# Patient Record
Sex: Female | Born: 1935
Health system: Southern US, Community
[De-identification: ages and names within clinical notes are randomized; demographics above are authoritative.]

## PROBLEM LIST (undated history)

## (undated) DIAGNOSIS — M810 Age-related osteoporosis without current pathological fracture: Secondary | ICD-10-CM

## (undated) DIAGNOSIS — J45909 Unspecified asthma, uncomplicated: Secondary | ICD-10-CM

## (undated) DIAGNOSIS — M25511 Pain in right shoulder: Secondary | ICD-10-CM

## (undated) DIAGNOSIS — T7840XA Allergy, unspecified, initial encounter: Secondary | ICD-10-CM

## (undated) DIAGNOSIS — C801 Malignant (primary) neoplasm, unspecified: Secondary | ICD-10-CM

## (undated) DIAGNOSIS — S43439A Superior glenoid labrum lesion of unspecified shoulder, initial encounter: Secondary | ICD-10-CM

## (undated) DIAGNOSIS — H9190 Unspecified hearing loss, unspecified ear: Secondary | ICD-10-CM

## (undated) DIAGNOSIS — E039 Hypothyroidism, unspecified: Secondary | ICD-10-CM

## (undated) DIAGNOSIS — Z853 Personal history of malignant neoplasm of breast: Secondary | ICD-10-CM

## (undated) HISTORY — PX: MASTECTOMY: SHX3

## (undated) HISTORY — DX: Allergy, unspecified, initial encounter: T78.40XA

## (undated) HISTORY — PX: CATARACT EXTRACTION, BILATERAL: SHX1313

## (undated) HISTORY — DX: Age-related osteoporosis without current pathological fracture: M81.0

---

## 1998-12-02 ENCOUNTER — Other Ambulatory Visit: Admission: RE | Admit: 1998-12-02 | Discharge: 1998-12-02 | Payer: Self-pay | Admitting: *Deleted

## 1999-11-17 ENCOUNTER — Ambulatory Visit (HOSPITAL_COMMUNITY): Admission: RE | Admit: 1999-11-17 | Discharge: 1999-11-17 | Payer: Self-pay | Admitting: Family Medicine

## 1999-11-17 ENCOUNTER — Encounter: Payer: Self-pay | Admitting: Family Medicine

## 1999-12-06 ENCOUNTER — Other Ambulatory Visit: Admission: RE | Admit: 1999-12-06 | Discharge: 1999-12-06 | Payer: Self-pay | Admitting: Family Medicine

## 1999-12-16 ENCOUNTER — Encounter: Payer: Self-pay | Admitting: Family Medicine

## 1999-12-16 ENCOUNTER — Ambulatory Visit (HOSPITAL_COMMUNITY): Admission: RE | Admit: 1999-12-16 | Discharge: 1999-12-16 | Payer: Self-pay | Admitting: Family Medicine

## 1999-12-19 ENCOUNTER — Encounter (INDEPENDENT_AMBULATORY_CARE_PROVIDER_SITE_OTHER): Payer: Self-pay

## 1999-12-19 ENCOUNTER — Ambulatory Visit (HOSPITAL_COMMUNITY): Admission: RE | Admit: 1999-12-19 | Discharge: 1999-12-19 | Payer: Self-pay | Admitting: Surgery

## 1999-12-19 HISTORY — PX: OTHER SURGICAL HISTORY: SHX169

## 2000-03-20 ENCOUNTER — Encounter: Payer: Self-pay | Admitting: Family Medicine

## 2000-03-20 ENCOUNTER — Ambulatory Visit (HOSPITAL_COMMUNITY): Admission: RE | Admit: 2000-03-20 | Discharge: 2000-03-20 | Payer: Self-pay | Admitting: Family Medicine

## 2002-01-28 ENCOUNTER — Encounter (HOSPITAL_COMMUNITY): Payer: Self-pay | Admitting: Oncology

## 2002-01-28 ENCOUNTER — Ambulatory Visit (HOSPITAL_COMMUNITY): Admission: RE | Admit: 2002-01-28 | Discharge: 2002-01-28 | Payer: Self-pay | Admitting: Oncology

## 2003-05-21 ENCOUNTER — Ambulatory Visit (HOSPITAL_COMMUNITY): Admission: RE | Admit: 2003-05-21 | Discharge: 2003-05-21 | Payer: Self-pay | Admitting: Gastroenterology

## 2005-03-08 ENCOUNTER — Ambulatory Visit: Payer: Self-pay | Admitting: Oncology

## 2006-03-06 ENCOUNTER — Ambulatory Visit: Payer: Self-pay | Admitting: Oncology

## 2007-03-11 ENCOUNTER — Ambulatory Visit: Payer: Self-pay | Admitting: Oncology

## 2007-03-14 LAB — CBC WITH DIFFERENTIAL/PLATELET
Basophils Absolute: 0 10*3/uL (ref 0.0–0.1)
Eosinophils Absolute: 0.1 10*3/uL (ref 0.0–0.5)
HGB: 12.9 g/dL (ref 11.6–15.9)
LYMPH%: 40.4 % (ref 14.0–48.0)
MCV: 85.5 fL (ref 81.0–101.0)
MONO#: 0.5 10*3/uL (ref 0.1–0.9)
MONO%: 8.8 % (ref 0.0–13.0)
NEUT#: 2.8 10*3/uL (ref 1.5–6.5)
Platelets: 233 10*3/uL (ref 145–400)
WBC: 5.7 10*3/uL (ref 3.9–10.0)

## 2007-03-15 LAB — LACTATE DEHYDROGENASE: LDH: 184 U/L (ref 94–250)

## 2007-03-15 LAB — COMPREHENSIVE METABOLIC PANEL
Albumin: 4.1 g/dL (ref 3.5–5.2)
Alkaline Phosphatase: 46 U/L (ref 39–117)
BUN: 18 mg/dL (ref 6–23)
CO2: 27 mEq/L (ref 19–32)
Glucose, Bld: 68 mg/dL — ABNORMAL LOW (ref 70–99)
Potassium: 4.6 mEq/L (ref 3.5–5.3)
Sodium: 142 mEq/L (ref 135–145)
Total Protein: 6.7 g/dL (ref 6.0–8.3)

## 2007-03-17 ENCOUNTER — Emergency Department (HOSPITAL_COMMUNITY): Admission: EM | Admit: 2007-03-17 | Discharge: 2007-03-17 | Payer: Self-pay | Admitting: Family Medicine

## 2007-06-13 HISTORY — PX: OTHER SURGICAL HISTORY: SHX169

## 2007-12-24 ENCOUNTER — Emergency Department (HOSPITAL_COMMUNITY): Admission: EM | Admit: 2007-12-24 | Discharge: 2007-12-24 | Payer: Self-pay | Admitting: Emergency Medicine

## 2007-12-25 ENCOUNTER — Encounter: Admission: RE | Admit: 2007-12-25 | Discharge: 2007-12-25 | Payer: Self-pay | Admitting: Orthopedic Surgery

## 2008-03-18 ENCOUNTER — Ambulatory Visit: Payer: Self-pay | Admitting: Oncology

## 2008-03-20 LAB — CBC WITH DIFFERENTIAL/PLATELET
Basophils Absolute: 0 10*3/uL (ref 0.0–0.1)
EOS%: 1.7 % (ref 0.0–7.0)
HCT: 39.1 % (ref 34.8–46.6)
HGB: 13.3 g/dL (ref 11.6–15.9)
LYMPH%: 44.4 % (ref 14.0–48.0)
MCH: 29.5 pg (ref 26.0–34.0)
MCV: 86.6 fL (ref 81.0–101.0)
MONO%: 7.4 % (ref 0.0–13.0)
NEUT%: 46.3 % (ref 39.6–76.8)
Platelets: 229 10*3/uL (ref 145–400)
RDW: 13.3 % (ref 11.3–14.5)

## 2008-03-20 LAB — COMPREHENSIVE METABOLIC PANEL
AST: 22 U/L (ref 0–37)
Alkaline Phosphatase: 48 U/L (ref 39–117)
BUN: 17 mg/dL (ref 6–23)
Creatinine, Ser: 0.93 mg/dL (ref 0.40–1.20)
Glucose, Bld: 74 mg/dL (ref 70–99)
Total Bilirubin: 0.5 mg/dL (ref 0.3–1.2)

## 2008-04-08 ENCOUNTER — Encounter: Admission: RE | Admit: 2008-04-08 | Discharge: 2008-06-11 | Payer: Self-pay | Admitting: Orthopedic Surgery

## 2008-06-12 ENCOUNTER — Encounter: Admission: RE | Admit: 2008-06-12 | Discharge: 2008-09-10 | Payer: Self-pay | Admitting: Orthopedic Surgery

## 2009-03-16 ENCOUNTER — Ambulatory Visit: Payer: Self-pay | Admitting: Oncology

## 2009-03-18 LAB — CBC WITH DIFFERENTIAL/PLATELET
BASO%: 0.1 % (ref 0.0–2.0)
EOS%: 0.6 % (ref 0.0–7.0)
LYMPH%: 44 % (ref 14.0–49.7)
MCHC: 33.8 g/dL (ref 31.5–36.0)
MONO#: 0.5 10*3/uL (ref 0.1–0.9)
Platelets: 215 10*3/uL (ref 145–400)
RBC: 4.47 10*6/uL (ref 3.70–5.45)
WBC: 6.8 10*3/uL (ref 3.9–10.3)
lymph#: 3 10*3/uL (ref 0.9–3.3)
nRBC: 0 % (ref 0–0)

## 2009-03-18 LAB — COMPREHENSIVE METABOLIC PANEL
ALT: 17 U/L (ref 0–35)
AST: 23 U/L (ref 0–37)
Alkaline Phosphatase: 47 U/L (ref 39–117)
Calcium: 10 mg/dL (ref 8.4–10.5)
Chloride: 106 mEq/L (ref 96–112)
Creatinine, Ser: 0.99 mg/dL (ref 0.40–1.20)

## 2009-11-16 ENCOUNTER — Encounter: Admission: RE | Admit: 2009-11-16 | Discharge: 2009-11-16 | Payer: Self-pay | Admitting: Family Medicine

## 2010-03-16 ENCOUNTER — Ambulatory Visit: Payer: Self-pay | Admitting: Oncology

## 2010-03-18 LAB — COMPREHENSIVE METABOLIC PANEL
Albumin: 4.2 g/dL (ref 3.5–5.2)
CO2: 26 mEq/L (ref 19–32)
Chloride: 106 mEq/L (ref 96–112)
Glucose, Bld: 84 mg/dL (ref 70–99)
Potassium: 4.2 mEq/L (ref 3.5–5.3)
Sodium: 142 mEq/L (ref 135–145)
Total Protein: 6.5 g/dL (ref 6.0–8.3)

## 2010-03-18 LAB — LACTATE DEHYDROGENASE: LDH: 200 U/L (ref 94–250)

## 2010-03-18 LAB — CBC WITH DIFFERENTIAL/PLATELET
Eosinophils Absolute: 0.1 10*3/uL (ref 0.0–0.5)
MONO#: 0.7 10*3/uL (ref 0.1–0.9)
NEUT#: 4.5 10*3/uL (ref 1.5–6.5)
Platelets: 230 10*3/uL (ref 145–400)
RBC: 4.44 10*6/uL (ref 3.70–5.45)
RDW: 13.8 % (ref 11.2–14.5)
WBC: 7.6 10*3/uL (ref 3.9–10.3)

## 2010-10-12 ENCOUNTER — Other Ambulatory Visit: Payer: Self-pay | Admitting: Internal Medicine

## 2010-10-12 ENCOUNTER — Other Ambulatory Visit (HOSPITAL_COMMUNITY): Payer: Self-pay | Admitting: Oncology

## 2010-10-12 DIAGNOSIS — Z1231 Encounter for screening mammogram for malignant neoplasm of breast: Secondary | ICD-10-CM

## 2010-10-28 NOTE — Op Note (Signed)
NAME:  Sarah Boyd, Sarah Boyd                        ACCOUNT NO.:  [id number]   MEDICAL RECORD NO.:  [id number]                   PATIENT TYPE:  AMB   LOCATION:  ENDO                                 FACILITY:  [facility]   PHYSICIAN:  John C. Madilyn Fireman, M.D.                 DATE OF BIRTH:  1930-03-20   DATE OF PROCEDURE:  05/21/2003  DATE OF DISCHARGE:                                 OPERATIVE REPORT   PROCEDURE:  Colonoscopy.   INDICATION FOR PROCEDURE:  Personal history of breast cancer and a family  history of colon cancer in a second degree relative.   DESCRIPTION OF PROCEDURE:  The patient was placed in the left lateral  decubitus position and placed on the pulse monitor with continuous low-flow  oxygen delivered by nasal cannula.  She was sedated with 50 mcg IV fentanyl  and 4 mg IV Versed.  The Olympus video colonoscope was inserted into the  rectum and advanced to the cecum, confirmed by transillumination at  McBurney's point and visualization of the ileocecal valve and appendiceal  orifice.  The prep was excellent.  The cecum, ascending, transverse,  descending, and sigmoid colon all appeared normal with no masses, polyps,  diverticula, or other mucosal abnormalities.  The rectum likewise appeared  normal and retroflex view of the anus revealed no obvious internal  hemorrhoids.  The scope was then withdrawn and the patient returned to the  recovery room in stable condition.  She tolerated the procedure well, and  there were no immediate complications.   IMPRESSION:  Normal colonoscopy.   PLAN:  Based on her risk factors, will repeat study in five years.                                               John C. Madilyn Fireman, M.D.    JCH/MEDQ  D:  05/21/2003  T:  05/21/2003  Job:  [id number]   cc:   Magnus Sinning. Dimple Casey, M.D.  [address] [place]  [place] [address]  Fax: [phone]

## 2010-10-28 NOTE — Op Note (Signed)
[facility]  Patient:    Sarah Boyd, Sarah Boyd                       MRN: [id number] Proc. Date: 12/19/99 Attending:  Abigail Miyamoto, M.D.                           Operative Report  PREOPERATIVE DIAGNOSIS:  Right chest mass.  POSTOPERATIVE DIAGNOSIS:  Right chest mass.  OPERATION PERFORMED:  Excision of right chest mass.  SURGEON:  Abigail Miyamoto, M.D.  ANESTHESIA:  1% lidocaine with epinephrine and 2 mg of IV Versed.  ESTIMATED BLOOD LOSS:  Minimal.  HISTORY:  Sarah Boyd is a pleasant lady who is status post a right mastectomy back in 1991 for invasive breast cancer in which she had 14 positive nodes.  She received radiation and chemotherapy after this and has since done well.  She was found on physical examination to have a keloid at the lateral aspect of the chest incision which had been followed for some time.  However, due to some change in skin color, her oncologist has requested excision of this area to rule out a recurrence.  DESCRIPTION OF PROCEDURE:  The patient was brought to the minor procedure room, identified as Sarah Boyd.  She was placed supine on the operating table and then anesthesia was induced.  Her right chest was then prepped and draped in the usual sterile fashion.  The skin around the lateral aspect of the old incision was then anesthetized with 1% lidocaine.  An elliptical incision was then made removing the scar tissue at the lateral aspect of the incision.  The entire specimen was then removed with a scalpel and sent to pathology for identification.  Hemostasis was then achieved with the cautery. The subcutaneous layers were closed with interrupted 3-0 Vicryl suture and the skin was closed with running 4-0 Monocryl.  Steri-Strips, gauze and tape were then applied.  The patient tolerated the procedure well.  Sponge, needle and instrument counts were correct at the end of the procedure.  The patient was taken in  stable condition from the operating room back to the recovery room. DD:  12/19/99 TD:  12/19/99 Job: [id number]

## 2010-11-18 ENCOUNTER — Ambulatory Visit: Payer: Self-pay

## 2010-11-28 ENCOUNTER — Ambulatory Visit
Admission: RE | Admit: 2010-11-28 | Discharge: 2010-11-28 | Disposition: A | Discharge status: Home / Self Care | Payer: Medicare Other | Source: Ambulatory Visit | Attending: Oncology | Admitting: Oncology

## 2010-11-28 ENCOUNTER — Other Ambulatory Visit (HOSPITAL_COMMUNITY): Payer: Self-pay | Admitting: Oncology

## 2010-11-28 DIAGNOSIS — Z1231 Encounter for screening mammogram for malignant neoplasm of breast: Secondary | ICD-10-CM

## 2011-03-23 LAB — I-STAT 8, (EC8 V) (CONVERTED LAB)
Acid-Base Excess: 1
Bicarbonate: 28.3 — ABNORMAL HIGH
Glucose, Bld: 91
TCO2: 30
pCO2, Ven: 53.5 — ABNORMAL HIGH
pH, Ven: 7.332 — ABNORMAL HIGH

## 2011-03-23 LAB — POCT URINALYSIS DIP (DEVICE)
Glucose, UA: NEGATIVE
Nitrite: NEGATIVE
Operator id: 235561
Protein, ur: NEGATIVE
Specific Gravity, Urine: 1.01
Urobilinogen, UA: 0.2

## 2011-03-23 LAB — POCT I-STAT CREATININE
Creatinine, Ser: 1.2
Operator id: 235561

## 2011-10-24 ENCOUNTER — Other Ambulatory Visit: Payer: Self-pay | Admitting: Oncology

## 2011-10-24 DIAGNOSIS — Z9011 Acquired absence of right breast and nipple: Secondary | ICD-10-CM

## 2011-10-24 DIAGNOSIS — Z1231 Encounter for screening mammogram for malignant neoplasm of breast: Secondary | ICD-10-CM

## 2011-11-28 ENCOUNTER — Other Ambulatory Visit: Payer: Self-pay | Admitting: Orthopedic Surgery

## 2011-11-29 ENCOUNTER — Ambulatory Visit: Payer: Medicare Other

## 2011-11-29 ENCOUNTER — Encounter (HOSPITAL_BASED_OUTPATIENT_CLINIC_OR_DEPARTMENT_OTHER): Payer: Self-pay | Admitting: *Deleted

## 2011-11-29 NOTE — Progress Notes (Signed)
NPO AFTER MN. ARRIVES AT 0830. NEEDS HG , CXR,  AND EKG. WILL TAKE LEVOTHYROXINE AND DO ADVAIR AM OF SURG. W/ SIP OF WATER. WILL BRING RESCUE INHALER.

## 2011-11-30 ENCOUNTER — Ambulatory Visit
Admission: RE | Admit: 2011-11-30 | Discharge: 2011-11-30 | Disposition: A | Discharge status: Home / Self Care | Payer: Medicare Other | Source: Ambulatory Visit | Attending: Oncology | Admitting: Oncology

## 2011-11-30 DIAGNOSIS — Z9011 Acquired absence of right breast and nipple: Secondary | ICD-10-CM

## 2011-11-30 DIAGNOSIS — Z1231 Encounter for screening mammogram for malignant neoplasm of breast: Secondary | ICD-10-CM

## 2011-12-01 ENCOUNTER — Telehealth: Payer: Self-pay

## 2011-12-01 NOTE — Telephone Encounter (Signed)
S/w pt that her mammogram showed something suspicious and it would be faxed to her PCP.

## 2011-12-01 NOTE — Telephone Encounter (Signed)
Mammogram faxed to [facility].

## 2011-12-04 ENCOUNTER — Other Ambulatory Visit: Payer: Self-pay | Admitting: Oncology

## 2011-12-04 DIAGNOSIS — R928 Other abnormal and inconclusive findings on diagnostic imaging of breast: Secondary | ICD-10-CM

## 2011-12-05 ENCOUNTER — Ambulatory Visit
Admission: RE | Admit: 2011-12-05 | Discharge: 2011-12-05 | Disposition: A | Discharge status: Home / Self Care | Payer: Medicare Other | Source: Ambulatory Visit | Attending: Oncology | Admitting: Oncology

## 2011-12-05 DIAGNOSIS — R928 Other abnormal and inconclusive findings on diagnostic imaging of breast: Secondary | ICD-10-CM

## 2011-12-06 ENCOUNTER — Encounter (HOSPITAL_BASED_OUTPATIENT_CLINIC_OR_DEPARTMENT_OTHER)
Admission: RE | Disposition: A | Discharge status: Home / Self Care | Payer: Self-pay | Source: Ambulatory Visit | Attending: Orthopedic Surgery

## 2011-12-06 ENCOUNTER — Encounter (HOSPITAL_BASED_OUTPATIENT_CLINIC_OR_DEPARTMENT_OTHER): Payer: Self-pay | Admitting: Anesthesiology

## 2011-12-06 ENCOUNTER — Encounter (HOSPITAL_BASED_OUTPATIENT_CLINIC_OR_DEPARTMENT_OTHER): Payer: Self-pay | Admitting: *Deleted

## 2011-12-06 ENCOUNTER — Ambulatory Visit (HOSPITAL_BASED_OUTPATIENT_CLINIC_OR_DEPARTMENT_OTHER): Payer: Medicare Other | Admitting: Anesthesiology

## 2011-12-06 ENCOUNTER — Ambulatory Visit (HOSPITAL_BASED_OUTPATIENT_CLINIC_OR_DEPARTMENT_OTHER)
Admission: RE | Admit: 2011-12-06 | Discharge: 2011-12-06 | Disposition: A | Discharge status: Home / Self Care | Payer: Medicare Other | Source: Ambulatory Visit | Attending: Orthopedic Surgery | Admitting: Orthopedic Surgery

## 2011-12-06 DIAGNOSIS — M719 Bursopathy, unspecified: Secondary | ICD-10-CM | POA: Insufficient documentation

## 2011-12-06 DIAGNOSIS — M67919 Unspecified disorder of synovium and tendon, unspecified shoulder: Secondary | ICD-10-CM | POA: Insufficient documentation

## 2011-12-06 DIAGNOSIS — Z9010 Acquired absence of unspecified breast and nipple: Secondary | ICD-10-CM | POA: Insufficient documentation

## 2011-12-06 DIAGNOSIS — Z79899 Other long term (current) drug therapy: Secondary | ICD-10-CM | POA: Insufficient documentation

## 2011-12-06 DIAGNOSIS — M19019 Primary osteoarthritis, unspecified shoulder: Secondary | ICD-10-CM | POA: Insufficient documentation

## 2011-12-06 DIAGNOSIS — Z853 Personal history of malignant neoplasm of breast: Secondary | ICD-10-CM | POA: Insufficient documentation

## 2011-12-06 DIAGNOSIS — M24119 Other articular cartilage disorders, unspecified shoulder: Secondary | ICD-10-CM | POA: Insufficient documentation

## 2011-12-06 DIAGNOSIS — E039 Hypothyroidism, unspecified: Secondary | ICD-10-CM | POA: Insufficient documentation

## 2011-12-06 HISTORY — DX: Personal history of malignant neoplasm of breast: Z85.3

## 2011-12-06 HISTORY — DX: Pain in right shoulder: M25.511

## 2011-12-06 HISTORY — DX: Unspecified asthma, uncomplicated: J45.909

## 2011-12-06 HISTORY — PX: SHOULDER ARTHROSCOPY: SHX128

## 2011-12-06 HISTORY — DX: Superior glenoid labrum lesion of unspecified shoulder, initial encounter: S43.439A

## 2011-12-06 HISTORY — DX: Hypothyroidism, unspecified: E03.9

## 2011-12-06 HISTORY — DX: Unspecified hearing loss, unspecified ear: H91.90

## 2011-12-06 SURGERY — ARTHROSCOPY, SHOULDER
Anesthesia: General | Site: Shoulder | Laterality: Right | Wound class: Clean

## 2011-12-06 MED ORDER — ALENDRONATE SODIUM 70 MG PO TABS: 70.0000 mg | ORAL_TABLET | ORAL | Status: DC

## 2011-12-06 MED ORDER — LIDOCAINE HCL (CARDIAC) 20 MG/ML IV SOLN
INTRAVENOUS | Status: DC | PRN
  Administered 2011-12-06: 60 mg via INTRAVENOUS

## 2011-12-06 MED ORDER — DEXAMETHASONE SODIUM PHOSPHATE 4 MG/ML IJ SOLN
INTRAMUSCULAR | Status: DC | PRN
  Administered 2011-12-06: 5 mg via INTRAVENOUS

## 2011-12-06 MED ORDER — POVIDONE-IODINE 7.5 % EX SOLN: Freq: Once | CUTANEOUS | Status: DC

## 2011-12-06 MED ORDER — SODIUM CHLORIDE 0.9 % IR SOLN
Status: DC | PRN
  Administered 2011-12-06: 6000 mL via INTRAVESICAL

## 2011-12-06 MED ORDER — FENTANYL CITRATE 0.05 MG/ML IJ SOLN
INTRAMUSCULAR | Status: DC | PRN
  Administered 2011-12-06: 50 ug via INTRAVENOUS

## 2011-12-06 MED ORDER — EPINEPHRINE HCL 1 MG/ML IJ SOLN
INTRAMUSCULAR | Status: DC | PRN
  Administered 2011-12-06: 2 mg

## 2011-12-06 MED ORDER — ROPIVACAINE HCL 5 MG/ML IJ SOLN
INTRAMUSCULAR | Status: DC | PRN
  Administered 2011-12-06: 20 mL via EPIDURAL

## 2011-12-06 MED ORDER — PROPOFOL 10 MG/ML IV EMUL
INTRAVENOUS | Status: DC | PRN
  Administered 2011-12-06: 100 mg via INTRAVENOUS

## 2011-12-06 MED ORDER — SODIUM CHLORIDE 0.9 % IV SOLN
10.0000 mg | INTRAVENOUS | Status: DC | PRN
  Administered 2011-12-06: 20 ug/min via INTRAVENOUS

## 2011-12-06 MED ORDER — MONTELUKAST SODIUM 10 MG PO TABS: 10.0000 mg | ORAL_TABLET | Freq: Every day | ORAL | Status: DC

## 2011-12-06 MED ORDER — PHENYLEPHRINE HCL 10 MG/ML IJ SOLN
INTRAMUSCULAR | Status: DC | PRN
  Administered 2011-12-06 (×5): 40 ug via INTRAVENOUS

## 2011-12-06 MED ORDER — BUPIVACAINE-EPINEPHRINE 0.5% -1:200000 IJ SOLN
INTRAMUSCULAR | Status: DC | PRN
  Administered 2011-12-06: 10 mL

## 2011-12-06 MED ORDER — FLUTICASONE-SALMETEROL 100-50 MCG/DOSE IN AEPB: 1.0000 | INHALATION_SPRAY | Freq: Two times a day (BID) | RESPIRATORY_TRACT | Status: DC | PRN

## 2011-12-06 MED ORDER — METHOCARBAMOL 500 MG PO TABS: 500.0000 mg | ORAL_TABLET | Freq: Four times a day (QID) | ORAL | Status: AC | PRN

## 2011-12-06 MED ORDER — PROMETHAZINE HCL 25 MG/ML IJ SOLN: 6.2500 mg | INTRAMUSCULAR | Status: DC | PRN

## 2011-12-06 MED ORDER — SODIUM CHLORIDE 0.9 % IR SOLN
Status: DC | PRN
  Administered 2011-12-06: 11:00:00

## 2011-12-06 MED ORDER — MIDAZOLAM HCL 2 MG/2ML IJ SOLN
1.0000 mg | Freq: Once | INTRAMUSCULAR | Status: AC
  Administered 2011-12-06: 1 mg via INTRAVENOUS

## 2011-12-06 MED ORDER — ONDANSETRON HCL 4 MG/2ML IJ SOLN
INTRAMUSCULAR | Status: DC | PRN
  Administered 2011-12-06: 4 mg via INTRAVENOUS

## 2011-12-06 MED ORDER — LACTATED RINGERS IV SOLN
INTRAVENOUS | Status: DC
  Administered 2011-12-06 (×2): via INTRAVENOUS

## 2011-12-06 MED ORDER — FENTANYL CITRATE 0.05 MG/ML IJ SOLN
25.0000 ug | INTRAMUSCULAR | Status: DC | PRN
  Administered 2011-12-06: 25 ug via INTRAVENOUS

## 2011-12-06 MED ORDER — SUCCINYLCHOLINE CHLORIDE 20 MG/ML IJ SOLN
INTRAMUSCULAR | Status: DC | PRN
  Administered 2011-12-06: 60 mg via INTRAVENOUS

## 2011-12-06 MED ORDER — FENTANYL CITRATE 0.05 MG/ML IJ SOLN: 25.0000 ug | Freq: Once | INTRAMUSCULAR | Status: DC

## 2011-12-06 MED ORDER — LEVOTHYROXINE SODIUM 25 MCG PO TABS: 25.0000 ug | ORAL_TABLET | Freq: Every morning | ORAL | Status: DC

## 2011-12-06 MED ORDER — OXYCODONE-ACETAMINOPHEN 5-325 MG PO TABS: 1.0000 | ORAL_TABLET | ORAL | Status: AC | PRN

## 2011-12-06 MED ORDER — LACTATED RINGERS IV SOLN: INTRAVENOUS | Status: DC

## 2011-12-06 SURGICAL SUPPLY — 77 items
APL SKNCLS STERI-STRIP NONHPOA (GAUZE/BANDAGES/DRESSINGS)
BENZOIN TINCTURE PRP APPL 2/3 (GAUZE/BANDAGES/DRESSINGS) IMPLANT
BLADE 4.2CUDA (BLADE) ×2 IMPLANT
BLADE CUDA 4.2 (BLADE) IMPLANT
BLADE CUDA 5.5 (BLADE) IMPLANT
BLADE CUDA SHAVER 3.5 (BLADE) IMPLANT
BLADE CUTTER GATOR 3.5 (BLADE) IMPLANT
BLADE FLAT COURSE (BLADE) IMPLANT
BLADE GREAT WHITE 4.2 (BLADE) IMPLANT
BLADE SURG 10 STRL SS (BLADE) ×2 IMPLANT
BLADE SURG 15 STRL LF DISP TIS (BLADE) ×1 IMPLANT
BLADE SURG 15 STRL SS (BLADE) ×2
BUR OVAL 4.0 (BURR) ×2 IMPLANT
CANISTER SUCT LVC 12 LTR MEDI- (MISCELLANEOUS) ×4 IMPLANT
CANISTER SUCTION 1200CC (MISCELLANEOUS) ×2 IMPLANT
CLOTH BEACON ORANGE TIMEOUT ST (SAFETY) ×2 IMPLANT
DRAPE LG THREE QUARTER DISP (DRAPES) ×4 IMPLANT
DRAPE SHOULDER BEACH CHAIR (DRAPES) ×2 IMPLANT
DRAPE U-SHAPE 47X51 STRL (DRAPES) ×2 IMPLANT
DRSG ADAPTIC 3X8 NADH LF (GAUZE/BANDAGES/DRESSINGS) ×1 IMPLANT
DRSG EMULSION OIL 3X3 NADH (GAUZE/BANDAGES/DRESSINGS) ×1 IMPLANT
DRSG PAD ABDOMINAL 8X10 ST (GAUZE/BANDAGES/DRESSINGS) ×2 IMPLANT
DURAPREP 26ML APPLICATOR (WOUND CARE) ×2 IMPLANT
ELECT MENISCUS 165MM 90D (ELECTRODE) IMPLANT
ELECT REM PT RETURN 9FT ADLT (ELECTROSURGICAL) ×2
ELECTRODE REM PT RTRN 9FT ADLT (ELECTROSURGICAL) ×1 IMPLANT
GLOVE BIOGEL PI IND STRL 8 (GLOVE) ×1 IMPLANT
GLOVE BIOGEL PI INDICATOR 8 (GLOVE) ×1
GLOVE ECLIPSE 6.5 STRL STRAW (GLOVE) ×1 IMPLANT
GLOVE ECLIPSE 8.0 STRL XLNG CF (GLOVE) ×4 IMPLANT
GLOVE INDICATOR 6.5 STRL GRN (GLOVE) ×1 IMPLANT
GLOVE INDICATOR 8.0 STRL GRN (GLOVE) ×4 IMPLANT
GOWN PREVENTION PLUS LG XLONG (DISPOSABLE) ×2 IMPLANT
GOWN STRL REIN XL XLG (GOWN DISPOSABLE) ×4 IMPLANT
GOWN SURGICAL LARGE (GOWNS) ×1 IMPLANT
IV NS IRRIG 3000ML ARTHROMATIC (IV SOLUTION) ×4 IMPLANT
NDL 1/2 CIR CATGUT .05X1.09 (NEEDLE) IMPLANT
NDL HYPO 18GX1.5 BLUNT FILL (NEEDLE) ×1 IMPLANT
NDL SAFETY ECLIPSE 18X1.5 (NEEDLE) ×1 IMPLANT
NEEDLE 1/2 CIR CATGUT .05X1.09 (NEEDLE) IMPLANT
NEEDLE HYPO 18GX1.5 BLUNT FILL (NEEDLE) ×2 IMPLANT
NEEDLE HYPO 18GX1.5 SHARP (NEEDLE) ×2
NEEDLE HYPO 22GX1.5 SAFETY (NEEDLE) ×2 IMPLANT
NS IRRIG 500ML POUR BTL (IV SOLUTION) ×2 IMPLANT
PACK ARTHROSCOPY DSU (CUSTOM PROCEDURE TRAY) ×2 IMPLANT
PACK BASIN DAY SURGERY FS (CUSTOM PROCEDURE TRAY) ×2 IMPLANT
PENCIL BUTTON HOLSTER BLD 10FT (ELECTRODE) IMPLANT
SET ARTHROSCOPY TUBING (MISCELLANEOUS) ×2
SET ARTHROSCOPY TUBING LN (MISCELLANEOUS) ×1 IMPLANT
SLING ARM IMMOBILIZER LRG (SOFTGOODS) IMPLANT
SLING ARM IMMOBILIZER MED (SOFTGOODS) ×1 IMPLANT
SPONGE GAUZE 4X4 12PLY (GAUZE/BANDAGES/DRESSINGS) ×2 IMPLANT
SPONGE SURGIFOAM ABS GEL 100 (HEMOSTASIS) IMPLANT
STAPLER VISISTAT 35W (STAPLE) ×2 IMPLANT
STRIP CLOSURE SKIN 1/2X4 (GAUZE/BANDAGES/DRESSINGS) IMPLANT
SUCTION FRAZIER TIP 10 FR DISP (SUCTIONS) ×2 IMPLANT
SUT BONE WAX W31G (SUTURE) IMPLANT
SUT ETHIBOND GREEN BRAID 0S 4 (SUTURE) IMPLANT
SUT ETHIBOND NAB CT1 #1 30IN (SUTURE) IMPLANT
SUT ETHILON 4 0 PS 2 18 (SUTURE) IMPLANT
SUT VIC AB 0 CT1 36 (SUTURE) IMPLANT
SUT VIC AB 1 CT1 36 (SUTURE) ×4 IMPLANT
SUT VIC AB 2-0 CT1 27 (SUTURE) ×4
SUT VIC AB 2-0 CT1 TAPERPNT 27 (SUTURE) ×2 IMPLANT
SUT VIC AB 3-0 CT1 27 (SUTURE)
SUT VIC AB 3-0 CT1 TAPERPNT 27 (SUTURE) IMPLANT
SUT VIC AB 3-0 SH 27 (SUTURE)
SUT VIC AB 3-0 SH 27X BRD (SUTURE) IMPLANT
SUT VICRYL 4-0 PS2 18IN ABS (SUTURE) IMPLANT
SYR BULB IRRIGATION 50ML (SYRINGE) ×2 IMPLANT
SYRINGE 10CC LL (SYRINGE) ×2 IMPLANT
TAPE CLOTH SURG 6X10 WHT LF (GAUZE/BANDAGES/DRESSINGS) ×1 IMPLANT
TAPE HYPAFIX 6X30 (GAUZE/BANDAGES/DRESSINGS) IMPLANT
TOWEL OR 17X24 6PK STRL BLUE (TOWEL DISPOSABLE) ×2 IMPLANT
TUBE CONNECTING 12X1/4 (SUCTIONS) ×2 IMPLANT
WAND 90 DEG TURBOVAC W/CORD (SURGICAL WAND) ×2 IMPLANT
WATER STERILE IRR 500ML POUR (IV SOLUTION) ×2 IMPLANT

## 2011-12-06 NOTE — Discharge Instructions (Signed)
Wear sling for comfort, use ice as needed. Call if problems. Post Anesthesia Home Care Instructions  Activity: Get plenty of rest for the remainder of the day. A responsible adult should stay with you for 24 hours following the procedure.  For the next 24 hours, DO NOT: -Drive a car -Advertising copywriter -Drink alcoholic beverages -Take any medication unless instructed by your physician -Make any legal decisions or sign important papers.  Meals: Start with liquid foods such as gelatin or soup. Progress to regular foods as tolerated. Avoid greasy, spicy, heavy foods. If nausea and/or vomiting occur, drink only clear liquids until the nausea and/or vomiting subsides. Call your physician if vomiting continues.  Special Instructions/Symptoms: Your throat may feel dry or sore from the anesthesia or the breathing tube placed in your throat during surgery. If this causes discomfort, gargle with warm salt water. The discomfort should disappear within 24 hours.  Regional Anesthesia Blocks  1. Numbness or the inability to move the "blocked" extremity may last from 3-48 hours after placement. The length of time depends on the medication injected and your individual response to the medication. If the numbness is not going away after 48 hours, call your surgeon.  2. The extremity that is blocked will need to be protected until the numbness is gone and the  Strength has returned. Because you cannot feel it, you will need to take extra care to avoid injury. Because it may be weak, you may have difficulty moving it or using it. You may not know what position it is in without looking at it while the block is in effect.  3. For blocks in the legs and feet, returning to weight bearing and walking needs to be done carefully. You will need to wait until the numbness is entirely gone and the strength has returned. You should be able to move your leg and foot normally before you try and bear weight or walk. You will  need someone to be with you when you first try to ensure you do not fall and possibly risk injury.  4. Bruising and tenderness at the needle site are common side effects and will resolve in a few days.  5. Persistent numbness or new problems with movement should be communicated to the surgeon or the [facility] [phone] [facility] [phone]).

## 2011-12-06 NOTE — Anesthesia Procedure Notes (Addendum)
Anesthesia Regional Block:  Interscalene brachial plexus block  Pre-Anesthetic Checklist: ,, timeout performed, Correct Patient, Correct Site, Correct Laterality, Correct Procedure, Correct Position, site marked, Risks and benefits discussed,  Surgical consent,  Pre-op evaluation,  At surgeon's request and post-op pain management   Prep: chloraprep       Needles:  Injection technique: Single-shot  Needle Type: Stimiplex     Needle Length: 5cm 5 cm     Additional Needles:  Procedures: ultrasound guided Interscalene brachial plexus block Narrative:  Start time: 12/06/2011 9:37 AM End time: 12/06/2011 9:45 AM Injection made incrementally with aspirations every 5 mL.  Performed by: Personally  Anesthesiologist: A Fortune MD  Additional Notes: Risks, benefits and alternative to block explained extensively.  Patient tolerated procedure well, without complications.  Interscalene brachial plexus block Procedure Name: Intubation Date/Time: 12/06/2011 10:13 AM Performed by: Norva Pavlov Pre-anesthesia Checklist: Patient identified, Emergency Drugs available, Suction available and Patient being monitored Patient Re-evaluated:Patient Re-evaluated prior to inductionOxygen Delivery Method: Circle System Utilized Preoxygenation: Pre-oxygenation with 100% oxygen Intubation Type: IV induction Ventilation: Mask ventilation without difficulty Laryngoscope Size: Mac and 3 Tube type: Oral Tube size: 6.5 mm Number of attempts: 1 Airway Equipment and Method: stylet,  oral airway and LTA kit utilized Placement Confirmation: ETT inserted through vocal cords under direct vision,  positive ETCO2 and breath sounds checked- equal and bilateral Tube secured with: Tape Dental Injury: Teeth and Oropharynx as per pre-operative assessment  Comments: LTA 80 mg lidocaine

## 2011-12-06 NOTE — H&P (Signed)
Sarah Boyd is an 78 y.o. female.   Chief Complaint: painful rt shoulder HPI:MRI demonstrates labral tear and partial rotator cuff tear  Past Medical History  Diagnosis Date  . Labral tear of shoulder RIGHT SHOULDER  . Asthma   . Hypothyroidism   . History of breast cancer S/P RIGHT MASTECTOMY AND CHEMO 21 YRS AGO (APPROX.  1990)    NO RECURRENCE  . Right shoulder pain   . HOH (hard of hearing) NO AIDS    Past Surgical History  Procedure Date  . Excision of right chest mass 12-19-1999    BENIGN  . Mastectomy 1990  (APPROX.)    RIGHT BREAST CANCER -- NO RECURRENCE  . Orif bilateral wrist 2009  . Cataract extraction, bilateral     History reviewed. No pertinent family history. Social History:  reports that she has never smoked. She has never used smokeless tobacco. She reports that she does not drink alcohol or use illicit drugs.  Allergies:  Allergies  Allergen Reactions  . Benadryl (Diphenhydramine Hcl) Other (See Comments)    ALTERED MENTAL STATIS  . Chocolate     Medications Prior to Admission  Medication Sig Dispense Refill  . alendronate (FOSAMAX) 70 MG tablet Take 70 mg by mouth every 7 (seven) days. Take with a full glass of water on an empty stomach.      . Fluticasone-Salmeterol (ADVAIR DISKUS IN) Inhale 1 puff into the lungs 2 (two) times daily.      Marland Kitchen levothyroxine (SYNTHROID, LEVOTHROID) 25 MCG tablet Take 25 mcg by mouth every morning.      . montelukast (SINGULAIR) 10 MG tablet Take 10 mg by mouth at bedtime.      . ALBUTEROL IN Inhale into the lungs as needed.        No results found for this or any previous visit (from the past 48 hour(s)). [employer]  12/05/2011  *RADIOLOGY REPORT*  Clinical Data:  The patient returns for evaluation of a possible asymmetry in the left breast noted on recent screening study dated 11/30/2011.  The patient underwent right mastectomy for breast cancer in 1991.  DIGITAL DIAGNOSTIC LEFT LIMITED MAMMOGRAM WITH  CAD  Comparison:  11/28/2010, 11/16/2009  Findings:  Additional views demonstrate no persistent mass or distortion in the left upper outer quadrant. Mammographic images were processed with CAD.  IMPRESSION: No persistent worrisome abnormality upon additional imaging of the left breast.  RECOMMENDATION: Yearly screening mammography is suggested.  BI-RADS CATEGORY 1:  Negative.  Original Report Authenticated By: [name], M.D.    ROS  Blood pressure 135/65, pulse 79, temperature 97.4 F (36.3 C), temperature source Oral, resp. rate 18, height 5\' 1"  (1.549 m), weight 44.453 kg (98 lb), SpO2 97.00%. Physical Exam  Constitutional: She is oriented to person, place, and time. She appears well-developed and well-nourished.  HENT:  Head: Normocephalic and atraumatic.  Right Ear: External ear normal.  Left Ear: External ear normal.  Nose: Nose normal.  Mouth/Throat: Oropharynx is clear and moist.  Eyes: Conjunctivae and EOM are normal. Pupils are equal, round, and reactive to light.  Neck: Normal range of motion. Neck supple.  Cardiovascular: Normal rate, regular rhythm, normal heart sounds and intact distal pulses.   Respiratory: Effort normal and breath sounds normal.  GI: Soft. Bowel sounds are normal.  Musculoskeletal: Normal range of motion.       She has had an interscalene block  Neurological: She is alert and oriented to person, place,  and time. She has normal reflexes.  Skin: Skin is warm and dry.  Psychiatric: She has a normal mood and affect. Her behavior is normal. Judgment and thought content normal.     Assessment/Plan Painful rt shoulder due to labral and partial rotator cuff tears Rt shoulder arthroscopy with labral debridement and SAD  Willean Schurman P 12/06/2011, 9:51 AM

## 2011-12-06 NOTE — Brief Op Note (Signed)
12/06/2011  11:18 AM  PATIENT:  Sarah Boyd  78 y.o. female  PRE-OPERATIVE DIAGNOSIS:  RIGHT SHOULDER LABRIAL TEAR and partial rotator cuff tear  POST-OPERATIVE DIAGNOSIS:  RIGHT SHOULDER LABRIAL TEAR,partial rotator cuff tear, and glenohumeral arthritis  PROCEDURE:  Procedure(s) (LRB): ARTHROSCOPY SHOULDER (Right) with debridement labrum and subscapularis, subacromial decompression with shaving of rotator cuff  SURGEON:  Surgeon(s) and Role:    * Drucilla Schmidt, MD - Primary  PHYSICIAN ASSISTANT: Mr Idolina Primer [facility]  ASSISTANTS: nurse  ANESTHESIA:   regional and general  EBL:  Total I/O In: 1000 [I.V.:1000] Out: -   BLOOD ADMINISTERED:none  DRAINS: none   LOCAL MEDICATIONS USED:  MARCAINE     SPECIMEN:  No Specimen  DISPOSITION OF SPECIMEN:  PATHOLOGY  COUNTS:  YES  TOURNIQUET:  * No tourniquets in log *  DICTATION: .Other Dictation: Dictation Number [id number]  PLAN OF CARE: Discharge to home after PACU  PATIENT DISPOSITION:  PACU - hemodynamically stable.   Delay start of Pharmacological VTE agent (>24hrs) due to surgical blood loss or risk of bleeding: yes

## 2011-12-06 NOTE — Anesthesia Postprocedure Evaluation (Signed)
Anesthesia Post Note  Patient: Sarah Boyd  Procedure(s) Performed: Procedure(s) (LRB): ARTHROSCOPY SHOULDER (Right)  Anesthesia type: General  Patient location: PACU  Post pain: Pain level controlled  Post assessment: Post-op Vital signs reviewed  Last Vitals:  Filed Vitals:   12/06/11 1119  BP: 143/90  Pulse: 66  Temp: 35.9 C  Resp: 10    Post vital signs: Reviewed  Level of consciousness: sedated  Complications: No apparent anesthesia complications

## 2011-12-06 NOTE — Anesthesia Preprocedure Evaluation (Addendum)
Anesthesia Evaluation  Patient identified by MRN, date of birth, ID band Patient awake    Reviewed: Allergy & Precautions, H&P , NPO status , Patient's Chart, lab work & pertinent test results  Airway Mallampati: II TM Distance: >3 FB Neck ROM: Full    Dental  (+) Edentulous Upper, Partial Lower and Dental Advisory Given   Pulmonary asthma ,  breath sounds clear to auscultation  Pulmonary exam normal       Cardiovascular negative cardio ROS  Rhythm:Regular Rate:Normal     Neuro/Psych negative neurological ROS  negative psych ROS   GI/Hepatic negative GI ROS, Neg liver ROS,   Endo/Other  Hypothyroidism   Renal/GU negative Renal ROS  negative genitourinary   Musculoskeletal negative musculoskeletal ROS (+)   Abdominal   Peds  Hematology negative hematology ROS (+)   Anesthesia Other Findings   Reproductive/Obstetrics negative OB ROS                          Anesthesia Physical Anesthesia Plan  ASA: II  Anesthesia Plan: General   Post-op Pain Management:    Induction: Intravenous  Airway Management Planned: Oral ETT  Additional Equipment:   Intra-op Plan:   Post-operative Plan: Extubation in OR  Informed Consent: I have reviewed the patients History and Physical, chart, labs and discussed the procedure including the risks, benefits and alternatives for the proposed anesthesia with the patient or authorized representative who has indicated his/her understanding and acceptance.   Dental advisory given  Plan Discussed with: CRNA  Anesthesia Plan Comments:         Anesthesia Quick Evaluation

## 2011-12-06 NOTE — Transfer of Care (Signed)
Immediate Anesthesia Transfer of Care Note  Patient: Sarah Boyd  Procedure(s) Performed: Procedure(s) (LRB): ARTHROSCOPY SHOULDER (Right)  Patient Location: PACU  Anesthesia Type: General  Level of Consciousness: awake, alert  and oriented  Airway & Oxygen Therapy: Patient Spontanous Breathing and Patient connected to face mask oxygen  Post-op Assessment: Report given to PACU RN and Post -op Vital signs reviewed and stable  Post vital signs: Reviewed and stable  Complications: No apparent anesthesia complications

## 2011-12-07 ENCOUNTER — Encounter (HOSPITAL_BASED_OUTPATIENT_CLINIC_OR_DEPARTMENT_OTHER): Payer: Self-pay | Admitting: Orthopedic Surgery

## 2011-12-07 LAB — POCT HEMOGLOBIN-HEMACUE: Hemoglobin: 10.4 g/dL — ABNORMAL LOW (ref 12.0–15.0)

## 2011-12-07 NOTE — Progress Notes (Signed)
Message left ok per patient

## 2011-12-08 NOTE — Op Note (Signed)
Sarah Boyd, Sarah Boyd NO.:  [id number]  MEDICAL RECORD NO.:  [id number]  LOCATION:                               FACILITY:  [facility]  PHYSICIAN:  Marlowe Kays, M.D.  DATE OF BIRTH:  1930/03/11  DATE OF PROCEDURE:  12/06/2011 DATE OF DISCHARGE:                              OPERATIVE REPORT   PREOPERATIVE DIAGNOSES: 1. Labral tear. 2. Partial rotator cuff tear.  POSTOPERATIVE DIAGNOSES: 1. Labral tear. 2. Partial rotator cuff tear. 3. Glenohumeral arthritis.  OPERATION: 1. Right shoulder arthroscopy with debridement of labrum and     subscapularis. 2. Arthroscopic subacromial decompression with shaving of bursal     surface of rotator cuff.  SURGEON:  Marlowe Kays, MD  ASSISTANT:  Druscilla Brownie. Idolina Primer, PA-C  ANESTHESIA:  General preceded by interscalene block.  PATHOLOGY AND JUSTIFICATION FOR PROCEDURE:  Mr. Angie Fava assistance was necessary to help hold on rotate the arm and assist with the equipment.  MRI demonstrated the preoperative diagnoses.  PROCEDURE:  Interscalene block by Anesthesia, satisfied general anesthesia in the beach-chair position.  Right shoulder girdle was prepped with DuraPrep, draped in sterile field.  Time-out performed.  I marked out the anatomy of the shoulder and for hemostatic purposes injected the posterior portal, lateral portal, and around the coracoid. Through posterior soft spot portal, I atraumatically entered the glenohumeral joint with findings as noted above, which were good but degenerative tearing of the labrum, wear of the glenoid, and what appeared to be the degenerative arteries tearing of the subscapularis. I advanced the scope between the biceps tendon and subscapularis.  The biceps tendon and some minimal fraying, and using a switching stick, I made an anterior incision over the switching stick, and placed a metal cannula into the joint followed by 4.2 shaver debriding down the subscapularis and  the labrum with final pictures being taken.  I then redirected the scope to the subacromial space and through a lateral portal introduced a 4.2 shaver.  The bursal surface tear of the rotator cuff was identified and shaved down until smooth with the shaver.  She did not have a lot of bursal involvement, I did cleaned this up as necessary.  I then used the 90-degree ArthroCare vaporizer to remove soft tissue from the underneath surface of the acromion.  There was also a band in the anterior portion of the subacromial space, which I resected as well.  The [facility] joint area was identified, there was soft tissue there, but no bony impingement and I cleaned this up with the vaporizer as well.  I then used a 4 mm oval bur to begin burring down the underneath surface of the acromion and alternated back and forth between the bur and the vaporizer until we had a wide decompression.  At the conclusion of the case, there was no unusual bleeding.  All fluid possible was drained from subacromial space.  The 3 portals were closed with 4-0 nylon followed by Betadine, Adaptic, dry sterile dressing, and shoulder immobilizer.  She tolerated the procedure well, was taken to the recovery room in a satisfactory condition with no known complications.          ______________________________ Marlowe Kays,  M.D.     JA/MEDQ  D:  12/06/2011  T:  12/06/2011  Job:  [id number]

## 2011-12-27 ENCOUNTER — Ambulatory Visit: Payer: Medicare Other | Attending: Orthopedic Surgery | Admitting: Physical Therapy

## 2011-12-27 DIAGNOSIS — R5381 Other malaise: Secondary | ICD-10-CM | POA: Insufficient documentation

## 2011-12-27 DIAGNOSIS — IMO0001 Reserved for inherently not codable concepts without codable children: Secondary | ICD-10-CM | POA: Insufficient documentation

## 2011-12-27 DIAGNOSIS — M25519 Pain in unspecified shoulder: Secondary | ICD-10-CM | POA: Insufficient documentation

## 2011-12-27 DIAGNOSIS — M25619 Stiffness of unspecified shoulder, not elsewhere classified: Secondary | ICD-10-CM | POA: Insufficient documentation

## 2012-01-01 ENCOUNTER — Ambulatory Visit: Payer: Medicare Other | Admitting: Physical Therapy

## 2012-01-03 ENCOUNTER — Ambulatory Visit: Payer: Medicare Other | Admitting: Physical Therapy

## 2012-01-08 ENCOUNTER — Ambulatory Visit: Payer: Medicare Other | Admitting: Physical Therapy

## 2012-01-10 ENCOUNTER — Ambulatory Visit: Payer: Medicare Other | Admitting: Physical Therapy

## 2012-01-15 ENCOUNTER — Ambulatory Visit: Payer: Medicare Other | Attending: Orthopedic Surgery | Admitting: Physical Therapy

## 2012-01-15 DIAGNOSIS — M25519 Pain in unspecified shoulder: Secondary | ICD-10-CM | POA: Insufficient documentation

## 2012-01-15 DIAGNOSIS — R5381 Other malaise: Secondary | ICD-10-CM | POA: Insufficient documentation

## 2012-01-15 DIAGNOSIS — IMO0001 Reserved for inherently not codable concepts without codable children: Secondary | ICD-10-CM | POA: Insufficient documentation

## 2012-01-15 DIAGNOSIS — M25619 Stiffness of unspecified shoulder, not elsewhere classified: Secondary | ICD-10-CM | POA: Insufficient documentation

## 2012-01-17 ENCOUNTER — Ambulatory Visit: Payer: Medicare Other | Admitting: Physical Therapy

## 2012-01-22 ENCOUNTER — Ambulatory Visit: Payer: Medicare Other | Admitting: Physical Therapy

## 2012-01-24 ENCOUNTER — Ambulatory Visit: Payer: Medicare Other | Admitting: Physical Therapy

## 2012-01-30 ENCOUNTER — Ambulatory Visit: Payer: Medicare Other | Admitting: Physical Therapy

## 2012-02-01 ENCOUNTER — Ambulatory Visit: Payer: Medicare Other | Admitting: Physical Therapy

## 2012-02-06 ENCOUNTER — Ambulatory Visit: Payer: Medicare Other | Admitting: *Deleted

## 2012-02-08 ENCOUNTER — Ambulatory Visit: Payer: Medicare Other | Admitting: *Deleted

## 2012-02-13 ENCOUNTER — Ambulatory Visit: Payer: Medicare Other | Attending: Orthopedic Surgery | Admitting: Physical Therapy

## 2012-02-13 DIAGNOSIS — IMO0001 Reserved for inherently not codable concepts without codable children: Secondary | ICD-10-CM | POA: Insufficient documentation

## 2012-02-13 DIAGNOSIS — M25619 Stiffness of unspecified shoulder, not elsewhere classified: Secondary | ICD-10-CM | POA: Insufficient documentation

## 2012-02-13 DIAGNOSIS — R5381 Other malaise: Secondary | ICD-10-CM | POA: Insufficient documentation

## 2012-02-13 DIAGNOSIS — M25519 Pain in unspecified shoulder: Secondary | ICD-10-CM | POA: Insufficient documentation

## 2012-02-15 ENCOUNTER — Ambulatory Visit: Payer: Medicare Other | Admitting: Physical Therapy

## 2012-02-19 ENCOUNTER — Ambulatory Visit: Payer: Medicare Other | Admitting: Physical Therapy

## 2012-02-21 ENCOUNTER — Ambulatory Visit: Payer: Medicare Other | Admitting: *Deleted

## 2012-02-27 ENCOUNTER — Ambulatory Visit: Payer: Medicare Other | Admitting: Physical Therapy

## 2012-02-29 ENCOUNTER — Ambulatory Visit: Payer: Medicare Other | Admitting: Physical Therapy

## 2012-03-04 ENCOUNTER — Ambulatory Visit: Payer: Medicare Other | Admitting: *Deleted

## 2012-03-06 ENCOUNTER — Ambulatory Visit: Payer: Medicare Other | Admitting: Physical Therapy

## 2012-03-12 ENCOUNTER — Ambulatory Visit: Payer: Medicare Other | Attending: Orthopedic Surgery | Admitting: Physical Therapy

## 2012-03-12 DIAGNOSIS — R5381 Other malaise: Secondary | ICD-10-CM | POA: Insufficient documentation

## 2012-03-12 DIAGNOSIS — M25519 Pain in unspecified shoulder: Secondary | ICD-10-CM | POA: Insufficient documentation

## 2012-03-12 DIAGNOSIS — M25619 Stiffness of unspecified shoulder, not elsewhere classified: Secondary | ICD-10-CM | POA: Insufficient documentation

## 2012-03-12 DIAGNOSIS — IMO0001 Reserved for inherently not codable concepts without codable children: Secondary | ICD-10-CM | POA: Insufficient documentation

## 2012-03-14 ENCOUNTER — Ambulatory Visit: Payer: Medicare Other | Admitting: Physical Therapy

## 2012-03-18 ENCOUNTER — Ambulatory Visit: Payer: Medicare Other | Admitting: *Deleted

## 2012-03-20 ENCOUNTER — Ambulatory Visit: Payer: Medicare Other | Admitting: Physical Therapy

## 2012-04-23 ENCOUNTER — Other Ambulatory Visit: Payer: Self-pay | Admitting: Orthopedic Surgery

## 2012-04-23 DIAGNOSIS — M25511 Pain in right shoulder: Secondary | ICD-10-CM

## 2012-04-25 ENCOUNTER — Other Ambulatory Visit: Payer: Medicare Other

## 2012-04-29 ENCOUNTER — Other Ambulatory Visit: Payer: Medicare Other

## 2012-05-02 ENCOUNTER — Ambulatory Visit
Admission: RE | Admit: 2012-05-02 | Discharge: 2012-05-02 | Disposition: A | Discharge status: Home / Self Care | Payer: Medicare Other | Source: Ambulatory Visit | Attending: Orthopedic Surgery | Admitting: Orthopedic Surgery

## 2012-05-02 DIAGNOSIS — M25511 Pain in right shoulder: Secondary | ICD-10-CM

## 2012-05-02 MED ORDER — IOHEXOL 180 MG/ML  SOLN
20.0000 mL | Freq: Once | INTRAMUSCULAR | Status: AC | PRN
  Administered 2012-05-02: 20 mL via INTRA_ARTICULAR

## 2012-05-21 ENCOUNTER — Ambulatory Visit: Payer: Medicare Other | Attending: Orthopedic Surgery | Admitting: Physical Therapy

## 2012-05-21 DIAGNOSIS — R5381 Other malaise: Secondary | ICD-10-CM | POA: Insufficient documentation

## 2012-05-21 DIAGNOSIS — IMO0001 Reserved for inherently not codable concepts without codable children: Secondary | ICD-10-CM | POA: Insufficient documentation

## 2012-05-21 DIAGNOSIS — M25519 Pain in unspecified shoulder: Secondary | ICD-10-CM | POA: Insufficient documentation

## 2012-05-21 DIAGNOSIS — M25619 Stiffness of unspecified shoulder, not elsewhere classified: Secondary | ICD-10-CM | POA: Insufficient documentation

## 2012-05-23 ENCOUNTER — Ambulatory Visit: Payer: Medicare Other | Admitting: Physical Therapy

## 2012-05-27 ENCOUNTER — Ambulatory Visit: Payer: Medicare Other | Admitting: *Deleted

## 2012-05-29 ENCOUNTER — Ambulatory Visit: Payer: Medicare Other | Admitting: *Deleted

## 2012-06-03 ENCOUNTER — Ambulatory Visit: Payer: Medicare Other | Admitting: *Deleted

## 2012-06-06 ENCOUNTER — Ambulatory Visit: Payer: Medicare Other | Admitting: Physical Therapy

## 2012-06-10 ENCOUNTER — Ambulatory Visit: Payer: Medicare Other | Admitting: Physical Therapy

## 2012-06-13 ENCOUNTER — Ambulatory Visit: Payer: Medicare Other | Attending: Orthopedic Surgery | Admitting: Physical Therapy

## 2012-06-13 DIAGNOSIS — M25619 Stiffness of unspecified shoulder, not elsewhere classified: Secondary | ICD-10-CM | POA: Insufficient documentation

## 2012-06-13 DIAGNOSIS — IMO0001 Reserved for inherently not codable concepts without codable children: Secondary | ICD-10-CM | POA: Insufficient documentation

## 2012-06-13 DIAGNOSIS — M25519 Pain in unspecified shoulder: Secondary | ICD-10-CM | POA: Insufficient documentation

## 2012-06-13 DIAGNOSIS — R5381 Other malaise: Secondary | ICD-10-CM | POA: Insufficient documentation

## 2012-06-18 ENCOUNTER — Encounter: Payer: Medicare Other | Admitting: Physical Therapy

## 2012-06-20 ENCOUNTER — Ambulatory Visit: Payer: Medicare Other | Admitting: Physical Therapy

## 2012-06-24 ENCOUNTER — Ambulatory Visit: Payer: Medicare Other | Admitting: *Deleted

## 2012-06-26 ENCOUNTER — Ambulatory Visit: Payer: Medicare Other | Admitting: *Deleted

## 2012-08-07 ENCOUNTER — Other Ambulatory Visit: Payer: Self-pay | Admitting: Neurology

## 2012-08-15 ENCOUNTER — Ambulatory Visit
Admission: RE | Admit: 2012-08-15 | Discharge: 2012-08-15 | Disposition: A | Discharge status: Home / Self Care | Payer: Medicare Other | Source: Ambulatory Visit | Attending: Neurology | Admitting: Neurology

## 2012-08-15 MED ORDER — GADOBENATE DIMEGLUMINE 529 MG/ML IV SOLN
8.0000 mL | Freq: Once | INTRAVENOUS | Status: AC | PRN
  Administered 2012-08-15: 8 mL via INTRAVENOUS

## 2012-08-22 ENCOUNTER — Ambulatory Visit: Payer: Medicare Other | Attending: Neurology | Admitting: Physical Therapy

## 2012-08-22 DIAGNOSIS — M25519 Pain in unspecified shoulder: Secondary | ICD-10-CM | POA: Insufficient documentation

## 2012-08-22 DIAGNOSIS — IMO0001 Reserved for inherently not codable concepts without codable children: Secondary | ICD-10-CM | POA: Insufficient documentation

## 2012-08-22 DIAGNOSIS — R5381 Other malaise: Secondary | ICD-10-CM | POA: Insufficient documentation

## 2012-08-22 DIAGNOSIS — M25619 Stiffness of unspecified shoulder, not elsewhere classified: Secondary | ICD-10-CM | POA: Insufficient documentation

## 2012-08-23 ENCOUNTER — Ambulatory Visit: Payer: Medicare Other | Admitting: Physical Therapy

## 2012-08-26 ENCOUNTER — Other Ambulatory Visit: Payer: Self-pay | Admitting: *Deleted

## 2012-08-29 ENCOUNTER — Ambulatory Visit: Payer: Medicare Other | Admitting: Physical Therapy

## 2012-09-02 ENCOUNTER — Ambulatory Visit: Payer: Medicare Other | Admitting: Physical Therapy

## 2012-09-11 ENCOUNTER — Ambulatory Visit: Payer: Medicare Other | Attending: Neurology | Admitting: Physical Therapy

## 2012-09-11 DIAGNOSIS — M25619 Stiffness of unspecified shoulder, not elsewhere classified: Secondary | ICD-10-CM | POA: Insufficient documentation

## 2012-09-11 DIAGNOSIS — R5381 Other malaise: Secondary | ICD-10-CM | POA: Insufficient documentation

## 2012-09-11 DIAGNOSIS — M25519 Pain in unspecified shoulder: Secondary | ICD-10-CM | POA: Insufficient documentation

## 2012-09-11 DIAGNOSIS — IMO0001 Reserved for inherently not codable concepts without codable children: Secondary | ICD-10-CM | POA: Insufficient documentation

## 2012-09-17 ENCOUNTER — Ambulatory Visit: Payer: Medicare Other | Admitting: Physical Therapy

## 2012-09-24 ENCOUNTER — Ambulatory Visit: Payer: Medicare Other | Attending: Neurology | Admitting: Physical Therapy

## 2012-09-24 DIAGNOSIS — IMO0001 Reserved for inherently not codable concepts without codable children: Secondary | ICD-10-CM | POA: Insufficient documentation

## 2012-09-24 DIAGNOSIS — R42 Dizziness and giddiness: Secondary | ICD-10-CM | POA: Insufficient documentation

## 2012-09-24 DIAGNOSIS — R269 Unspecified abnormalities of gait and mobility: Secondary | ICD-10-CM | POA: Insufficient documentation

## 2012-09-26 ENCOUNTER — Ambulatory Visit: Payer: Medicare Other | Admitting: Physical Therapy

## 2012-10-01 ENCOUNTER — Ambulatory Visit: Payer: Medicare Other | Admitting: Physical Therapy

## 2012-10-03 ENCOUNTER — Ambulatory Visit: Payer: Medicare Other | Admitting: Physical Therapy

## 2012-10-08 ENCOUNTER — Ambulatory Visit: Payer: Medicare Other | Admitting: Physical Therapy

## 2012-10-10 ENCOUNTER — Ambulatory Visit: Payer: Medicare Other | Attending: Neurology | Admitting: Physical Therapy

## 2012-10-10 DIAGNOSIS — R269 Unspecified abnormalities of gait and mobility: Secondary | ICD-10-CM | POA: Insufficient documentation

## 2012-10-10 DIAGNOSIS — R42 Dizziness and giddiness: Secondary | ICD-10-CM | POA: Insufficient documentation

## 2012-10-10 DIAGNOSIS — IMO0001 Reserved for inherently not codable concepts without codable children: Secondary | ICD-10-CM | POA: Insufficient documentation

## 2012-10-21 ENCOUNTER — Other Ambulatory Visit: Payer: Self-pay

## 2012-10-21 DIAGNOSIS — Z1231 Encounter for screening mammogram for malignant neoplasm of breast: Secondary | ICD-10-CM

## 2012-10-23 ENCOUNTER — Ambulatory Visit (INDEPENDENT_AMBULATORY_CARE_PROVIDER_SITE_OTHER): Payer: Medicare Other

## 2012-10-23 ENCOUNTER — Ambulatory Visit: Payer: Self-pay

## 2012-10-23 DIAGNOSIS — M810 Age-related osteoporosis without current pathological fracture: Secondary | ICD-10-CM

## 2012-10-29 ENCOUNTER — Telehealth: Payer: Self-pay | Admitting: Pharmacist

## 2012-10-29 NOTE — Telephone Encounter (Signed)
Patient really wants to speak with tammy. I advised that she was seeing patients and she would return her call as soon as she gets a moment.

## 2012-10-30 NOTE — Telephone Encounter (Signed)
Patient had DEXA 10/23/2012 while I was out of office.  Called to discuss results.

## 2012-11-01 NOTE — Telephone Encounter (Signed)
Patient returned her call.

## 2012-11-05 ENCOUNTER — Ambulatory Visit (INDEPENDENT_AMBULATORY_CARE_PROVIDER_SITE_OTHER): Payer: Medicare Other | Admitting: Neurology

## 2012-11-05 ENCOUNTER — Encounter: Payer: Self-pay | Admitting: Neurology

## 2012-11-05 VITALS — BP 129/69 | HR 72 | Temp 97.8°F | Ht 62.0 in | Wt 98.0 lb

## 2012-11-05 DIAGNOSIS — R296 Repeated falls: Secondary | ICD-10-CM

## 2012-11-05 DIAGNOSIS — Z9181 History of falling: Secondary | ICD-10-CM

## 2012-11-05 DIAGNOSIS — R42 Dizziness and giddiness: Secondary | ICD-10-CM

## 2012-11-05 NOTE — Progress Notes (Signed)
Subjective:    Patient ID: Sarah Boyd is a 79 y.o. female.  HPI  Interim history:   Sarah Boyd is an 79 year old right-handed woman who presents for followup consultation of her gait disturbance, balance problems and falls. She is accompanied by her niece today. I first met her on 08/05/2012, at which time she gave a several year history of gait and balance problems. She fell in 2012 and fractured both wrists. Years ago she fell off the porch and hurt her right shoulder. She has a history of breast cancer on the right, status post right mastectomy and chemotherapy. She was also complaining of lightheadedness upon standing quickly. She has an underlying medical history of hypothyroidism, osteoporosis, chronic lung disease, past history of breast cancer, status post right mastectomy and chemotherapy. At the time of her first visit with me she displayed mild gait insecurity but no frank ataxia. I felt she had multifactorial gait disturbance do to arthritis, cerebrovascular atherosclerosis, orthostatic hypotension, advanced age, and possible intermittent vertigo. I suggested a brain MRI with and without contrast and recommended physical therapy for gait and balance training. She was advised to use meclizine as needed. She has not been using it and was not sure if it was helpful.  Her brain MRI from 08/15/12 showed mild changes of age-appropriate generalized cerebral atrophy and chronic microvascular ischemia.  She lives in a 2 storey house by herself. She has fallen two times recently without head injury or LOC. If she gets up too quickly, she feels off balance and lightheaded. She was in PT in [place], [place], but I do not have a report available. [name] will ask them to fax it to me.   Her Past Medical History Is Significant For: Past Medical History  Diagnosis Date  . Labral tear of shoulder RIGHT SHOULDER  . Asthma   . Hypothyroidism   . History of breast cancer S/P RIGHT MASTECTOMY AND CHEMO 21  YRS AGO (APPROX.  1990)    NO RECURRENCE  . Right shoulder pain   . HOH (hard of hearing) NO AIDS    Her Past Surgical History Is Significant For: Past Surgical History  Procedure Laterality Date  . Excision of right chest mass  12-19-1999    BENIGN  . Mastectomy  1990  (APPROX.)    RIGHT BREAST CANCER -- NO RECURRENCE  . Orif bilateral wrist  2009  . Cataract extraction, bilateral    . Shoulder arthroscopy  12/06/2011    Procedure: ARTHROSCOPY SHOULDER;  Surgeon: Drucilla Schmidt, MD;  Location: [facility];  Service: Orthopedics;  Laterality: Right;  WITH LABRIAL DEBRIDEMENT AND SAD INTERSCALINE BLOCK    Her Family History Is Significant For: Family History  Problem Relation Age of Onset  . Stroke Mother     Her Social History Is Significant For: History   Social History  . Marital Status: Single    Spouse Name: N/A    Number of Children: N/A  . Years of Education: N/A   Social History Main Topics  . Smoking status: Never Smoker   . Smokeless tobacco: Never Used  . Alcohol Use: No  . Drug Use: No  . Sexually Active:    Other Topics Concern  . None   Social History Narrative  . None    Her Allergies Are:  Allergies  Allergen Reactions  . Benadryl (Diphenhydramine Hcl) Other (See Comments)    ALTERED MENTAL STATIS  . Chocolate   :   Her Current  Medications Are:  Outpatient Encounter Prescriptions as of 11/05/2012  Medication Sig Dispense Refill  . ALBUTEROL IN Inhale into the lungs as needed.      Marland Kitchen alendronate (FOSAMAX) 70 MG tablet Take 70 mg by mouth every 7 (seven) days. Take with a full glass of water on an empty stomach.      . Fluticasone-Salmeterol (ADVAIR DISKUS IN) Inhale 1 puff into the lungs 2 (two) times daily.      Marland Kitchen levothyroxine (SYNTHROID, LEVOTHROID) 25 MCG tablet Take 25 mcg by mouth every morning.      . montelukast (SINGULAIR) 10 MG tablet Take 10 mg by mouth at bedtime.       No facility-administered encounter  medications on file as of 11/05/2012.   Review of Systems  HENT:       Hearing loss  Respiratory: Positive for wheezing.   Musculoskeletal:       Joint pain ,Aching muscles  Skin:        Birth mark, Itching  Allergic/Immunologic: Positive for environmental allergies.  Neurological: Positive for dizziness.       Confusion  Hematological: Bruises/bleeds easily.  Psychiatric/Behavioral:       Decreased energy    Objective:  Neurologic Exam  Physical Exam Physical Examination:   Filed Vitals:   11/05/12 1215  BP: 129/69  Pulse: 72  Temp: 97.8 F (36.6 C)    General Examination: The patient is a very pleasant 79 y.o. female in no acute distress. She appears frail.   HEENT: Normocephalic, atraumatic, pupils are equal, round and reactive to light and accommodation. Hearing is mildly impaired bilaterally. Neck is supple with full range of motion. She has no symptoms of vertigo upon sudden changes in head position. Extraocular tracking is good without nystagmus noted. Speech is clear. Oropharynx examination reveals mild mouth dryness otherwise no significant findings. Tongue protrudes centrally and palate elevates symmetrically. Neck auscultation reveals no carotid bruits. Chest is clear to auscultation without wheezing or rhonchi noted. Heart sounds are normal without murmurs, rubs or gallops noted. Abdomen is soft, nontender with normal bowel sounds noted. She has no pitting edema in the distal lower extremities. Skin is warm and dry. I do not appreciate any trophic changes and no joint deformities are noted and no joint swelling. She has mild arthritic changes in her hands. Neurologically: Mental status: The patient is awake, alert and oriented in all 4 spheres. Her memory, attention, language and knowledge are age appropriate. Cranial nerves are as described above. Motor exam reveals normal bulk and strength for age. She perhaps has a slight degree of hip flexor weakness bilaterally.  Reflexes are 1+ throughout including her ankles. Cerebellar testing shows no dysmetria or intention tremor on finger to nose testing and no truncal ataxia. Sensory exam is intact to light touch. Upon standing she has no significant lightheadedness. Romberg testing shows minimal swaying but no corrective steps. She walks slightly insecurely and turns with 2 steps. She is not able to tandem walk.            Assessment and Plan:   In summary, Sarah Boyd is a 79 year old lady with a History of gait and balance problems going on for a few years. Her examination shows mild insecurity with her gait but no classic ataxia. Her gait disturbance is likely multifactorial in nature, d/t a combination of intermittent vertigo, arthritis, CNS atherosclerosis, orthostatic hypotension. History physical therapy reports. She is advised to use a walker at all times and she and her  niece were advised to look into a life alert button. Alternatively she can look into an independent living facilities such as a retirement community. She is advised to keep well hydrated and change positions slowly and in the mornings when she gets out of bed she is advised to sit up at the side of the bed for 30 seconds and then stand. She and her niece demonstrated understanding and voiced agreement. I will see her back in 6 months from now, sooner if the need arises. Since she did not have any consistent results with meclizine and did not renew prescription for that today. They are advised to call with any interim questions, concerns, problems or updates.

## 2012-11-05 NOTE — Patient Instructions (Addendum)
I think overall you are doing fairly well but I do want to suggest a few things today:  Remember to drink plenty of fluid, eat healthy meals and do not skip any meals. Try to eat protein with a every meal and eat a healthy snack such as fruit or nuts in between meals. Try to keep a regular sleep-wake schedule and try to exercise daily, particularly in the form of walking, 20-30 minutes a day, if you can.   Change positions slowly.   Engage in social activities in your community and with your family and try to keep up with current events by reading the newspaper or watching the news.   As far as your medications are concerned, I would like to suggest no changes.   As far as diagnostic testing: no new test.   Use a walker at all times. I will look for your Physical therapy report.   I would like to see you back in 6 months, sooner if we need to. Please call us with any interim questions, concerns, problems, updates or refill requests.  Brett Canales is my clinical assistant and will answer any of your questions and relay your messages to me and also relay most of my messages to you.  Our phone number is [phone]. We also have an after hours call service for urgent matters and there is a physician on-call for urgent questions. For any emergencies you know to call 911 or go to the nearest emergency room.   Look into a life alert button.

## 2012-11-07 ENCOUNTER — Other Ambulatory Visit: Payer: Self-pay | Admitting: Nurse Practitioner

## 2012-11-11 NOTE — Telephone Encounter (Signed)
Review Dexa results with patient. She is encouraged to continue to take calcium + vitamin D and eat calcium rich foods.  She is also instructed to continue alendronate 70mg  1 tablet weekly and review administration - take on an empty stomach with a full glass of water.  No eating or drinking other than water, no lying down or reclining for 30 minutes after taking medication.  Recheck DEXA in 2 years.

## 2012-12-01 ENCOUNTER — Encounter (HOSPITAL_COMMUNITY): Payer: Self-pay | Admitting: *Deleted

## 2012-12-01 ENCOUNTER — Emergency Department (HOSPITAL_COMMUNITY)
Admission: EM | Admit: 2012-12-01 | Discharge: 2012-12-01 | Disposition: A | Discharge status: Home / Self Care | Payer: Medicare Other | Attending: Emergency Medicine | Admitting: Emergency Medicine

## 2012-12-01 ENCOUNTER — Emergency Department (HOSPITAL_COMMUNITY): Payer: Medicare Other

## 2012-12-01 DIAGNOSIS — Y92009 Unspecified place in unspecified non-institutional (private) residence as the place of occurrence of the external cause: Secondary | ICD-10-CM | POA: Insufficient documentation

## 2012-12-01 DIAGNOSIS — Z853 Personal history of malignant neoplasm of breast: Secondary | ICD-10-CM | POA: Insufficient documentation

## 2012-12-01 DIAGNOSIS — J45909 Unspecified asthma, uncomplicated: Secondary | ICD-10-CM | POA: Insufficient documentation

## 2012-12-01 DIAGNOSIS — S2231XA Fracture of one rib, right side, initial encounter for closed fracture: Secondary | ICD-10-CM

## 2012-12-01 DIAGNOSIS — Y9389 Activity, other specified: Secondary | ICD-10-CM | POA: Insufficient documentation

## 2012-12-01 DIAGNOSIS — Z79899 Other long term (current) drug therapy: Secondary | ICD-10-CM | POA: Insufficient documentation

## 2012-12-01 DIAGNOSIS — IMO0002 Reserved for concepts with insufficient information to code with codable children: Secondary | ICD-10-CM | POA: Insufficient documentation

## 2012-12-01 DIAGNOSIS — S2239XA Fracture of one rib, unspecified side, initial encounter for closed fracture: Secondary | ICD-10-CM | POA: Insufficient documentation

## 2012-12-01 DIAGNOSIS — E039 Hypothyroidism, unspecified: Secondary | ICD-10-CM | POA: Insufficient documentation

## 2012-12-01 DIAGNOSIS — R296 Repeated falls: Secondary | ICD-10-CM | POA: Insufficient documentation

## 2012-12-01 MED ORDER — OXYCODONE-ACETAMINOPHEN 5-325 MG PO TABS
1.0000 | ORAL_TABLET | Freq: Once | ORAL | Status: AC
  Administered 2012-12-01: 1 via ORAL
  Filled 2012-12-01: qty 1

## 2012-12-01 MED ORDER — OXYCODONE-ACETAMINOPHEN 5-325 MG PO TABS: 1.0000 | ORAL_TABLET | ORAL | Status: DC | PRN

## 2012-12-01 NOTE — ED Notes (Signed)
Pt states that she lost her balance this am and fell striking right rib cage area against a dresser, unsure of what caused pt to fall, pt denies any LOC, dizziness, tripping, blacking out. States "I fall all the time" pain is worse with palpation, movement and breathing. Denies any SOB

## 2012-12-01 NOTE — ED Provider Notes (Signed)
History  This chart was scribed for Sarah Hutching, MD by Manuela Schwartz, ED scribe. This patient was seen in room APA19/APA19 and the patient's care was started at 1553.   CSN: [id number]  Arrival date & time 12/01/12  1553   First MD Initiated Contact with Patient 12/01/12 1612      Chief Complaint  Patient presents with  . Rib Injury   Patient is a 79 y.o. female presenting with fall. The history is provided by the patient. No language interpreter was used.  Fall This is a new problem. The current episode started 3 to 5 hours ago. The problem has not changed since onset.Pertinent negatives include no shortness of breath. Associated symptoms comments: Right rib pain. The symptoms are aggravated by bending. The symptoms are relieved by rest. She has tried nothing for the symptoms.   HPI Comments: Sarah Boyd is a 79 y.o. female who presents to the Emergency Department complaining of  acute onset right rib pain after she fell at home this AM against her dresser. She states unsure what caused her to fall but she denies LOC, dizziness, SOB but she does have occasional falls. She denies any other injuries.     Past Medical History  Diagnosis Date  . Labral tear of shoulder RIGHT SHOULDER  . Asthma   . Hypothyroidism   . History of breast cancer S/P RIGHT MASTECTOMY AND CHEMO 21 YRS AGO (APPROX.  1990)    NO RECURRENCE  . Right shoulder pain   . HOH (hard of hearing) NO AIDS    Past Surgical History  Procedure Laterality Date  . Excision of right chest mass  12-19-1999    BENIGN  . Mastectomy  1990  (APPROX.)    RIGHT BREAST CANCER -- NO RECURRENCE  . Orif bilateral wrist  2009  . Cataract extraction, bilateral    . Shoulder arthroscopy  12/06/2011    Procedure: ARTHROSCOPY SHOULDER;  Surgeon: Drucilla Schmidt, MD;  Location: [facility];  Service: Orthopedics;  Laterality: Right;  WITH LABRIAL DEBRIDEMENT AND SAD INTERSCALINE BLOCK    Family History  Problem  Relation Age of Onset  . Stroke Mother     History  Substance Use Topics  . Smoking status: Never Smoker   . Smokeless tobacco: Never Used  . Alcohol Use: No    OB History   Grav Para Term Preterm Abortions TAB SAB Ect Mult Living                  Review of Systems  Constitutional: Negative for fever and chills.  Respiratory: Negative for shortness of breath.   Gastrointestinal: Negative for nausea and vomiting.  Musculoskeletal:       Tenderness at right posterior chest wall  Neurological: Negative for weakness.  All other systems reviewed and are negative.   A complete 10 system review of systems was obtained and all systems are negative except as noted in the HPI and PMH.   Allergies  Benadryl and Chocolate  Home Medications   Current Outpatient Rx  Name  Route  Sig  Dispense  Refill  . albuterol (PROAIR HFA) 108 (90 BASE) MCG/ACT inhaler   Inhalation   Inhale 2 puffs into the lungs every 6 (six) hours as needed for wheezing or shortness of breath.         Marland Kitchen alendronate (FOSAMAX) 70 MG tablet   Oral   Take 70 mg by mouth every 7 (seven) days. Take with a  full glass of water on an empty stomach.(on Sunday)         . Fluticasone-Salmeterol (ADVAIR DISKUS IN)   Inhalation   Inhale 1 puff into the lungs 2 (two) times daily.         Marland Kitchen levothyroxine (SYNTHROID, LEVOTHROID) 25 MCG tablet   Oral   Take 25 mcg by mouth every morning.         . montelukast (SINGULAIR) 10 MG tablet   Oral   Take 10 mg by mouth at bedtime.           Triage vitals: BP 139/65  Pulse 77  Temp(Src) 98.2 F (36.8 C) (Oral)  Resp 19  Ht 5\' 1"  (1.549 m)  Wt 98 lb (44.453 kg)  BMI 18.53 kg/m2  SpO2 98%  Physical Exam  Nursing note and vitals reviewed. Constitutional: She is oriented to person, place, and time. She appears well-developed and well-nourished.  HENT:  Head: Normocephalic and atraumatic.  Eyes: Conjunctivae and EOM are normal. Pupils are equal, round, and  reactive to light.  Neck: Normal range of motion. Neck supple.  Cardiovascular: Normal rate, regular rhythm and normal heart sounds.   Pulmonary/Chest: Effort normal and breath sounds normal. She exhibits tenderness (tender over right inferior posterior chest wall).  Abdominal: Soft. Bowel sounds are normal.  Musculoskeletal: Normal range of motion.  Neurological: She is alert and oriented to person, place, and time.  Skin: Skin is warm and dry.  Psychiatric: She has a normal mood and affect.    ED Course  Procedures (including critical care time) DIAGNOSTIC STUDIES: Oxygen Saturation is 98% on room air, normal by my interpretation.    COORDINATION OF CARE: At 445 PM Discussed treatment plan with patient which includes pain medicine. Patient agrees.   Labs Reviewed - No data to display Dg Ribs Unilateral W/chest Right  12/01/2012   *RADIOLOGY REPORT*  Clinical Data: rib injury  RIGHT RIBS AND CHEST - 3+ VIEW  Comparison: None.  Findings: Normal heart size.  No pleural effusion or edema identified. The no airspace consolidation identified.  Review of the visualized osseous structures is unremarkable. Right anterior 10th rib fracture deformity is identified.  IMPRESSION:  1. Right tenth rib fracture.   Original Report Authenticated By: Signa Kell, M.D.   Results for orders placed during the hospital encounter of 12/06/11  POCT HEMOGLOBIN-HEMACUE      Result Value Range   Hemoglobin 10.4 (*) 12.0 - 15.0 g/dL   No results found.    No diagnosis found.    MDM   X-ray shows a right 10th rib fracture. Patient is hemodynamically stable. Rx Percocet    I personally performed the services described in this documentation, which was scribed in my presence. The recorded information has been reviewed and is accurate.          Sarah Hutching, MD 12/01/12 [id number]

## 2012-12-03 ENCOUNTER — Encounter: Payer: Self-pay | Admitting: Neurology

## 2012-12-03 ENCOUNTER — Ambulatory Visit
Admission: RE | Admit: 2012-12-03 | Discharge: 2012-12-03 | Disposition: A | Discharge status: Home / Self Care | Payer: Medicare Other | Source: Ambulatory Visit

## 2012-12-03 ENCOUNTER — Telehealth: Payer: Self-pay | Admitting: *Deleted

## 2012-12-03 DIAGNOSIS — R269 Unspecified abnormalities of gait and mobility: Secondary | ICD-10-CM

## 2012-12-03 DIAGNOSIS — Z1231 Encounter for screening mammogram for malignant neoplasm of breast: Secondary | ICD-10-CM

## 2012-12-03 DIAGNOSIS — R296 Repeated falls: Secondary | ICD-10-CM

## 2012-12-03 NOTE — Telephone Encounter (Signed)
Patient's neice called stating the patient has fallen Sunday and fractured her ribs and the neice is wanting a order for a rolling walker with seat. Patient's niece needs a letter stating that her aunt doesn't need to be living alone, so she can take to [employer] office

## 2012-12-03 NOTE — Telephone Encounter (Signed)
Please fax order for walker to patient's DME company of choice.

## 2012-12-03 NOTE — Telephone Encounter (Signed)
Please have niece pick up a letter.

## 2012-12-04 NOTE — Telephone Encounter (Signed)
I called and left a message for Ms. Scales that the patient's letter was ready for pick-up and the order for a rolling walker with seat will be faxed to [facility].

## 2012-12-05 ENCOUNTER — Ambulatory Visit (INDEPENDENT_AMBULATORY_CARE_PROVIDER_SITE_OTHER): Payer: Medicare Other | Admitting: Nurse Practitioner

## 2012-12-05 ENCOUNTER — Encounter: Payer: Self-pay | Admitting: Nurse Practitioner

## 2012-12-05 VITALS — BP 126/63 | HR 91 | Temp 97.6°F | Ht 61.0 in

## 2012-12-05 DIAGNOSIS — E039 Hypothyroidism, unspecified: Secondary | ICD-10-CM

## 2012-12-05 DIAGNOSIS — J4520 Mild intermittent asthma, uncomplicated: Secondary | ICD-10-CM

## 2012-12-05 DIAGNOSIS — K5900 Constipation, unspecified: Secondary | ICD-10-CM

## 2012-12-05 DIAGNOSIS — R296 Repeated falls: Secondary | ICD-10-CM

## 2012-12-05 DIAGNOSIS — Z9181 History of falling: Secondary | ICD-10-CM

## 2012-12-05 DIAGNOSIS — M810 Age-related osteoporosis without current pathological fracture: Secondary | ICD-10-CM

## 2012-12-05 DIAGNOSIS — J45909 Unspecified asthma, uncomplicated: Secondary | ICD-10-CM

## 2012-12-05 LAB — COMPLETE METABOLIC PANEL WITH GFR
ALT: 14 U/L (ref 0–35)
AST: 24 U/L (ref 0–37)
Alkaline Phosphatase: 54 U/L (ref 39–117)
Calcium: 9.2 mg/dL (ref 8.4–10.5)
Chloride: 104 mEq/L (ref 96–112)
Creat: 0.87 mg/dL (ref 0.50–1.10)

## 2012-12-05 MED ORDER — LEVOTHYROXINE SODIUM 25 MCG PO TABS: 25.0000 ug | ORAL_TABLET | Freq: Every morning | ORAL | Status: DC

## 2012-12-05 NOTE — Progress Notes (Signed)
  Subjective:    Patient ID: Sarah Boyd, female    DOB: 21-Jan-1930, 79 y.o.   MRN: [id number]  HPI Pt here for follow up from a ED visit Sunday. PT fell and broke right rib. Pt got percocet RX, but since taking it she has been experiencing constipation. Has tried stool softeners and fleets enema with no relief. States she has not had bowel movement since Sunday.  Pt states she has fallen at least 6 times this year with several injuries including broken tail bone and right rib. States she just walks and falls. States she feels weak. Denies any dizziness, lightheadedness.     Review of Systems  Gastrointestinal: Positive for constipation.  Neurological: Positive for weakness.       Objective:   Physical Exam  Constitutional: She is oriented to person, place, and time. She appears well-developed and well-nourished.  HENT:  Head: Normocephalic.  Neck: Normal range of motion. Neck supple. No thyromegaly present.  Cardiovascular: Normal rate and normal heart sounds.   Pulmonary/Chest: Effort normal and breath sounds normal.  Diminished breath sounds bilaterally   Abdominal: Bowel sounds are normal. She exhibits distension.  Musculoskeletal: Normal range of motion.  Neurological: She is alert and oriented to person, place, and time.  Skin: Skin is warm and dry.  Psychiatric: She has a normal mood and affect. Her behavior is normal. Judgment and thought content normal.     BP 126/63  Pulse 91  Temp(Src) 97.6 F (36.4 C) (Oral)  Ht 5\' 1"  (1.549 m)      Assessment & Plan:  1. Frequent falls *Fall precautions discussed Rise slowly from sitting or laying position Force flids   2. Constipation Miralax OTC ENema if needed Increase fiber in diet RTO prn  Mary-Margaret Daphine Deutscher, FNP

## 2012-12-05 NOTE — Patient Instructions (Addendum)
Constipation, Adult Constipation is when a person has fewer than 3 bowel movements a week; has difficulty having a bowel movement; or has stools that are dry, hard, or larger than normal. As people grow older, constipation is more common. If you try to fix constipation with medicines that make you have a bowel movement (laxatives), the problem may get worse. Long-term laxative use may cause the muscles of the colon to become weak. A low-fiber diet, not taking in enough fluids, and taking certain medicines may make constipation worse. CAUSES   Certain medicines, such as antidepressants, pain medicine, iron supplements, antacids, and water pills.   Certain diseases, such as diabetes, irritable bowel syndrome (IBS), thyroid disease, or depression.   Not drinking enough water.   Not eating enough fiber-rich foods.   Stress or travel.  Lack of physical activity or exercise.  Not going to the restroom when there is the urge to have a bowel movement.  Ignoring the urge to have a bowel movement.  Using laxatives too much. SYMPTOMS   Having fewer than 3 bowel movements a week.   Straining to have a bowel movement.   Having hard, dry, or larger than normal stools.   Feeling full or bloated.   Pain in the lower abdomen.  Not feeling relief after having a bowel movement. DIAGNOSIS  Your caregiver will take a medical history and perform a physical exam. Further testing may be done for severe constipation. Some tests may include:   A barium enema X-ray to examine your rectum, colon, and sometimes, your small intestine.  A sigmoidoscopy to examine your lower colon.  A colonoscopy to examine your entire colon. TREATMENT  Treatment will depend on the severity of your constipation and what is causing it. Some dietary treatments include drinking more fluids and eating more fiber-rich foods. Lifestyle treatments may include regular exercise. If these diet and lifestyle recommendations  do not help, your caregiver may recommend taking over-the-counter laxative medicines to help you have bowel movements. Prescription medicines may be prescribed if over-the-counter medicines do not work.  HOME CARE INSTRUCTIONS   Increase dietary fiber in your diet, such as fruits, vegetables, whole grains, and beans. Limit high-fat and processed sugars in your diet, such as [place] fries, hamburgers, cookies, candies, and soda.   A fiber supplement may be added to your diet if you cannot get enough fiber from foods.   Drink enough fluids to keep your urine clear or pale yellow.   Exercise regularly or as directed by your caregiver.   Go to the restroom when you have the urge to go. Do not hold it.  Only take medicines as directed by your caregiver. Do not take other medicines for constipation without talking to your caregiver first. SEEK IMMEDIATE MEDICAL CARE IF:   You have bright red blood in your stool.   Your constipation lasts for more than 4 days or gets worse.   You have abdominal or rectal pain.   You have thin, pencil-like stools.  You have unexplained weight loss. MAKE SURE YOU:   Understand these instructions.  Will watch your condition.  Will get help right away if you are not doing well or get worse. Document Released: 02/25/2004 Document Revised: 08/21/2011 Document Reviewed: 05/02/2011 [facility] Patient Information 2014 [place], [place]. Fall Prevention and Home Safety Falls cause injuries and can affect all age groups. It is possible to use preventive measures to significantly decrease the likelihood of falls. There are many simple measures which can make  your home safer and prevent falls. OUTDOORS  Repair cracks and edges of walkways and driveways.  Remove high doorway thresholds.  Trim shrubbery on the main path into your home.  Have good outside lighting.  Clear walkways of tools, rocks, debris, and clutter.  Check that handrails are not broken  and are securely fastened. Both sides of steps should have handrails.  Have leaves, snow, and ice cleared regularly.  Use sand or salt on walkways during winter months.  In the garage, clean up grease or oil spills. BATHROOM  Install night lights.  Install grab bars by the toilet and in the tub and shower.  Use non-skid mats or decals in the tub or shower.  Place a plastic non-slip stool in the shower to sit on, if needed.  Keep floors dry and clean up all water on the floor immediately.  Remove soap buildup in the tub or shower on a regular basis.  Secure bath mats with non-slip, double-sided rug tape.  Remove throw rugs and tripping hazards from the floors. BEDROOMS  Install night lights.  Make sure a bedside light is easy to reach.  Do not use oversized bedding.  Keep a telephone by your bedside.  Have a firm chair with side arms to use for getting dressed.  Remove throw rugs and tripping hazards from the floor. KITCHEN  Keep handles on pots and pans turned toward the center of the stove. Use back burners when possible.  Clean up spills quickly and allow time for drying.  Avoid walking on wet floors.  Avoid hot utensils and knives.  Position shelves so they are not too high or low.  Place commonly used objects within easy reach.  If necessary, use a sturdy step stool with a grab bar when reaching.  Keep electrical cables out of the way.  Do not use floor polish or wax that makes floors slippery. If you must use wax, use non-skid floor wax.  Remove throw rugs and tripping hazards from the floor. STAIRWAYS  Never leave objects on stairs.  Place handrails on both sides of stairways and use them. Fix any loose handrails. Make sure handrails on both sides of the stairways are as long as the stairs.  Check carpeting to make sure it is firmly attached along stairs. Make repairs to worn or loose carpet promptly.  Avoid placing throw rugs at the top or  bottom of stairways, or properly secure the rug with carpet tape to prevent slippage. Get rid of throw rugs, if possible.  Have an electrician put in a light switch at the top and bottom of the stairs. OTHER FALL PREVENTION TIPS  Wear low-heel or rubber-soled shoes that are supportive and fit well. Wear closed toe shoes.  When using a stepladder, make sure it is fully opened and both spreaders are firmly locked. Do not climb a closed stepladder.  Add color or contrast paint or tape to grab bars and handrails in your home. Place contrasting color strips on first and last steps.  Learn and use mobility aids as needed. Install an electrical emergency response system.  Turn on lights to avoid dark areas. Replace light bulbs that burn out immediately. Get light switches that glow.  Arrange furniture to create clear pathways. Keep furniture in the same place.  Firmly attach carpet with non-skid or double-sided tape.  Eliminate uneven floor surfaces.  Select a carpet pattern that does not visually hide the edge of steps.  Be aware of all pets. OTHER  HOME SAFETY TIPS  Set the water temperature for 120 F (48.8 C).  Keep emergency numbers on or near the telephone.  Keep smoke detectors on every level of the home and near sleeping areas. Document Released: 05/19/2002 Document Revised: 11/28/2011 Document Reviewed: 08/18/2011 [facility] Patient Information 2014 [place].

## 2012-12-06 LAB — ANEMIA PANEL 7
%SAT: 10 % — ABNORMAL LOW (ref 20–55)
ABS Retic: 50.3 10*3/uL (ref 19.0–186.0)
HCT: 37.3 % (ref 36.0–46.0)
Hemoglobin: 12.5 g/dL (ref 12.0–15.0)
Iron: 30 ug/dL — ABNORMAL LOW (ref 42–145)
MCH: 27.4 pg (ref 26.0–34.0)
MCV: 81.6 fL (ref 78.0–100.0)
Platelets: 311 10*3/uL (ref 150–400)
RBC: 4.57 MIL/uL (ref 3.87–5.11)
TIBC: 303 ug/dL (ref 250–470)
UIBC: 273 ug/dL (ref 125–400)
Vitamin B-12: 422 pg/mL (ref 211–911)
WBC: 12.3 10*3/uL — ABNORMAL HIGH (ref 4.0–10.5)

## 2012-12-07 ENCOUNTER — Other Ambulatory Visit: Payer: Self-pay | Admitting: Nurse Practitioner

## 2012-12-10 ENCOUNTER — Other Ambulatory Visit: Payer: Self-pay | Admitting: Nurse Practitioner

## 2013-01-09 ENCOUNTER — Other Ambulatory Visit: Payer: Self-pay | Admitting: Nurse Practitioner

## 2013-03-27 ENCOUNTER — Telehealth: Payer: Self-pay | Admitting: Nurse Practitioner

## 2013-03-28 NOTE — Telephone Encounter (Signed)
anytime in the next month or so is fine

## 2013-03-31 NOTE — Telephone Encounter (Signed)
Patient aware.

## 2013-04-28 ENCOUNTER — Encounter (INDEPENDENT_AMBULATORY_CARE_PROVIDER_SITE_OTHER): Payer: Self-pay

## 2013-04-28 ENCOUNTER — Encounter: Payer: Self-pay | Admitting: Nurse Practitioner

## 2013-04-28 ENCOUNTER — Ambulatory Visit (INDEPENDENT_AMBULATORY_CARE_PROVIDER_SITE_OTHER): Payer: Medicare Other | Admitting: Nurse Practitioner

## 2013-04-28 VITALS — BP 135/64 | HR 80 | Temp 96.8°F | Ht 61.0 in | Wt 98.0 lb

## 2013-04-28 DIAGNOSIS — Z23 Encounter for immunization: Secondary | ICD-10-CM

## 2013-04-28 DIAGNOSIS — M810 Age-related osteoporosis without current pathological fracture: Secondary | ICD-10-CM

## 2013-04-28 DIAGNOSIS — E039 Hypothyroidism, unspecified: Secondary | ICD-10-CM

## 2013-04-28 DIAGNOSIS — J45909 Unspecified asthma, uncomplicated: Secondary | ICD-10-CM

## 2013-04-28 MED ORDER — ALBUTEROL SULFATE HFA 108 (90 BASE) MCG/ACT IN AERS: 2.0000 | INHALATION_SPRAY | Freq: Four times a day (QID) | RESPIRATORY_TRACT | Status: DC | PRN

## 2013-04-28 MED ORDER — FLUTICASONE-SALMETEROL 100-50 MCG/DOSE IN AEPB: 1.0000 | INHALATION_SPRAY | Freq: Two times a day (BID) | RESPIRATORY_TRACT | Status: DC

## 2013-04-28 MED ORDER — MONTELUKAST SODIUM 10 MG PO TABS: 10.0000 mg | ORAL_TABLET | Freq: Every day | ORAL | Status: DC

## 2013-04-28 MED ORDER — ALENDRONATE SODIUM 70 MG PO TABS: 70.0000 mg | ORAL_TABLET | ORAL | Status: DC

## 2013-04-28 MED ORDER — LEVOTHYROXINE SODIUM 25 MCG PO TABS: 25.0000 ug | ORAL_TABLET | Freq: Every day | ORAL | Status: DC

## 2013-04-28 NOTE — Progress Notes (Signed)
Subjective:    Patient ID: Sarah Boyd, female    DOB: 11-14-1929, 79 y.o.   MRN: [id number]  HPI  Patient here today for follow up of medical problems- she is doing well- no complaints today. Patient Active Problem List   Diagnosis Date Noted  . Asthma, chronic 12/05/2012  . Osteoporosis 12/05/2012  . Hypothyroidism 12/05/2012   Outpatient Encounter Prescriptions as of 04/28/2013  Medication Sig  . albuterol (PROAIR HFA) 108 (90 BASE) MCG/ACT inhaler Inhale 2 puffs into the lungs every 6 (six) hours as needed for wheezing or shortness of breath.  Marland Kitchen alendronate (FOSAMAX) 70 MG tablet TAKE 1 TABLET EVERY WEEK  . Fluticasone-Salmeterol (ADVAIR DISKUS IN) Inhale 1 puff into the lungs 2 (two) times daily.  Marland Kitchen levothyroxine (SYNTHROID, LEVOTHROID) 25 MCG tablet TAKE 1 TABLET BY MOUTH EVERY DAY IN THE MORNING  . montelukast (SINGULAIR) 10 MG tablet TAKE 1 TABLET BY MOUTH AT BEDTIME  . [DISCONTINUED] oxyCODONE-acetaminophen (PERCOCET) 5-325 MG per tablet Take 1 tablet by mouth every 4 (four) hours as needed for pain.  . [DISCONTINUED] alendronate (FOSAMAX) 70 MG tablet Take 70 mg by mouth every 7 (seven) days. Take with a full glass of water on an empty stomach.(on Sunday)       Review of Systems  Constitutional: Negative.   HENT: Negative.   Respiratory: Negative.   Cardiovascular: Negative.   Gastrointestinal: Negative.   Genitourinary: Negative.   Musculoskeletal: Negative.   Neurological: Negative.   Hematological: Negative.   Psychiatric/Behavioral: Negative.        Objective:   Physical Exam  Constitutional: She is oriented to person, place, and time. She appears well-developed and well-nourished.  HENT:  Nose: Nose normal.  Mouth/Throat: Oropharynx is clear and moist.  Eyes: EOM are normal.  Neck: Trachea normal, normal range of motion and full passive range of motion without pain. Neck supple. No JVD present. Carotid bruit is not present. No thyromegaly present.   Cardiovascular: Normal rate, regular rhythm, normal heart sounds and intact distal pulses.  Exam reveals no gallop and no friction rub.   No murmur heard. Pulmonary/Chest: Effort normal and breath sounds normal.  Abdominal: Soft. Bowel sounds are normal. She exhibits no distension and no mass. There is no tenderness.  Musculoskeletal: Normal range of motion.  Lymphadenopathy:    She has no cervical adenopathy.  Neurological: She is alert and oriented to person, place, and time. She has normal reflexes.  Skin: Skin is warm and dry.  Psychiatric: She has a normal mood and affect. Her behavior is normal. Judgment and thought content normal.   BP 135/64  Pulse 80  Temp(Src) 96.8 F (36 C) (Oral)  Ht 5\' 1"  (1.549 m)  Wt 98 lb (44.453 kg)  BMI 18.53 kg/m2        Assessment & Plan:   1. Osteoporosis   2. Hypothyroidism   3. Asthma, chronic    Orders Placed This Encounter  Procedures  . DG Bone Density    Standing Status: Future     Number of Occurrences:      Standing Expiration Date: 06/28/2014    Order Specific Question:  Reason for Exam (SYMPTOM  OR DIAGNOSIS REQUIRED)    Answer:  osteoporosis    Order Specific Question:  Preferred imaging location?    Answer:  Internal  . CMP14+EGFR  . NMR, lipoprofile  . Thyroid Panel With TSH  . POCT CBC   Meds ordered this encounter  Medications  . levothyroxine (SYNTHROID, LEVOTHROID)  25 MCG tablet    Sig: Take 1 tablet (25 mcg total) by mouth daily before breakfast.    Dispense:  30 tablet    Refill:  8    Order Specific Question:  Supervising Provider    Answer:  Ernestina Penna [[id number]]  . montelukast (SINGULAIR) 10 MG tablet    Sig: Take 1 tablet (10 mg total) by mouth at bedtime.    Dispense:  30 tablet    Refill:  4    Order Specific Question:  Supervising Provider    Answer:  Ernestina Penna [[id number]]  . Fluticasone-Salmeterol (ADVAIR) 100-50 MCG/DOSE AEPB    Sig: Inhale 1 puff into the lungs 2 (two) times daily.     Dispense:  1 each    Refill:  3    Order Specific Question:  Supervising Provider    Answer:  Ernestina Penna [[id number]]  . albuterol (PROAIR HFA) 108 (90 BASE) MCG/ACT inhaler    Sig: Inhale 2 puffs into the lungs every 6 (six) hours as needed for wheezing or shortness of breath.    Dispense:  1 each    Refill:  1    Order Specific Question:  Supervising Provider    Answer:  Ernestina Penna [[id number]]  . alendronate (FOSAMAX) 70 MG tablet    Sig: Take 1 tablet (70 mg total) by mouth once a week. Take with a full glass of water on an empty stomach.    Dispense:  4 tablet    Refill:  5    Order Specific Question:  Supervising Provider    Answer:  Ernestina Penna [[id number]]    Continue all meds Labs pending Diet and exercise encouraged Health maintenance reviewed Follow up in 6 months  Sarah Daphine Deutscher, FNP '

## 2013-04-28 NOTE — Patient Instructions (Signed)

## 2013-05-05 ENCOUNTER — Ambulatory Visit: Payer: Medicare Other | Admitting: Neurology

## 2013-05-05 ENCOUNTER — Ambulatory Visit: Payer: Self-pay | Admitting: Neurology

## 2013-05-12 ENCOUNTER — Ambulatory Visit (INDEPENDENT_AMBULATORY_CARE_PROVIDER_SITE_OTHER): Payer: Medicare Other | Admitting: Neurology

## 2013-05-12 ENCOUNTER — Encounter: Payer: Self-pay | Admitting: Neurology

## 2013-05-12 VITALS — BP 116/59 | HR 80 | Ht 62.0 in | Wt 100.0 lb

## 2013-05-12 DIAGNOSIS — Z9181 History of falling: Secondary | ICD-10-CM

## 2013-05-12 DIAGNOSIS — R269 Unspecified abnormalities of gait and mobility: Secondary | ICD-10-CM

## 2013-05-12 DIAGNOSIS — R42 Dizziness and giddiness: Secondary | ICD-10-CM

## 2013-05-12 DIAGNOSIS — R296 Repeated falls: Secondary | ICD-10-CM

## 2013-05-12 NOTE — Progress Notes (Signed)
Subjective:    Patient ID: Sarah Boyd is a 79 y.o. female.  HPI    Interim history:  Sarah Boyd is an 79 year old right-handed woman who presents for followup consultation of her gait disturbance, balance problems and falls. She is accompanied by her niece, Sedalia Muta, again today. I last saw her on 11/05/2012, in which time I asked her to use her walker at all times. I also advised her to look into a life alert button. I felt, she most likely had a multifactorial gait disorder, d/t a combination of intermittent vertigo, arthritis, CNS atherosclerosis, and orthostatic hypotension. In the interim, she fell on 12/01/2012 and broke a rib. She fell against the dresser. Her niece goes to see her at least once a week and helps her with groceries. The patient loves to walk and has been using her walker consistently. She has walked to [employer] and to [employer]. She has been through PT.  I first met her on 08/05/2012, at which time she gave a several year history of gait and balance problems. She fell in 2012 and fractured both wrists. Years ago she fell off the porch and hurt her right shoulder. She has a history of breast cancer on the right, status post right mastectomy and chemotherapy. She was also complaining of lightheadedness upon standing quickly. She has an underlying medical history of hypothyroidism, osteoporosis, chronic lung disease, past history of breast cancer, status post right mastectomy and chemotherapy. At the time of her first visit with me she displayed mild gait insecurity but no frank ataxia. I felt she had multifactorial gait disturbance due to arthritis, cerebrovascular atherosclerosis, orthostatic hypotension, advanced age, and possible intermittent vertigo. I suggested a brain MRI with and without contrast and recommended physical therapy for gait and balance training. She was advised to use meclizine as needed. She has not been using it and was not sure if it was helpful.  Her brain  MRI from 08/15/12 showed mild changes of age-appropriate generalized cerebral atrophy and chronic microvascular ischemia.  She lives in a 2 storey house by herself. If she gets up too quickly, she feels off balance and lightheaded. She was in PT in [place], [place], but I do not have a report available.  She recently had a check up with her PCP.   Her Past Medical History Is Significant For: Past Medical History  Diagnosis Date  . Labral tear of shoulder RIGHT SHOULDER  . Asthma   . Hypothyroidism   . History of breast cancer S/P RIGHT MASTECTOMY AND CHEMO 21 YRS AGO (APPROX.  1990)    NO RECURRENCE  . Right shoulder pain   . HOH (hard of hearing) NO AIDS    Her Past Surgical History Is Significant For: Past Surgical History  Procedure Laterality Date  . Excision of right chest mass  12-19-1999    BENIGN  . Mastectomy  1990  (APPROX.)    RIGHT BREAST CANCER -- NO RECURRENCE  . Orif bilateral wrist  2009  . Cataract extraction, bilateral    . Shoulder arthroscopy  12/06/2011    Procedure: ARTHROSCOPY SHOULDER;  Surgeon: Drucilla Schmidt, MD;  Location: [facility];  Service: Orthopedics;  Laterality: Right;  WITH LABRIAL DEBRIDEMENT AND SAD INTERSCALINE BLOCK    Her Family History Is Significant For: Family History  Problem Relation Age of Onset  . Stroke Mother     Her Social History Is Significant For: History   Social History  . Marital Status: Single  Spouse Name: N/A    Number of Children: N/A  . Years of Education: N/A   Social History Main Topics  . Smoking status: Never Smoker   . Smokeless tobacco: Never Used  . Alcohol Use: No  . Drug Use: No  . Sexual Activity:    Other Topics Concern  . None   Social History Narrative  . None    Her Allergies Are:  Allergies  Allergen Reactions  . Benadryl [Diphenhydramine Hcl] Other (See Comments)    ALTERED MENTAL STATIS  . Chocolate   :   Her Current Medications Are:  Outpatient Encounter  Prescriptions as of 05/12/2013  Medication Sig  . albuterol (PROAIR HFA) 108 (90 BASE) MCG/ACT inhaler Inhale 2 puffs into the lungs every 6 (six) hours as needed for wheezing or shortness of breath.  Marland Kitchen alendronate (FOSAMAX) 70 MG tablet Take 1 tablet (70 mg total) by mouth once a week. Take with a full glass of water on an empty stomach.  . Fluticasone-Salmeterol (ADVAIR) 100-50 MCG/DOSE AEPB Inhale 1 puff into the lungs 2 (two) times daily.  Marland Kitchen levothyroxine (SYNTHROID, LEVOTHROID) 25 MCG tablet Take 1 tablet (25 mcg total) by mouth daily before breakfast.  . montelukast (SINGULAIR) 10 MG tablet Take 1 tablet (10 mg total) by mouth at bedtime.  . [DISCONTINUED] Fluticasone-Salmeterol (ADVAIR DISKUS IN) Inhale 1 puff into the lungs 2 (two) times daily.   Review of Systems:  Out of a complete 14 point review of systems, all are reviewed and negative with the exception of these symptoms as listed below:  Review of Systems  Constitutional: Negative.   HENT: Negative.   Eyes: Negative.   Respiratory: Negative.   Cardiovascular: Negative.   Gastrointestinal: Negative.   Endocrine: Negative.   Genitourinary: Negative.   Musculoskeletal: Negative.        Muscle cramps  Skin: Negative.   Allergic/Immunologic: Negative.   Neurological: Positive for dizziness.  Hematological: Negative.   Psychiatric/Behavioral: Negative.     Objective:  Neurologic Exam  Physical Exam Physical Examination:   Filed Vitals:   05/12/13 1128  BP: 116/59  Pulse: 80    General Examination: The patient is a very pleasant 79 y.o. female in no acute distress. She appears frail.   HEENT: Normocephalic, atraumatic, pupils are equal, round and reactive to light and accommodation. Hearing is mildly impaired bilaterally. Neck is supple with full range of motion. She has no symptoms of vertigo upon sudden changes in head position. Extraocular tracking is good without nystagmus noted. Speech is clear. Oropharynx  examination reveals mild mouth dryness otherwise no significant findings. Tongue protrudes centrally and palate elevates symmetrically. Neck auscultation reveals no carotid bruits.  Chest is clear to auscultation without wheezing or rhonchi noted.  Heart sounds are normal without murmurs, rubs or gallops noted. Abdomen is soft, nontender with normal bowel sounds noted. She has no pitting edema in the distal lower extremities. Skin is warm and dry. I do not appreciate any trophic changes and no joint deformities are noted and no joint swelling. She has mild arthritic changes in her hands and some stiffness and decrease in ROM in her R shoulder.  Neurologically: Mental status: The patient is awake, alert and oriented in all 4 spheres. Her memory, attention, language and knowledge are age appropriate. Cranial nerves are as described above. Motor exam reveals normal bulk and strength for age. She perhaps has a slight degree of hip flexor weakness bilaterally. Reflexes are 1+ throughout including her ankles. Cerebellar  testing shows no dysmetria or intention tremor on finger to nose testing and no truncal ataxia. Sensory exam is intact to light touch. Upon standing she has no significant lightheadedness. Romberg testing shows minimal swaying but no corrective steps. She walks fairly well with her walker, maneuvering it well.            Assessment and Plan:   In summary, Ms. Deeg is an 79 year old lady with a several year history of gait and balance problems. Her exam appears stable. She most likely has a multi-factorial gait disturbance d/t a combination of intermittent vertigo, arthritis, CNS atherosclerosis, orthostatic hypotension age and frailty. She uses her walker regularly and has not fallen since June. I advised her to use her walker at all times and no longer walk to the store as there are no proper sidewalks. She is advised to not walk for more than 10 minutes at a time. She and her niece were advised  to look into a life alert button. She is advised to make sure there are proper side rails for her stairs at home and to declutter her home as much is possible. Alternatively she can look into an independent living facilities such as a retirement community. She is advised to keep well hydrated and change positions slowly and in the mornings when she gets out of bed she is advised to sit up at the side of the bed for 30 seconds and then stand. She and her niece demonstrated understanding and voiced agreement. I will see her back in 6 months from now, sooner if the need arises. They are advised to call with any interim questions, concerns, problems or updates. Most of my 25 minute visit today was spent in counseling and coordination of care, reviewing test results and reviewing medication.

## 2013-05-12 NOTE — Patient Instructions (Addendum)
Please continue to use your walker at all times. Please do not walk to the store anymore. Please look into getting a life alert button or some other alert button, that you can wear.  De-clutter your home as much as possible. You need you put up side rails for your stairs at home.

## 2013-06-30 ENCOUNTER — Telehealth: Payer: Self-pay | Admitting: Neurology

## 2013-06-30 NOTE — Telephone Encounter (Signed)
Wants to ask Dr. About if her insurance will pay for her to have the life alert pin

## 2013-06-30 NOTE — Telephone Encounter (Signed)
Spoke to patient. Advised to contact Life Alert, (insurance)  Medicare, or PCP. Patient agreed.

## 2013-07-01 ENCOUNTER — Telehealth: Payer: Self-pay | Admitting: Nurse Practitioner

## 2013-07-02 NOTE — Telephone Encounter (Signed)
Will have to ask Pablo Lawrence

## 2013-07-08 NOTE — Telephone Encounter (Signed)
Referral made to [facility], for alert

## 2013-07-10 ENCOUNTER — Encounter: Payer: Self-pay | Admitting: Nurse Practitioner

## 2013-07-10 ENCOUNTER — Ambulatory Visit (INDEPENDENT_AMBULATORY_CARE_PROVIDER_SITE_OTHER): Payer: Medicare Other | Admitting: Nurse Practitioner

## 2013-07-10 ENCOUNTER — Ambulatory Visit (INDEPENDENT_AMBULATORY_CARE_PROVIDER_SITE_OTHER): Payer: Medicare Other

## 2013-07-10 VITALS — BP 125/63 | HR 89 | Temp 97.2°F | Ht 62.0 in | Wt 98.0 lb

## 2013-07-10 DIAGNOSIS — M25552 Pain in left hip: Secondary | ICD-10-CM

## 2013-07-10 DIAGNOSIS — M25559 Pain in unspecified hip: Secondary | ICD-10-CM

## 2013-07-10 DIAGNOSIS — M25519 Pain in unspecified shoulder: Secondary | ICD-10-CM

## 2013-07-10 DIAGNOSIS — M25511 Pain in right shoulder: Secondary | ICD-10-CM

## 2013-07-10 NOTE — Progress Notes (Signed)
   Subjective:    Patient ID: Sarah Boyd, female    DOB: [birth year], 80 y.o.   MRN: [id number]  HPI Patient had surgery on right shoulder last year sometime- she says that it is still sore- not taking physical therapy anymore.Saw orthopedic surgeon in November. SH eis also c/o left hip pain that is worse at night- says that pain radiates down leg.    Review of Systems  Constitutional: Negative.   HENT: Negative.   Respiratory: Negative.   Cardiovascular: Negative.   Gastrointestinal: Negative.   Musculoskeletal: Positive for arthralgias.  All other systems reviewed and are negative.       Objective:   Physical Exam  Constitutional: She appears well-developed and well-nourished.  Cardiovascular: Normal rate, regular rhythm and normal heart sounds.   Pulmonary/Chest: Effort normal and breath sounds normal.  Musculoskeletal:  deacrease ROM of right shoulder due to pain with any movement- unable to abduct at 45 degrees. FRO of left hip without pain.    BP 125/63  Pulse 89  Temp(Src) 97.2 F (36.2 C) (Oral)  Ht 5\' 2"  (1.575 m)  Wt 98 lb (44.453 kg)  BMI 17.92 kg/m2  Left hip - degenerative changes with joint space narrowing-Preliminary reading by Ronnald Collum, FNP  [facility]      Assessment & Plan:   1. Left hip pain   2. Right shoulder pain    Orders Placed This Encounter  Procedures  . DG Hip Complete Left    Standing Status: Future     Number of Occurrences: 1     Standing Expiration Date: 09/09/2014    Order Specific Question:  Reason for Exam (SYMPTOM  OR DIAGNOSIS REQUIRED)    Answer:  left hip pain    Order Specific Question:  Preferred imaging location?    Answer:  Internal  . Ambulatory referral to Orthopedic Surgery    Referral Priority:  Routine    Referral Type:  Surgical    Referral Reason:  Specialty Services Required    Referred to Provider:  Magnus Sinning, MD    Requested Specialty:  Orthopedic Surgery    Number of Visits Requested:  1    Continue current meds Etra strength tyleno OTC RTO prn  Mary-Margaret Hassell Done, FNP

## 2013-08-09 DIAGNOSIS — R262 Difficulty in walking, not elsewhere classified: Secondary | ICD-10-CM

## 2013-08-09 DIAGNOSIS — M1610 Unilateral primary osteoarthritis, unspecified hip: Secondary | ICD-10-CM

## 2013-08-09 DIAGNOSIS — I672 Cerebral atherosclerosis: Secondary | ICD-10-CM

## 2013-08-09 DIAGNOSIS — M169 Osteoarthritis of hip, unspecified: Secondary | ICD-10-CM

## 2013-08-09 DIAGNOSIS — J45909 Unspecified asthma, uncomplicated: Secondary | ICD-10-CM

## 2013-09-22 ENCOUNTER — Other Ambulatory Visit: Payer: Self-pay | Admitting: *Deleted

## 2013-09-22 MED ORDER — FLUTICASONE-SALMETEROL 100-50 MCG/DOSE IN AEPB: 1.0000 | INHALATION_SPRAY | Freq: Two times a day (BID) | RESPIRATORY_TRACT | Status: DC

## 2013-09-26 ENCOUNTER — Encounter: Payer: Self-pay | Admitting: *Deleted

## 2013-09-26 NOTE — Progress Notes (Deleted)
Subjective:    Patient ID: Sarah Boyd is a 80 y.o. female.  HPI {Common ambulatory SmartLinks:[id number]}  Review of Systems  Objective:  Neurologic Exam  Physical Exam  Assessment:   ***  Plan:   ***

## 2013-09-26 NOTE — Progress Notes (Deleted)
Subjective:    Patient ID: Sarah Boyd is a 80 y.o. female.  HPI {Common ambulatory SmartLinks:[id number]}  Review of Systems  Objective:  Neurologic Exam  Physical Exam  Assessment:   ***  Plan:   ***    

## 2013-09-26 NOTE — Progress Notes (Signed)
Form for Patient Care Services needs to be done by pcp per Dr. Rexene Alberts.

## 2013-10-24 ENCOUNTER — Encounter: Payer: Self-pay | Admitting: Neurology

## 2013-10-24 ENCOUNTER — Telehealth: Payer: Self-pay | Admitting: Neurology

## 2013-10-24 NOTE — Telephone Encounter (Signed)
Left message for patient regarding rescheduling 11/10/13 appointment per Dr. Guadelupe Sabin schedule, ok'd by Jeani Hawking to put on her schedule. Printed and sent letter with new appointment time.

## 2013-10-27 ENCOUNTER — Telehealth (INDEPENDENT_AMBULATORY_CARE_PROVIDER_SITE_OTHER): Payer: Medicare Other | Admitting: Nurse Practitioner

## 2013-10-27 ENCOUNTER — Other Ambulatory Visit: Payer: Self-pay

## 2013-10-27 DIAGNOSIS — Z9010 Acquired absence of unspecified breast and nipple: Secondary | ICD-10-CM

## 2013-10-27 DIAGNOSIS — Z853 Personal history of malignant neoplasm of breast: Secondary | ICD-10-CM

## 2013-10-27 DIAGNOSIS — Z1231 Encounter for screening mammogram for malignant neoplasm of breast: Secondary | ICD-10-CM

## 2013-10-27 DIAGNOSIS — Z9011 Acquired absence of right breast and nipple: Secondary | ICD-10-CM

## 2013-10-27 NOTE — Telephone Encounter (Signed)
rx sent to pharmacy

## 2013-10-29 ENCOUNTER — Telehealth: Payer: Self-pay | Admitting: *Deleted

## 2013-10-29 ENCOUNTER — Other Ambulatory Visit: Payer: Self-pay | Admitting: *Deleted

## 2013-10-29 MED ORDER — MONTELUKAST SODIUM 10 MG PO TABS: 10.0000 mg | ORAL_TABLET | Freq: Every day | ORAL | Status: DC

## 2013-10-29 NOTE — Telephone Encounter (Signed)
Patient niece called need to know why Doctor will not fill out form. Telephone # [phone] call her back ASAP.

## 2013-10-29 NOTE — Telephone Encounter (Signed)
I called and spoke to pt.  She and niece will address when in on 11-24-13 with NP L Lam.  Niece to call back if needed.

## 2013-11-02 ENCOUNTER — Other Ambulatory Visit: Payer: Self-pay | Admitting: *Deleted

## 2013-11-02 MED ORDER — ALENDRONATE SODIUM 70 MG PO TABS: 70.0000 mg | ORAL_TABLET | ORAL | Status: DC

## 2013-11-10 ENCOUNTER — Ambulatory Visit: Payer: Medicare Other | Admitting: Neurology

## 2013-11-14 ENCOUNTER — Other Ambulatory Visit: Payer: Self-pay | Admitting: Orthopedic Surgery

## 2013-11-14 DIAGNOSIS — M48061 Spinal stenosis, lumbar region without neurogenic claudication: Secondary | ICD-10-CM

## 2013-11-17 ENCOUNTER — Other Ambulatory Visit: Payer: Self-pay | Admitting: Orthopedic Surgery

## 2013-11-17 DIAGNOSIS — M48061 Spinal stenosis, lumbar region without neurogenic claudication: Secondary | ICD-10-CM

## 2013-11-19 ENCOUNTER — Ambulatory Visit: Payer: Medicare Other | Admitting: Nurse Practitioner

## 2013-11-21 ENCOUNTER — Ambulatory Visit
Admission: RE | Admit: 2013-11-21 | Discharge: 2013-11-21 | Disposition: A | Discharge status: Home / Self Care | Payer: Commercial Managed Care - HMO | Source: Ambulatory Visit | Attending: Orthopedic Surgery | Admitting: Orthopedic Surgery

## 2013-11-21 ENCOUNTER — Ambulatory Visit
Admission: RE | Admit: 2013-11-21 | Discharge: 2013-11-21 | Disposition: A | Discharge status: Home / Self Care | Payer: Self-pay | Source: Ambulatory Visit | Attending: Orthopedic Surgery | Admitting: Orthopedic Surgery

## 2013-11-21 ENCOUNTER — Other Ambulatory Visit: Payer: Self-pay | Admitting: Orthopedic Surgery

## 2013-11-21 VITALS — BP 128/69 | HR 72

## 2013-11-21 DIAGNOSIS — M48061 Spinal stenosis, lumbar region without neurogenic claudication: Secondary | ICD-10-CM

## 2013-11-21 DIAGNOSIS — R52 Pain, unspecified: Secondary | ICD-10-CM

## 2013-11-21 MED ORDER — IOHEXOL 180 MG/ML  SOLN
18.0000 mL | Freq: Once | INTRAMUSCULAR | Status: AC | PRN
  Administered 2013-11-21: 18 mL via INTRATHECAL

## 2013-11-21 MED ORDER — HYDROCODONE-ACETAMINOPHEN 5-325 MG PO TABS
1.0000 | ORAL_TABLET | Freq: Once | ORAL | Status: AC
  Administered 2013-11-21: 1 via ORAL

## 2013-11-21 NOTE — Progress Notes (Signed)
Patient states she has been off Tramadol for at least the past two days.  Jeanne Lohr, RN 

## 2013-11-21 NOTE — Discharge Instructions (Signed)
Myelogram Discharge Instructions  1. Go home and rest quietly for the next 24 hours.  It is important to lie flat for the next 24 hours.  Get up only to go to the restroom.  You may lie in the bed or on a couch on your back, your stomach, your left side or your right side.  You may have one pillow under your head.  You may have pillows between your knees while you are on your side or under your knees while you are on your back.  2. DO NOT drive today.  Recline the seat as far back as it will go, while still wearing your seat belt, on the way home.  3. You may get up to go to the bathroom as needed.  You may sit up for 10 minutes to eat.  You may resume your normal diet and medications unless otherwise indicated.  Drink lots of extra fluids today and tomorrow.  4. The incidence of headache, nausea, or vomiting is about 5% (one in 20 patients).  If you develop a headache, lie flat and drink plenty of fluids until the headache goes away.  Caffeinated beverages may be helpful.  If you develop severe nausea and vomiting or a headache that does not go away with flat bed rest, call [phone].  5. You may resume normal activities after your 24 hours of bed rest is over; however, do not exert yourself strongly or do any heavy lifting tomorrow. If when you get up you have a headache when standing, go back to bed and force fluids for another 24 hours.  6. Call your physician for a follow-up appointment.  The results of your myelogram will be sent directly to your physician by the following day.  7. If you have any questions or if complications develop after you arrive home, please call [phone].  Discharge instructions have been explained to the patient.  The patient, or the person responsible for the patient, fully understands these instructions.       May resume Tramadol on November 22, 2013, after 9:30 am.

## 2013-11-24 ENCOUNTER — Ambulatory Visit (INDEPENDENT_AMBULATORY_CARE_PROVIDER_SITE_OTHER): Payer: Commercial Managed Care - HMO | Admitting: Nurse Practitioner

## 2013-11-24 ENCOUNTER — Encounter: Payer: Self-pay | Admitting: Nurse Practitioner

## 2013-11-24 VITALS — BP 140/70 | HR 74 | Temp 98.7°F | Ht 61.0 in | Wt 98.0 lb

## 2013-11-24 DIAGNOSIS — Z9181 History of falling: Secondary | ICD-10-CM

## 2013-11-24 DIAGNOSIS — R42 Dizziness and giddiness: Secondary | ICD-10-CM

## 2013-11-24 DIAGNOSIS — R269 Unspecified abnormalities of gait and mobility: Secondary | ICD-10-CM

## 2013-11-24 DIAGNOSIS — R296 Repeated falls: Secondary | ICD-10-CM

## 2013-11-24 MED ORDER — MECLIZINE HCL 25 MG PO TABS: 25.0000 mg | ORAL_TABLET | Freq: Three times a day (TID) | ORAL | Status: DC | PRN

## 2013-11-24 NOTE — Progress Notes (Addendum)
PATIENT: Sarah Boyd DOB: Aug 23, 1929  REASON FOR VISIT: routine follow up for gait disorder, dizziness HISTORY FROM: patient  HISTORY OF PRESENT ILLNESS: Sarah Boyd is an 80 year old right-handed woman who presents for followup consultation of her gait disturbance, balance problems and falls.   UPDATE 11/24/13 (LL):  Sarah Boyd comes in for follow up accompanied by her niece, [name].  Since last visit she has not had any falls.  She is using her cane more than her walker. She was able to get a life alert button system.  She is living alone with help from [place].  She requests her application for a home health aide through DSS be completed.  She had a Thoracic and Lumbar Spine Myelogram with Dr. Gladstone Lighter completed on June 12 to evaluate for spinal stenosis; they do not know the results of yet.  She continues to have intermittent dizziness.  05/12/13 (SA):  She is accompanied by her niece, Sarah Boyd, again today. I last saw her on 11/05/2012, in which time I asked her to use her walker at all times. I also advised her to look into a life alert button. I felt, she most likely had a multifactorial gait disorder, d/t a combination of intermittent vertigo, arthritis, CNS atherosclerosis, and orthostatic hypotension. In the interim, she fell on 12/01/2012 and broke a rib. She fell against the dresser. Her niece goes to see her at least once a week and helps her with groceries. The patient loves to walk and has been using her walker consistently. She has walked to [employer] and to [employer]. She has been through PT.  I first met her on 08/05/2012, at which time she gave a several year history of gait and balance problems. She fell in 2012 and fractured both wrists. Years ago she fell off the porch and hurt her right shoulder. She has a history of breast cancer on the right, status post right mastectomy and chemotherapy. She was also complaining of lightheadedness upon standing quickly. She has an underlying  medical history of hypothyroidism, osteoporosis, chronic lung disease, past history of breast cancer, status post right mastectomy and chemotherapy. At the time of her first visit with me she displayed mild gait insecurity but no frank ataxia. I felt she had multifactorial gait disturbance due to arthritis, cerebrovascular atherosclerosis, orthostatic hypotension, advanced age, and possible intermittent vertigo. I suggested a brain MRI with and without contrast and recommended physical therapy for gait and balance training. She was advised to use meclizine as needed. She has not been using it and was not sure if it was helpful.  Her brain MRI from 08/15/12 showed mild changes of age-appropriate generalized cerebral atrophy and chronic microvascular ischemia. She lives in a 2 storey house by herself. If she gets up too quickly, she feels off balance and lightheaded. She was in PT in [place], [place], but I do not have a report available.  She recently had a check up with her PCP.   REVIEW OF SYSTEMS: Full 14 system review of systems performed and notable only for: dizziness, back pain, joint pain.  ALLERGIES: Allergies  Allergen Reactions  . Benadryl [Diphenhydramine Hcl] Other (See Comments)    ALTERED MENTAL STATIS  . Chocolate     HOME MEDICATIONS: Outpatient Prescriptions Prior to Visit  Medication Sig Dispense Refill  . albuterol (PROAIR HFA) 108 (90 BASE) MCG/ACT inhaler Inhale 2 puffs into the lungs every 6 (six) hours as needed for wheezing or shortness of breath.  1 each  1  . alendronate (FOSAMAX) 70 MG tablet Take 1 tablet (70 mg total) by mouth once a week. Take with a full glass of water on an empty stomach.  4 tablet  1  . Fluticasone-Salmeterol (ADVAIR) 100-50 MCG/DOSE AEPB Inhale 1 puff into the lungs 2 (two) times daily.  1 each  3  . levothyroxine (SYNTHROID, LEVOTHROID) 25 MCG tablet Take 1 tablet (25 mcg total) by mouth daily before breakfast.  30 tablet  8  . montelukast (SINGULAIR)  10 MG tablet Take 1 tablet (10 mg total) by mouth at bedtime.  30 tablet  1   No facility-administered medications prior to visit.     PHYSICAL EXAM  Filed Vitals:   11/24/13 1128  BP: 140/70  Pulse: 74  Temp: 98.7 F (37.1 C)  TempSrc: Oral  Height: _0  (1.549 m)  Weight: 98 lb (44.453 kg)   Body mass index is 18.53 kg/(m^2). No exam data present   Generalized: Well developed, in no acute distress, elderly AA female.  Head: normocephalic and atraumatic. Oropharynx benign  Neck: Supple, no carotid bruits  Cardiac: Regular rate rhythm, no murmur  Musculoskeletal: She has mild arthritic changes in her hands and some stiffness and decrease in ROM in her R shoulder.   Neurological examination  Mentation: Her memory, attention, language and knowledge are age appropriate. Follows all commands speech and language fluent Cranial nerve II-XII: Pupils were equal round reactive to light extraocular movements were full, visual field were full on confrontational test. Facial sensation and strength were normal. hearing was intact to finger rubbing bilaterally. Uvula tongue midline. head turning and shoulder shrug and were normal and symmetric.Tongue protrusion into cheek strength was normal. Motor: The motor testing reveals 5 over 5 strength of all 4 extremities. Good symmetric motor tone is noted throughout.  Sensory: Sensory testing is intact to soft touch on all 4 extremities. No evidence of extinction is noted.  Coordination: Cerebellar testing reveals good finger-nose-finger and heel-to-shin bilaterally.  Gait and station: Upon standing she has no significant lightheadedness. Romberg testing shows minimal swaying but no corrective steps.  She walks slowly and carefully with a cane. Reflexes: Reflexes are 1+ throughout including her ankles.    ASSESSMENT AND PLAN In summary, Sarah Boyd is an 80 year old lady with a several year history of gait and balance problems. Her exam appears  stable. She most likely has a multi-factorial gait disturbance d/t a combination of intermittent vertigo, arthritis, CNS atherosclerosis, orthostatic hypotension age and frailty. She uses her walker regularly and has not fallen since last June. I advised her to use her walker at all times instead of the cane and no longer walk to the store alone.  She is advised to not walk for more than 10 minutes at a time.  She is advised to make sure there are proper side rails for her stairs at home and to declutter her home as much is possible. She would like to live independently as long as she is able; then [name] plans to move her into her home.  She does not plan to every have her Aunt live in a nursing home. She is advised to keep well hydrated and change positions slowly and in the mornings when she gets out of bed she is advised to sit up at the side of the bed for 30 seconds and then stand. She and her niece demonstrated understanding and voiced agreement. I will see her back in 6 months from now, sooner if  the need arises. They are advised to call with any interim questions, concerns, problems or updates.  I will reorder her Meclizine. The DSS application for home health aide was completed during this visit.  Meds ordered this encounter  Medications  . meclizine (ANTIVERT) 25 MG tablet    Sig: Take 1 tablet (25 mg total) by mouth 3 (three) times daily as needed.    Dispense:  90 tablet    Refill:  5    Order Specific Question:  Supervising Provider    Answer:  Penni Bombard [[id number]]   Return in about 6 months (around 05/26/2014) for gait disorder, dizziness.  Philmore Pali, MSN, NP-C 11/24/2013, 12:33 PM [facility] [address] [place], [place] [address] [phone]  Note: This document was prepared with digital dictation and possible smart phrase technology. Any transcriptional errors that result from this process are unintentional. I reviewed the above note and  documentation by the Nurse Practitioner and agree with the history, physical exam, assessment and plan as outlined above.

## 2013-11-24 NOTE — Patient Instructions (Signed)
She most likely has a multi-factorial gait disturbance d/t a combination of intermittent vertigo, arthritis, CNS atherosclerosis, orthostatic hypotension age and frailty.  I advised her to use her walker at all times and no longer walk to the store as there are no proper sidewalks. She is advised to not walk for more than 10 minutes at a time.  She is advised to make sure there are proper side rails for her stairs at home and to declutter her home as much is possible. Alternatively she can look into an independent living facilities such as a retirement community. She is advised to keep well hydrated and change positions slowly and in the mornings when she gets out of bed she is advised to sit up at the side of the bed for 30 seconds and then stand.  Dr. Rexene Alberts will see her back in 6 months from now, sooner if the need arises. They are advised to call with any interim questions, concerns, problems or updates.    Fall Prevention and Home Safety Falls cause injuries and can affect all age groups. It is possible to use preventive measures to significantly decrease the likelihood of falls. There are many simple measures which can make your home safer and prevent falls. OUTDOORS  Repair cracks and edges of walkways and driveways.  Remove high doorway thresholds.  Trim shrubbery on the main path into your home.  Have good outside lighting.  Clear walkways of tools, rocks, debris, and clutter.  Check that handrails are not broken and are securely fastened. Both sides of steps should have handrails.  Have leaves, snow, and ice cleared regularly.  Use sand or salt on walkways during winter months.  In the garage, clean up grease or oil spills. BATHROOM  Install night lights.  Install grab bars by the toilet and in the tub and shower.  Use non-skid mats or decals in the tub or shower.  Place a plastic non-slip stool in the shower to sit on, if needed.  Keep floors dry and clean up all water on  the floor immediately.  Remove soap buildup in the tub or shower on a regular basis.  Secure bath mats with non-slip, double-sided rug tape.  Remove throw rugs and tripping hazards from the floors. BEDROOMS  Install night lights.  Make sure a bedside light is easy to reach.  Do not use oversized bedding.  Keep a telephone by your bedside.  Have a firm chair with side arms to use for getting dressed.  Remove throw rugs and tripping hazards from the floor. KITCHEN  Keep handles on pots and pans turned toward the center of the stove. Use back burners when possible.  Clean up spills quickly and allow time for drying.  Avoid walking on wet floors.  Avoid hot utensils and knives.  Position shelves so they are not too high or low.  Place commonly used objects within easy reach.  If necessary, use a sturdy step stool with a grab bar when reaching.  Keep electrical cables out of the way.  Do not use floor polish or wax that makes floors slippery. If you must use wax, use non-skid floor wax.  Remove throw rugs and tripping hazards from the floor. STAIRWAYS  Never leave objects on stairs.  Place handrails on both sides of stairways and use them. Fix any loose handrails. Make sure handrails on both sides of the stairways are as long as the stairs.  Check carpeting to make sure it is firmly attached  along stairs. Make repairs to worn or loose carpet promptly.  Avoid placing throw rugs at the top or bottom of stairways, or properly secure the rug with carpet tape to prevent slippage. Get rid of throw rugs, if possible.  Have an electrician put in a light switch at the top and bottom of the stairs. OTHER FALL PREVENTION TIPS  Wear low-heel or rubber-soled shoes that are supportive and fit well. Wear closed toe shoes.  When using a stepladder, make sure it is fully opened and both spreaders are firmly locked. Do not climb a closed stepladder.  Add color or contrast paint or  tape to grab bars and handrails in your home. Place contrasting color strips on first and last steps.  Learn and use mobility aids as needed. Install an electrical emergency response system.  Turn on lights to avoid dark areas. Replace light bulbs that burn out immediately. Get light switches that glow.  Arrange furniture to create clear pathways. Keep furniture in the same place.  Firmly attach carpet with non-skid or double-sided tape.  Eliminate uneven floor surfaces.  Select a carpet pattern that does not visually hide the edge of steps.  Be aware of all pets. OTHER HOME SAFETY TIPS  Set the water temperature for 120 F (48.8 C).  Keep emergency numbers on or near the telephone.  Keep smoke detectors on every level of the home and near sleeping areas. Document Released: 05/19/2002 Document Revised: 11/28/2011 Document Reviewed: 08/18/2011 [facility] Patient Information 2014 [place].

## 2013-12-04 ENCOUNTER — Ambulatory Visit
Admission: RE | Admit: 2013-12-04 | Discharge: 2013-12-04 | Disposition: A | Discharge status: Home / Self Care | Payer: Commercial Managed Care - HMO | Source: Ambulatory Visit

## 2013-12-04 DIAGNOSIS — Z853 Personal history of malignant neoplasm of breast: Secondary | ICD-10-CM

## 2013-12-04 DIAGNOSIS — Z9011 Acquired absence of right breast and nipple: Secondary | ICD-10-CM

## 2013-12-04 DIAGNOSIS — Z1231 Encounter for screening mammogram for malignant neoplasm of breast: Secondary | ICD-10-CM

## 2013-12-05 ENCOUNTER — Other Ambulatory Visit: Payer: Self-pay | Admitting: Oncology

## 2013-12-05 DIAGNOSIS — R928 Other abnormal and inconclusive findings on diagnostic imaging of breast: Secondary | ICD-10-CM

## 2013-12-15 ENCOUNTER — Other Ambulatory Visit: Payer: Self-pay | Admitting: Family Medicine

## 2013-12-15 DIAGNOSIS — R928 Other abnormal and inconclusive findings on diagnostic imaging of breast: Secondary | ICD-10-CM

## 2013-12-15 NOTE — Progress Notes (Signed)
Additional imaging scheduled for 12/16/13

## 2013-12-16 ENCOUNTER — Ambulatory Visit
Admission: RE | Admit: 2013-12-16 | Discharge: 2013-12-16 | Disposition: A | Discharge status: Home / Self Care | Payer: Commercial Managed Care - HMO | Source: Ambulatory Visit | Attending: Oncology | Admitting: Oncology

## 2013-12-16 DIAGNOSIS — R928 Other abnormal and inconclusive findings on diagnostic imaging of breast: Secondary | ICD-10-CM

## 2013-12-27 ENCOUNTER — Other Ambulatory Visit: Payer: Self-pay | Admitting: Family Medicine

## 2013-12-29 NOTE — Telephone Encounter (Signed)
Do not see any thyroid results in Epic. Orders were placed but never collected. Please advise on refill

## 2013-12-29 NOTE — Telephone Encounter (Signed)
Patient NTBS for follow up and lab work  

## 2013-12-31 ENCOUNTER — Other Ambulatory Visit: Payer: Self-pay | Admitting: Nurse Practitioner

## 2014-01-22 ENCOUNTER — Encounter: Payer: Self-pay | Admitting: Nurse Practitioner

## 2014-01-22 ENCOUNTER — Ambulatory Visit (INDEPENDENT_AMBULATORY_CARE_PROVIDER_SITE_OTHER): Payer: Commercial Managed Care - HMO | Admitting: Nurse Practitioner

## 2014-01-22 ENCOUNTER — Other Ambulatory Visit: Payer: Self-pay | Admitting: Nurse Practitioner

## 2014-01-22 VITALS — BP 129/63 | HR 76 | Temp 97.7°F | Ht 60.0 in | Wt 97.4 lb

## 2014-01-22 DIAGNOSIS — M810 Age-related osteoporosis without current pathological fracture: Secondary | ICD-10-CM

## 2014-01-22 DIAGNOSIS — Z23 Encounter for immunization: Secondary | ICD-10-CM

## 2014-01-22 DIAGNOSIS — E0789 Other specified disorders of thyroid: Secondary | ICD-10-CM

## 2014-01-22 DIAGNOSIS — E038 Other specified hypothyroidism: Secondary | ICD-10-CM

## 2014-01-22 DIAGNOSIS — E034 Atrophy of thyroid (acquired): Secondary | ICD-10-CM

## 2014-01-22 DIAGNOSIS — J4520 Mild intermittent asthma, uncomplicated: Secondary | ICD-10-CM

## 2014-01-22 DIAGNOSIS — J45909 Unspecified asthma, uncomplicated: Secondary | ICD-10-CM

## 2014-01-22 MED ORDER — FLUTICASONE-SALMETEROL 100-50 MCG/DOSE IN AEPB: 1.0000 | INHALATION_SPRAY | Freq: Two times a day (BID) | RESPIRATORY_TRACT | Status: DC

## 2014-01-22 MED ORDER — MONTELUKAST SODIUM 10 MG PO TABS: 10.0000 mg | ORAL_TABLET | Freq: Every day | ORAL | Status: DC

## 2014-01-22 MED ORDER — LEVOTHYROXINE SODIUM 25 MCG PO TABS: ORAL_TABLET | ORAL | Status: DC

## 2014-01-22 MED ORDER — ALENDRONATE SODIUM 70 MG PO TABS: ORAL_TABLET | ORAL | Status: DC

## 2014-01-22 NOTE — Patient Instructions (Signed)

## 2014-01-22 NOTE — Progress Notes (Signed)
Subjective:    Patient ID: Sarah Boyd, female    DOB: [birth year], 80 y.o.   MRN: [id number]  HPI Patient in today for follow up of chronic medical problems. SHe is doing well. No changes since last visit other than she is starting to be a little weak and is now walking with a walker. Hypothyroidism Currently on synthroid 37mg daily- no complaints Asthma Takes singulair and advair daily has not need albuterol in quite sometime. Osteoporosis Fosamax weekly- has some back pain that she takes ultram for.  Review of Systems  Constitutional: Negative.   HENT: Negative.   Respiratory: Negative.   Genitourinary: Negative.   Neurological: Negative.   Psychiatric/Behavioral: Negative.   All other systems reviewed and are negative.      Objective:   Physical Exam  Constitutional: She is oriented to person, place, and time. She appears well-developed and well-nourished.  HENT:  Nose: Nose normal.  Mouth/Throat: Oropharynx is clear and moist.  Eyes: EOM are normal.  Neck: Trachea normal, normal range of motion and full passive range of motion without pain. Neck supple. No JVD present. Carotid bruit is not present. No thyromegaly present.  Cardiovascular: Normal rate, regular rhythm, normal heart sounds and intact distal pulses.  Exam reveals no gallop and no friction rub.   No murmur heard. Pulmonary/Chest: Effort normal and breath sounds normal.  Abdominal: Soft. Bowel sounds are normal. She exhibits no distension and no mass. There is no tenderness.  Musculoskeletal: Normal range of motion.  Patient walking with walker- steady and slow  Lymphadenopathy:    She has no cervical adenopathy.  Neurological: She is alert and oriented to person, place, and time. She has normal reflexes.  Skin: Skin is warm and dry.  Psychiatric: She has a normal mood and affect. Her behavior is normal. Judgment and thought content normal.   BP 129/63  Pulse 76  Temp(Src) 97.7 F (36.5 C) (Oral)   Ht 5' (1.524 m)  Wt 97 lb 6.4 oz (44.18 kg)  BMI 19.02 kg/m2        Assessment & Plan:   1. Osteoporosis   2. Hypothyroidism due to acquired atrophy of thyroid   3. Asthma, chronic, mild intermittent, uncomplicated    Orders Placed This Encounter  Procedures  . CMP14+EGFR  . NMR, lipoprofile  . Thyroid Panel With TSH   Meds ordered this encounter  Medications  . montelukast (SINGULAIR) 10 MG tablet    Sig: Take 1 tablet (10 mg total) by mouth at bedtime.    Dispense:  30 tablet    Refill:  5    Order Specific Question:  Supervising Provider    Answer:  MChipper Herb[[id number]]  . levothyroxine (SYNTHROID, LEVOTHROID) 25 MCG tablet    Sig: TAKE 1 TABLET EVERY MORNING ON AN EMPTY STOMACH    Dispense:  30 tablet    Refill:  5    Needs to be seen before next refill    Order Specific Question:  Supervising Provider    Answer:  MChipper Herb[[id number]]  . Fluticasone-Salmeterol (ADVAIR) 100-50 MCG/DOSE AEPB    Sig: Inhale 1 puff into the lungs 2 (two) times daily.    Dispense:  1 each    Refill:  5    Order Specific Question:  Supervising Provider    Answer:  MChipper Herb[[id number]]  . alendronate (FOSAMAX) 70 MG tablet    Sig: TAKE 1 TABLET BY MOUTH ONCE A WEEK WITH A FULL  GLASS OF WATER ON AN EMPTY STOMACH    Dispense:  4 tablet    Refill:  5    Order Specific Question:  Supervising Provider    Answer:  Chipper Herb [[id number]]    Labs pending Health maintenance reviewed Diet and exercise encouraged Continue all meds Follow up  In 6 months   [place], FNP

## 2014-01-23 LAB — CMP14+EGFR
ALBUMIN: 4.4 g/dL (ref 3.5–4.7)
ALK PHOS: 53 IU/L (ref 39–117)
ALT: 13 IU/L (ref 0–32)
AST: 22 IU/L (ref 0–40)
Albumin/Globulin Ratio: 1.8 (ref 1.1–2.5)
BUN / CREAT RATIO: 16 (ref 11–26)
BUN: 16 mg/dL (ref 8–27)
CO2: 26 mmol/L (ref 18–29)
Calcium: 9.2 mg/dL (ref 8.7–10.3)
Chloride: 102 mmol/L (ref 97–108)
Creatinine, Ser: 0.98 mg/dL (ref 0.57–1.00)
GFR, EST AFRICAN AMERICAN: 63 mL/min/{1.73_m2} (ref >59)
GFR, EST NON AFRICAN AMERICAN: 54 mL/min/{1.73_m2} — AB (ref >59)
GLOBULIN, TOTAL: 2.4 g/dL (ref 1.5–4.5)
Glucose: 79 mg/dL (ref 65–99)
Potassium: 4.1 mmol/L (ref 3.5–5.2)
Sodium: 142 mmol/L (ref 134–144)
Total Bilirubin: 0.3 mg/dL (ref 0.0–1.2)
Total Protein: 6.8 g/dL (ref 6.0–8.5)

## 2014-01-23 LAB — NMR, LIPOPROFILE

## 2014-01-23 LAB — THYROID PANEL WITH TSH
FREE THYROXINE INDEX: 1.9 (ref 1.2–4.9)
T3 Uptake Ratio: 26 % (ref 24–39)
T4, Total: 7.4 ug/dL (ref 4.5–12.0)
TSH: 1.6 u[IU]/mL (ref 0.450–4.500)

## 2014-01-23 NOTE — Addendum Note (Signed)
Addended by: Waverly Ferrari on: 01/23/2014 08:08 AM   Modules accepted: Orders

## 2014-01-26 LAB — LIPID PANEL WITH LDL/HDL RATIO
Cholesterol, Total: 191 mg/dL (ref 100–199)
HDL: 100 mg/dL (ref >39)
LDL CALC: 71 mg/dL (ref 0–99)
LDL/HDL RATIO: 0.7 ratio (ref 0.0–3.2)
Triglycerides: 102 mg/dL (ref 0–149)
VLDL Cholesterol Cal: 20 mg/dL (ref 5–40)

## 2014-01-26 LAB — NMR, LIPOPROFILE
CHOLESTEROL: 190 mg/dL (ref 100–199)
HDL Cholesterol by NMR: 102 mg/dL (ref >39)
HDL Particle Number: 37.7 umol/L (ref >=30.5)
LDL Particle Number: 537 nmol/L (ref <1000)
LDL SIZE: 21.5 nm (ref >20.5)
LDLC SERPL CALC-MCNC: 67 mg/dL (ref 0–99)
LP-IR Score: <25 (ref <=45)
Small LDL Particle Number: <90 nmol/L (ref <=527)
TRIGLYCERIDES BY NMR: 103 mg/dL (ref 0–149)

## 2014-01-26 LAB — SPECIMEN STATUS REPORT

## 2014-01-27 ENCOUNTER — Telehealth: Payer: Self-pay | Admitting: *Deleted

## 2014-01-27 NOTE — Telephone Encounter (Signed)
Message copied by Marin Olp on Tue Jan 27, 2014  2:39 PM ------      Message from: Chevis Pretty      Created: Tue Jan 27, 2014  8:41 AM       Cholesterol looks great ------

## 2014-01-27 NOTE — Telephone Encounter (Signed)
Pt notified of results Verbalizes understanding 

## 2014-04-09 ENCOUNTER — Ambulatory Visit (INDEPENDENT_AMBULATORY_CARE_PROVIDER_SITE_OTHER): Payer: Commercial Managed Care - HMO

## 2014-04-09 DIAGNOSIS — Z23 Encounter for immunization: Secondary | ICD-10-CM

## 2014-05-25 ENCOUNTER — Emergency Department (HOSPITAL_COMMUNITY)
Admission: EM | Admit: 2014-05-25 | Discharge: 2014-05-25 | Disposition: A | Discharge status: Home / Self Care | Payer: Commercial Managed Care - HMO | Attending: Emergency Medicine | Admitting: Emergency Medicine

## 2014-05-25 ENCOUNTER — Emergency Department (HOSPITAL_COMMUNITY): Payer: Commercial Managed Care - HMO

## 2014-05-25 ENCOUNTER — Telehealth: Payer: Self-pay | Admitting: Nurse Practitioner

## 2014-05-25 ENCOUNTER — Encounter (HOSPITAL_COMMUNITY): Payer: Self-pay | Admitting: Emergency Medicine

## 2014-05-25 DIAGNOSIS — Z8669 Personal history of other diseases of the nervous system and sense organs: Secondary | ICD-10-CM | POA: Diagnosis not present

## 2014-05-25 DIAGNOSIS — Z7952 Long term (current) use of systemic steroids: Secondary | ICD-10-CM | POA: Insufficient documentation

## 2014-05-25 DIAGNOSIS — Z79899 Other long term (current) drug therapy: Secondary | ICD-10-CM | POA: Diagnosis not present

## 2014-05-25 DIAGNOSIS — J45909 Unspecified asthma, uncomplicated: Secondary | ICD-10-CM | POA: Diagnosis not present

## 2014-05-25 DIAGNOSIS — R059 Cough, unspecified: Secondary | ICD-10-CM

## 2014-05-25 DIAGNOSIS — Z853 Personal history of malignant neoplasm of breast: Secondary | ICD-10-CM | POA: Diagnosis not present

## 2014-05-25 DIAGNOSIS — R079 Chest pain, unspecified: Secondary | ICD-10-CM | POA: Diagnosis not present

## 2014-05-25 DIAGNOSIS — Z87828 Personal history of other (healed) physical injury and trauma: Secondary | ICD-10-CM | POA: Diagnosis not present

## 2014-05-25 DIAGNOSIS — E079 Disorder of thyroid, unspecified: Secondary | ICD-10-CM | POA: Diagnosis not present

## 2014-05-25 DIAGNOSIS — J45901 Unspecified asthma with (acute) exacerbation: Secondary | ICD-10-CM

## 2014-05-25 DIAGNOSIS — R05 Cough: Secondary | ICD-10-CM

## 2014-05-25 MED ORDER — ALBUTEROL (5 MG/ML) CONTINUOUS INHALATION SOLN
10.0000 mg/h | INHALATION_SOLUTION | Freq: Once | RESPIRATORY_TRACT | Status: AC
  Administered 2014-05-25: 10 mg/h via RESPIRATORY_TRACT
  Filled 2014-05-25: qty 20

## 2014-05-25 MED ORDER — PREDNISONE 50 MG PO TABS
60.0000 mg | ORAL_TABLET | Freq: Once | ORAL | Status: AC
  Administered 2014-05-25: 60 mg via ORAL
  Filled 2014-05-25 (×2): qty 1

## 2014-05-25 MED ORDER — ALBUTEROL SULFATE (2.5 MG/3ML) 0.083% IN NEBU
2.5000 mg | INHALATION_SOLUTION | Freq: Once | RESPIRATORY_TRACT | Status: AC
  Administered 2014-05-25: 2.5 mg via RESPIRATORY_TRACT
  Filled 2014-05-25: qty 3

## 2014-05-25 MED ORDER — IPRATROPIUM-ALBUTEROL 0.5-2.5 (3) MG/3ML IN SOLN
3.0000 mL | Freq: Once | RESPIRATORY_TRACT | Status: AC
  Administered 2014-05-25: 3 mL via RESPIRATORY_TRACT
  Filled 2014-05-25: qty 3

## 2014-05-25 MED ORDER — PREDNISONE 50 MG PO TABS: ORAL_TABLET | ORAL | Status: DC

## 2014-05-25 NOTE — Discharge Instructions (Signed)

## 2014-05-25 NOTE — ED Notes (Signed)
Patient given discharge instruction, verbalized understand. Patient ambulatory out of the department.  

## 2014-05-25 NOTE — ED Notes (Signed)
Pt was ambulated and stated that she was feeling fine.

## 2014-05-25 NOTE — ED Notes (Signed)
Pt reports cough x 2-3 days, productive with thick yellow sputum. No fever. No emesis.

## 2014-05-25 NOTE — ED Provider Notes (Signed)
CSN: [id number]     Arrival date & time 05/25/14  1147 History  This chart was scribed for Sarah Cable, MD by Stephania Fragmin, ED Scribe. This patient was seen in room APA09/APA09 and the patient's care was started at 3:34 PM.    Chief Complaint  Patient presents with  . Cough   Patient is a 80 y.o. female presenting with cough. The history is provided by the patient. No language interpreter was used.  Cough Cough characteristics:  Productive Sputum characteristics:  Yellow Duration:  3 days Timing:  Constant Chronicity:  New Smoker: no   Associated symptoms: chest pain and shortness of breath   Associated symptoms: no fever      HPI Comments: Sarah Boyd is a 80 y.o. female with a history of asthma who presents to the Emergency Department complaining of a severe cough that began yesterday. She also complains of associated sore chest pain when coughing, slight fever (resolved), DOE, and chronic SOB. Patient has been using Advair. She denies hemoptysis, vomiting, diarrhea, abdominal pain, and pedal edema.  Past Medical History  Diagnosis Date  . Labral tear of shoulder RIGHT SHOULDER  . Asthma   . Hypothyroidism   . History of breast cancer S/P RIGHT MASTECTOMY AND CHEMO 21 YRS AGO (APPROX.  1990)    NO RECURRENCE  . Right shoulder pain   . HOH (hard of hearing) NO AIDS   Past Surgical History  Procedure Laterality Date  . Excision of right chest mass  12-19-1999    BENIGN  . Mastectomy  1990  (APPROX.)    RIGHT BREAST CANCER -- NO RECURRENCE  . Orif bilateral wrist  2009  . Cataract extraction, bilateral    . Shoulder arthroscopy  12/06/2011    Procedure: ARTHROSCOPY SHOULDER;  Surgeon: Magnus Sinning, MD;  Location: [facility];  Service: Orthopedics;  Laterality: Right;  WITH LABRIAL DEBRIDEMENT AND SAD INTERSCALINE BLOCK   Family History  Problem Relation Age of Onset  . Stroke Mother    History  Substance Use Topics  . Smoking status: Never  Smoker   . Smokeless tobacco: Never Used  . Alcohol Use: No   OB History    No data available     Review of Systems  Constitutional: Negative for fever.  Respiratory: Positive for cough and shortness of breath.   Cardiovascular: Positive for chest pain. Negative for leg swelling.  Gastrointestinal: Negative for vomiting, abdominal pain and diarrhea.  All other systems reviewed and are negative.     Allergies  Benadryl and Chocolate  Home Medications   Prior to Admission medications   Medication Sig Start Date End Date Taking? Authorizing Provider  albuterol (PROAIR HFA) 108 (90 BASE) MCG/ACT inhaler Inhale 2 puffs into the lungs every 6 (six) hours as needed for wheezing or shortness of breath. 04/28/13   Mary-Margaret Hassell Done, FNP  alendronate (FOSAMAX) 70 MG tablet TAKE 1 TABLET BY MOUTH ONCE A WEEK WITH A FULL GLASS OF WATER ON AN EMPTY STOMACH 01/22/14   Mary-Margaret Hassell Done, FNP  Fluticasone-Salmeterol (ADVAIR) 100-50 MCG/DOSE AEPB Inhale 1 puff into the lungs 2 (two) times daily. 01/22/14   Mary-Margaret Hassell Done, FNP  levothyroxine (SYNTHROID, LEVOTHROID) 25 MCG tablet TAKE 1 TABLET EVERY MORNING ON AN EMPTY STOMACH 01/22/14   Mary-Margaret Hassell Done, FNP  meclizine (ANTIVERT) 25 MG tablet Take 1 tablet (25 mg total) by mouth 3 (three) times daily as needed. 11/24/13   Philmore Pali, NP  montelukast (SINGULAIR) 10  MG tablet Take 1 tablet (10 mg total) by mouth at bedtime. 01/22/14   Mary-Margaret Hassell Done, FNP  traMADol (ULTRAM) 50 MG tablet Take by mouth every 6 (six) hours as needed.    Historical Provider, MD   BP 140/62 mmHg  Pulse 92  Temp(Src) 99.5 F (37.5 C) (Oral)  Resp 16  Ht 5\' 2"  (1.575 m)  Wt 98 lb (44.453 kg)  BMI 17.92 kg/m2  SpO2 92% Physical Exam  Nursing note and vitals reviewed.    CONSTITUTIONAL: Well developed/well nourished HEAD: Normocephalic/atraumatic EYES: EOMI/PERRL ENMT: Mucous membranes moist NECK: supple no meningeal signs SPINE/BACK:entire  spine nontender CV: S1/S2 noted, no murmurs/rubs/gallops noted LUNGS: Scattered wheeze bilaterally, decreased breath sounds in the bases. ABDOMEN: soft, nontender, no rebound or guarding, bowel sounds noted throughout abdomen GU:no cva tenderness NEURO: Pt is awake/alert/appropriate, moves all extremitiesx4.  No facial droop.   EXTREMITIES: pulses normal/equal, full ROM. No lower extremity edema. SKIN: warm, color normal PSYCH: no abnormalities of mood noted, alert and oriented to situation   ED Course  Procedures   DIAGNOSTIC STUDIES: Oxygen Saturation is 92% on room air, adequate by my interpretation.    COORDINATION OF CARE: 3:37 PM - Discussed treatment plan with pt at bedside which includes breathing treatments and pt agreed to plan.  Patient monitored for several hours She was given nebulizer treatments and felt improved She ambulated and had no symptoms and felt well Her HR improved (was elevated after albuterol) Her lung sounds are improved Suspect viral infection that caused flare of her asthma BP 127/48 mmHg  Pulse 114  Temp(Src) 99.5 F (37.5 C) (Oral)  Resp 15  Ht 5\' 2"  (1.575 m)  Wt 98 lb (44.453 kg)  BMI 17.92 kg/m2  SpO2 94%    Medications  ipratropium-albuterol (DUONEB) 0.5-2.5 (3) MG/3ML nebulizer solution 3 mL (3 mLs Nebulization Given 05/25/14 1545)  albuterol (PROVENTIL) (2.5 MG/3ML) 0.083% nebulizer solution 2.5 mg (2.5 mg Nebulization Given 05/25/14 1545)  predniSONE (DELTASONE) tablet 60 mg (60 mg Oral Given 05/25/14 1617)  albuterol (PROVENTIL,VENTOLIN) solution continuous neb (10 mg/hr Nebulization Given 05/25/14 1653)    Imaging Review Dg Chest 2 View  05/25/2014   CLINICAL DATA:  Cough  EXAM: CHEST  2 VIEW  COMPARISON:  Rib radiographs dated 12/01/2012  FINDINGS: Chronic interstitial markings/emphysematous changes. No focal consolidation. No pleural effusion or pneumothorax.  The heart is normal in size.  Mild degenerative changes of the  visualized thoracolumbar spine.  Status post right mastectomy with right axillary lymph node dissection.  IMPRESSION: No evidence of acute cardiopulmonary disease.   Electronically Signed   By: Julian Hy M.D.   On: 05/25/2014 13:29     EKG Interpretation   Date/Time:  Monday May 25 2014 15:50:18 EST Ventricular Rate:  87 PR Interval:  128 QRS Duration: 89 QT Interval:  385 QTC Calculation: 463 R Axis:   28 Text Interpretation:  Sinus rhythm Nonspecific T abnormalities, lateral  leads artifact noted No significant change since last tracing Confirmed by  Christy Gentles  MD, Elenore Rota ([id number]) on 05/25/2014 3:55:08 PM      MDM   Final diagnoses:  Asthma attack      Nursing notes including past medical history and social history reviewed and considered in documentation xrays/imaging reviewed by myself and considered during evaluation   I personally performed the services described in this documentation, which was scribed in my presence. The recorded information has been reviewed and is accurate.      Estella Husk  Christy Gentles, MD 05/25/14 2040

## 2014-05-25 NOTE — ED Notes (Signed)
RT at the bedside.

## 2014-05-25 NOTE — ED Notes (Signed)
MD at the bedside to reassess 

## 2014-05-26 ENCOUNTER — Ambulatory Visit (INDEPENDENT_AMBULATORY_CARE_PROVIDER_SITE_OTHER): Payer: Commercial Managed Care - HMO | Admitting: Neurology

## 2014-05-26 ENCOUNTER — Encounter: Payer: Self-pay | Admitting: Neurology

## 2014-05-26 VITALS — BP 106/60 | HR 87 | Temp 98.7°F | Ht 60.0 in | Wt 105.0 lb

## 2014-05-26 DIAGNOSIS — R269 Unspecified abnormalities of gait and mobility: Secondary | ICD-10-CM

## 2014-05-26 DIAGNOSIS — R296 Repeated falls: Secondary | ICD-10-CM

## 2014-05-26 DIAGNOSIS — R42 Dizziness and giddiness: Secondary | ICD-10-CM

## 2014-05-26 NOTE — Patient Instructions (Addendum)
I would like for you to limit your meclize to 1/2 pill up to 2 times a day.  Use your walker consistently.  Drink more water.  We will re-try some physical therapy.  Follow up in 4 months.

## 2014-05-26 NOTE — Progress Notes (Signed)
Subjective:    Patient ID: Sarah Boyd is a 80 y.o. female.  HPI     Interim history:   Sarah Boyd is an 80 year old right-handed woman with an underlying medical history of hypothyroidism, breast cancer, hearing loss, arthritis, asthma, and bilateral cataract repairs, who presents for followup consultation of her gait disturbance, balance problems and falls. She is accompanied by her niece, [name], again today. I last saw her on 05/12/2013, at which time she was living by herself and I asked her to get a call alert button. I asked her to use her walker at all times for safety. In the interim, she was seen by our nurse practitioner, [name], on 11/24/2013 at which time she was using a cane more consistently. She had gotten a life alert button. She had not fallen recently.  Today, [name] reports that [name] fell about 3 times in the last 6 months. She did not injure herself. She has not been using her walker inside the house. No LOC or head injury. She went to the ER with cough and mild fever. CXR was negative. She was treated with a nebulizer and was prescribed prednisone. She has not picked it up yet. Meclizine makes her sleepy, if she takes more than 1 per day.    I saw her on 11/05/2012, in which time I asked her to use her walker at all times. I also advised her to look into a life alert button. I felt, she most likely had a multifactorial gait disorder, d/t a combination of intermittent vertigo, arthritis, CNS atherosclerosis, and orthostatic hypotension. In the interim, she fell on 12/01/2012 and broke a rib. She fell against the dresser. Her niece goes to see her at least once a week and helps her with groceries. The patient loves to walk and has been using her walker consistently. She has walked to [employer] and to [employer]. She has been through PT.   I first met her on 08/05/2012, at which time she gave a several year history of gait and balance problems. She fell in 2012 and fractured  both wrists. Years ago she fell off the porch and hurt her right shoulder. She has a history of breast cancer on the right, status post right mastectomy and chemotherapy. She was also complaining of lightheadedness upon standing quickly. She has an underlying medical history of hypothyroidism, osteoporosis, chronic lung disease, past history of breast cancer, status post right mastectomy and chemotherapy. At the time of her first visit with me she displayed mild gait insecurity but no frank ataxia. I felt she had multifactorial gait disturbance due to arthritis, cerebrovascular atherosclerosis, orthostatic hypotension, advanced age, and possible intermittent vertigo. I suggested a brain MRI with and without contrast and recommended physical therapy for gait and balance training. She was advised to use meclizine as needed. She has not been using it and was not sure if it was helpful.   Her brain MRI from 08/15/12 showed mild changes of age-appropriate generalized cerebral atrophy and chronic microvascular ischemia.   She lives in a 2 storey house by herself. If she gets up too quickly, she feels off balance and lightheaded. She was in PT in [place], [place], but I do not have a report available.   She recently had a check up with her PCP.   Her Past Medical History Is Significant For: Past Medical History  Diagnosis Date  . Labral tear of shoulder RIGHT SHOULDER  . Asthma   . Hypothyroidism   .  History of breast cancer S/P RIGHT MASTECTOMY AND CHEMO 21 YRS AGO (APPROX.  1990)    NO RECURRENCE  . Right shoulder pain   . HOH (hard of hearing) NO AIDS    Her Past Surgical History Is Significant For: Past Surgical History  Procedure Laterality Date  . Excision of right chest mass  12-19-1999    BENIGN  . Mastectomy  1990  (APPROX.)    RIGHT BREAST CANCER -- NO RECURRENCE  . Orif bilateral wrist  2009  . Cataract extraction, bilateral    . Shoulder arthroscopy  12/06/2011    Procedure: ARTHROSCOPY  SHOULDER;  Surgeon: Magnus Sinning, MD;  Location: [facility];  Service: Orthopedics;  Laterality: Right;  WITH LABRIAL DEBRIDEMENT AND SAD INTERSCALINE BLOCK    Her Family History Is Significant For: Family History  Problem Relation Age of Onset  . Stroke Mother     Her Social History Is Significant For: History   Social History  . Marital Status: Single    Spouse Name: N/A    Number of Children: 0  . Years of Education: College   Occupational History  . Retired    Social History Main Topics  . Smoking status: Never Smoker   . Smokeless tobacco: Never Used  . Alcohol Use: No  . Drug Use: No  . Sexual Activity: None   Other Topics Concern  . None   Social History Narrative   Patient lives at home alone.   Caffeine Use; none    Her Allergies Are:  Allergies  Allergen Reactions  . Benadryl [Diphenhydramine Hcl] Other (See Comments)    ALTERED MENTAL STATIS  . Chocolate   :   Her Current Medications Are:  Outpatient Encounter Prescriptions as of 05/26/2014  Medication Sig  . albuterol (PROAIR HFA) 108 (90 BASE) MCG/ACT inhaler Inhale 2 puffs into the lungs every 6 (six) hours as needed for wheezing or shortness of breath.  Marland Kitchen alendronate (FOSAMAX) 70 MG tablet TAKE 1 TABLET BY MOUTH ONCE A WEEK WITH A FULL GLASS OF WATER ON AN EMPTY STOMACH  . docusate sodium (COLACE) 100 MG capsule Take 100 mg by mouth daily as needed for mild constipation.  . Fluticasone-Salmeterol (ADVAIR) 100-50 MCG/DOSE AEPB Inhale 1 puff into the lungs 2 (two) times daily.  Marland Kitchen levothyroxine (SYNTHROID, LEVOTHROID) 25 MCG tablet TAKE 1 TABLET EVERY MORNING ON AN EMPTY STOMACH  . meclizine (ANTIVERT) 25 MG tablet Take 1 tablet (25 mg total) by mouth 3 (three) times daily as needed.  . montelukast (SINGULAIR) 10 MG tablet Take 1 tablet (10 mg total) by mouth at bedtime.  . predniSONE (DELTASONE) 50 MG tablet One tablet PO daily for 4 days  . traMADol (ULTRAM) 50 MG tablet Take  50 mg by mouth every 6 (six) hours as needed for moderate pain.   :  Review of Systems:  Out of a complete 14 point review of systems, all are reviewed and negative with the exception of these symptoms as listed below:  Review of Systems  Respiratory: Positive for cough and wheezing.        Asthma flare up , went to ED    Objective:  Neurologic Exam  Physical Exam Physical Examination:   Filed Vitals:   05/26/14 1430  BP: 106/60  Pulse: 87  Temp:     General Examination: The patient is a very pleasant 80 y.o. female in no acute distress. She appears frail. She denies orthostatic symptoms and does not  have any vertigo upon change in position.  HEENT: Normocephalic, atraumatic, pupils are equal, round and reactive to light and accommodation. Hearing is mildly impaired bilaterally. She has a hearing aid on the right. Neck is supple with full range of motion. She has no symptoms of vertigo upon sudden changes in head position. Extraocular tracking is good without nystagmus noted. Speech is clear. Oropharynx examination reveals mild mouth dryness otherwise no significant findings. Tongue protrudes centrally and palate elevates symmetrically. Neck auscultation reveals no carotid bruits.  Chest is clear to auscultation without wheezing or rhonchi noted.  Heart sounds are normal without murmurs, rubs or gallops noted. Abdomen is soft, nontender with normal bowel sounds noted. She has no pitting edema in the distal lower extremities. Skin is warm and dry. I do not appreciate any trophic changes and no joint deformities are noted and no joint swelling. She has mild arthritic changes in her hands and some stiffness and decrease in ROM in her R shoulder.  Neurologically: Mental status: The patient is awake, alert and oriented in all 4 spheres. Her memory, attention, language and knowledge are age appropriate. Cranial nerves are as described above. Motor exam reveals normal bulk and strength for age.  She perhaps has a slight degree of hip flexor weakness bilaterally, unchanged. Reflexes are 1+ throughout including her ankles. Cerebellar testing shows no dysmetria or intention tremor on finger to nose testing and no truncal ataxia. Sensory exam is intact to light touch. Upon standing she has no significant lightheadedness. Romberg testing shows minimal swaying but no corrective steps. She walks fairly well with her walker, maneuvering it well, but has a tendency to turn too fast.            Assessment and Plan:   In summary, Ms. Mcqueen is an 81 year old lady with a several year history of gait and balance problems. Her exam appears stable. She most likely has a multi-factorial gait disturbance d/t a combination of intermittent vertigo, arthritis, CNS atherosclerosis, orthostatic hypotension, dehydration, advancing age and overall frailty. She has been using her walker for the most part, but not consistently and has fallen recently, thankfully without injuries. I advised her to use her walker at all times and to try to drink more water. Meclizine she felt has helped, but it makes her sleepy. I've advised her to take half a pill up to twice daily as needed. I would like to re-refer her to PT for gait and balance training and she would like to go to a place locally in [place], [place] for convenience. She has a life alert button and her niece sees her daily. She is advised to keep well hydrated and change positions slowly and in the mornings when she gets out of bed she is advised to stand up slowly. She and her niece demonstrated understanding and voiced agreement. I will see her back in 4 months from now, sooner if the need arises. They are advised to call with any interim questions, concerns, problems or updates.

## 2014-05-27 ENCOUNTER — Telehealth: Payer: Self-pay | Admitting: Neurology

## 2014-05-27 NOTE — Telephone Encounter (Signed)
LMVM for Niece to return my call with the physical Therapy office that patient used before. Dr. Rexene Alberts states that she was going to call back with the information.

## 2014-05-28 NOTE — Telephone Encounter (Signed)
Called and LMVM thanked Ms. Scales for the information and let her know I have faxed the information.

## 2014-05-28 NOTE — Telephone Encounter (Signed)
Stp, she ended up going to the ER, feeling better now still a little congested. Advised pt to CB if she needed anything from [place]. Pt voiced understanding. Will close encounter.

## 2014-06-12 DIAGNOSIS — R2689 Other abnormalities of gait and mobility: Secondary | ICD-10-CM | POA: Diagnosis not present

## 2014-06-12 DIAGNOSIS — R296 Repeated falls: Secondary | ICD-10-CM | POA: Diagnosis not present

## 2014-06-12 DIAGNOSIS — R42 Dizziness and giddiness: Secondary | ICD-10-CM | POA: Diagnosis not present

## 2014-06-15 DIAGNOSIS — R2689 Other abnormalities of gait and mobility: Secondary | ICD-10-CM | POA: Diagnosis not present

## 2014-06-15 DIAGNOSIS — R296 Repeated falls: Secondary | ICD-10-CM | POA: Diagnosis not present

## 2014-06-15 DIAGNOSIS — R42 Dizziness and giddiness: Secondary | ICD-10-CM | POA: Diagnosis not present

## 2014-06-16 ENCOUNTER — Ambulatory Visit: Payer: Commercial Managed Care - HMO | Attending: Neurology | Admitting: Physical Therapy

## 2014-06-16 DIAGNOSIS — R269 Unspecified abnormalities of gait and mobility: Secondary | ICD-10-CM | POA: Diagnosis not present

## 2014-06-16 DIAGNOSIS — R42 Dizziness and giddiness: Secondary | ICD-10-CM | POA: Diagnosis not present

## 2014-06-16 DIAGNOSIS — R296 Repeated falls: Secondary | ICD-10-CM | POA: Insufficient documentation

## 2014-06-16 DIAGNOSIS — R2689 Other abnormalities of gait and mobility: Secondary | ICD-10-CM | POA: Diagnosis not present

## 2014-06-17 DIAGNOSIS — R42 Dizziness and giddiness: Secondary | ICD-10-CM | POA: Diagnosis not present

## 2014-06-17 DIAGNOSIS — R296 Repeated falls: Secondary | ICD-10-CM | POA: Diagnosis not present

## 2014-06-17 DIAGNOSIS — R2689 Other abnormalities of gait and mobility: Secondary | ICD-10-CM | POA: Diagnosis not present

## 2014-06-18 DIAGNOSIS — R296 Repeated falls: Secondary | ICD-10-CM | POA: Diagnosis not present

## 2014-06-18 DIAGNOSIS — R42 Dizziness and giddiness: Secondary | ICD-10-CM | POA: Diagnosis not present

## 2014-06-18 DIAGNOSIS — R2689 Other abnormalities of gait and mobility: Secondary | ICD-10-CM | POA: Diagnosis not present

## 2014-06-19 ENCOUNTER — Ambulatory Visit: Payer: Commercial Managed Care - HMO | Admitting: *Deleted

## 2014-06-19 DIAGNOSIS — R2689 Other abnormalities of gait and mobility: Secondary | ICD-10-CM | POA: Diagnosis not present

## 2014-06-19 DIAGNOSIS — R296 Repeated falls: Secondary | ICD-10-CM | POA: Diagnosis not present

## 2014-06-19 DIAGNOSIS — R269 Unspecified abnormalities of gait and mobility: Secondary | ICD-10-CM | POA: Diagnosis not present

## 2014-06-19 DIAGNOSIS — R42 Dizziness and giddiness: Secondary | ICD-10-CM | POA: Diagnosis not present

## 2014-06-22 DIAGNOSIS — R42 Dizziness and giddiness: Secondary | ICD-10-CM | POA: Diagnosis not present

## 2014-06-22 DIAGNOSIS — R296 Repeated falls: Secondary | ICD-10-CM | POA: Diagnosis not present

## 2014-06-22 DIAGNOSIS — R2689 Other abnormalities of gait and mobility: Secondary | ICD-10-CM | POA: Diagnosis not present

## 2014-06-23 ENCOUNTER — Ambulatory Visit: Payer: Commercial Managed Care - HMO | Admitting: Physical Therapy

## 2014-06-23 DIAGNOSIS — R42 Dizziness and giddiness: Secondary | ICD-10-CM | POA: Diagnosis not present

## 2014-06-23 DIAGNOSIS — R2689 Other abnormalities of gait and mobility: Secondary | ICD-10-CM | POA: Diagnosis not present

## 2014-06-23 DIAGNOSIS — R296 Repeated falls: Secondary | ICD-10-CM | POA: Diagnosis not present

## 2014-06-23 DIAGNOSIS — R269 Unspecified abnormalities of gait and mobility: Secondary | ICD-10-CM | POA: Diagnosis not present

## 2014-06-24 DIAGNOSIS — R296 Repeated falls: Secondary | ICD-10-CM | POA: Diagnosis not present

## 2014-06-24 DIAGNOSIS — R42 Dizziness and giddiness: Secondary | ICD-10-CM | POA: Diagnosis not present

## 2014-06-24 DIAGNOSIS — R2689 Other abnormalities of gait and mobility: Secondary | ICD-10-CM | POA: Diagnosis not present

## 2014-06-25 DIAGNOSIS — R42 Dizziness and giddiness: Secondary | ICD-10-CM | POA: Diagnosis not present

## 2014-06-25 DIAGNOSIS — R2689 Other abnormalities of gait and mobility: Secondary | ICD-10-CM | POA: Diagnosis not present

## 2014-06-25 DIAGNOSIS — R296 Repeated falls: Secondary | ICD-10-CM | POA: Diagnosis not present

## 2014-06-26 ENCOUNTER — Ambulatory Visit: Payer: Commercial Managed Care - HMO | Admitting: Physical Therapy

## 2014-06-26 DIAGNOSIS — R296 Repeated falls: Secondary | ICD-10-CM | POA: Diagnosis not present

## 2014-06-26 DIAGNOSIS — R269 Unspecified abnormalities of gait and mobility: Secondary | ICD-10-CM | POA: Diagnosis not present

## 2014-06-26 DIAGNOSIS — R42 Dizziness and giddiness: Secondary | ICD-10-CM | POA: Diagnosis not present

## 2014-06-26 DIAGNOSIS — R2689 Other abnormalities of gait and mobility: Secondary | ICD-10-CM | POA: Diagnosis not present

## 2014-06-29 DIAGNOSIS — R296 Repeated falls: Secondary | ICD-10-CM | POA: Diagnosis not present

## 2014-06-29 DIAGNOSIS — R42 Dizziness and giddiness: Secondary | ICD-10-CM | POA: Diagnosis not present

## 2014-06-29 DIAGNOSIS — R2689 Other abnormalities of gait and mobility: Secondary | ICD-10-CM | POA: Diagnosis not present

## 2014-06-30 DIAGNOSIS — R2689 Other abnormalities of gait and mobility: Secondary | ICD-10-CM | POA: Diagnosis not present

## 2014-06-30 DIAGNOSIS — R42 Dizziness and giddiness: Secondary | ICD-10-CM | POA: Diagnosis not present

## 2014-06-30 DIAGNOSIS — R296 Repeated falls: Secondary | ICD-10-CM | POA: Diagnosis not present

## 2014-07-01 DIAGNOSIS — R2689 Other abnormalities of gait and mobility: Secondary | ICD-10-CM | POA: Diagnosis not present

## 2014-07-01 DIAGNOSIS — R296 Repeated falls: Secondary | ICD-10-CM | POA: Diagnosis not present

## 2014-07-01 DIAGNOSIS — R42 Dizziness and giddiness: Secondary | ICD-10-CM | POA: Diagnosis not present

## 2014-07-02 DIAGNOSIS — R2689 Other abnormalities of gait and mobility: Secondary | ICD-10-CM | POA: Diagnosis not present

## 2014-07-02 DIAGNOSIS — R296 Repeated falls: Secondary | ICD-10-CM | POA: Diagnosis not present

## 2014-07-02 DIAGNOSIS — R42 Dizziness and giddiness: Secondary | ICD-10-CM | POA: Diagnosis not present

## 2014-07-03 ENCOUNTER — Encounter: Payer: Commercial Managed Care - HMO | Admitting: *Deleted

## 2014-07-03 DIAGNOSIS — R42 Dizziness and giddiness: Secondary | ICD-10-CM | POA: Diagnosis not present

## 2014-07-03 DIAGNOSIS — R2689 Other abnormalities of gait and mobility: Secondary | ICD-10-CM | POA: Diagnosis not present

## 2014-07-03 DIAGNOSIS — R296 Repeated falls: Secondary | ICD-10-CM | POA: Diagnosis not present

## 2014-07-06 DIAGNOSIS — R42 Dizziness and giddiness: Secondary | ICD-10-CM | POA: Diagnosis not present

## 2014-07-06 DIAGNOSIS — R2689 Other abnormalities of gait and mobility: Secondary | ICD-10-CM | POA: Diagnosis not present

## 2014-07-06 DIAGNOSIS — R296 Repeated falls: Secondary | ICD-10-CM | POA: Diagnosis not present

## 2014-07-07 DIAGNOSIS — R42 Dizziness and giddiness: Secondary | ICD-10-CM | POA: Diagnosis not present

## 2014-07-07 DIAGNOSIS — R296 Repeated falls: Secondary | ICD-10-CM | POA: Diagnosis not present

## 2014-07-07 DIAGNOSIS — R2689 Other abnormalities of gait and mobility: Secondary | ICD-10-CM | POA: Diagnosis not present

## 2014-07-08 DIAGNOSIS — R2689 Other abnormalities of gait and mobility: Secondary | ICD-10-CM | POA: Diagnosis not present

## 2014-07-08 DIAGNOSIS — R42 Dizziness and giddiness: Secondary | ICD-10-CM | POA: Diagnosis not present

## 2014-07-08 DIAGNOSIS — R296 Repeated falls: Secondary | ICD-10-CM | POA: Diagnosis not present

## 2014-07-09 DIAGNOSIS — R296 Repeated falls: Secondary | ICD-10-CM | POA: Diagnosis not present

## 2014-07-09 DIAGNOSIS — R42 Dizziness and giddiness: Secondary | ICD-10-CM | POA: Diagnosis not present

## 2014-07-09 DIAGNOSIS — R2689 Other abnormalities of gait and mobility: Secondary | ICD-10-CM | POA: Diagnosis not present

## 2014-07-10 ENCOUNTER — Ambulatory Visit: Payer: Commercial Managed Care - HMO | Admitting: *Deleted

## 2014-07-10 DIAGNOSIS — R296 Repeated falls: Secondary | ICD-10-CM | POA: Diagnosis not present

## 2014-07-10 DIAGNOSIS — R42 Dizziness and giddiness: Secondary | ICD-10-CM | POA: Diagnosis not present

## 2014-07-10 DIAGNOSIS — R2689 Other abnormalities of gait and mobility: Secondary | ICD-10-CM | POA: Diagnosis not present

## 2014-07-13 DIAGNOSIS — R2689 Other abnormalities of gait and mobility: Secondary | ICD-10-CM | POA: Diagnosis not present

## 2014-07-13 DIAGNOSIS — R42 Dizziness and giddiness: Secondary | ICD-10-CM | POA: Diagnosis not present

## 2014-07-13 DIAGNOSIS — R296 Repeated falls: Secondary | ICD-10-CM | POA: Diagnosis not present

## 2014-07-14 ENCOUNTER — Ambulatory Visit: Payer: Commercial Managed Care - HMO | Admitting: Physical Therapy

## 2014-07-14 DIAGNOSIS — R42 Dizziness and giddiness: Secondary | ICD-10-CM | POA: Diagnosis not present

## 2014-07-14 DIAGNOSIS — R2689 Other abnormalities of gait and mobility: Secondary | ICD-10-CM | POA: Diagnosis not present

## 2014-07-14 DIAGNOSIS — R296 Repeated falls: Secondary | ICD-10-CM | POA: Diagnosis not present

## 2014-07-15 DIAGNOSIS — R2689 Other abnormalities of gait and mobility: Secondary | ICD-10-CM | POA: Diagnosis not present

## 2014-07-15 DIAGNOSIS — R42 Dizziness and giddiness: Secondary | ICD-10-CM | POA: Diagnosis not present

## 2014-07-15 DIAGNOSIS — R296 Repeated falls: Secondary | ICD-10-CM | POA: Diagnosis not present

## 2014-07-16 DIAGNOSIS — R296 Repeated falls: Secondary | ICD-10-CM | POA: Diagnosis not present

## 2014-07-16 DIAGNOSIS — R42 Dizziness and giddiness: Secondary | ICD-10-CM | POA: Diagnosis not present

## 2014-07-16 DIAGNOSIS — R2689 Other abnormalities of gait and mobility: Secondary | ICD-10-CM | POA: Diagnosis not present

## 2014-07-17 ENCOUNTER — Other Ambulatory Visit: Payer: Self-pay | Admitting: Nurse Practitioner

## 2014-07-17 ENCOUNTER — Ambulatory Visit: Payer: Commercial Managed Care - HMO | Attending: Neurology | Admitting: Physical Therapy

## 2014-07-17 DIAGNOSIS — R296 Repeated falls: Secondary | ICD-10-CM | POA: Insufficient documentation

## 2014-07-17 DIAGNOSIS — R269 Unspecified abnormalities of gait and mobility: Secondary | ICD-10-CM | POA: Diagnosis not present

## 2014-07-17 DIAGNOSIS — R2689 Other abnormalities of gait and mobility: Secondary | ICD-10-CM | POA: Diagnosis not present

## 2014-07-17 DIAGNOSIS — R42 Dizziness and giddiness: Secondary | ICD-10-CM | POA: Diagnosis not present

## 2014-07-19 ENCOUNTER — Other Ambulatory Visit: Payer: Self-pay | Admitting: Nurse Practitioner

## 2014-07-20 DIAGNOSIS — R42 Dizziness and giddiness: Secondary | ICD-10-CM | POA: Diagnosis not present

## 2014-07-20 DIAGNOSIS — R296 Repeated falls: Secondary | ICD-10-CM | POA: Diagnosis not present

## 2014-07-20 DIAGNOSIS — R2689 Other abnormalities of gait and mobility: Secondary | ICD-10-CM | POA: Diagnosis not present

## 2014-07-21 DIAGNOSIS — R42 Dizziness and giddiness: Secondary | ICD-10-CM | POA: Diagnosis not present

## 2014-07-21 DIAGNOSIS — R2689 Other abnormalities of gait and mobility: Secondary | ICD-10-CM | POA: Diagnosis not present

## 2014-07-21 DIAGNOSIS — R296 Repeated falls: Secondary | ICD-10-CM | POA: Diagnosis not present

## 2014-07-22 DIAGNOSIS — R42 Dizziness and giddiness: Secondary | ICD-10-CM | POA: Diagnosis not present

## 2014-07-22 DIAGNOSIS — R296 Repeated falls: Secondary | ICD-10-CM | POA: Diagnosis not present

## 2014-07-22 DIAGNOSIS — R2689 Other abnormalities of gait and mobility: Secondary | ICD-10-CM | POA: Diagnosis not present

## 2014-07-23 DIAGNOSIS — R296 Repeated falls: Secondary | ICD-10-CM | POA: Diagnosis not present

## 2014-07-23 DIAGNOSIS — R2689 Other abnormalities of gait and mobility: Secondary | ICD-10-CM | POA: Diagnosis not present

## 2014-07-23 DIAGNOSIS — R42 Dizziness and giddiness: Secondary | ICD-10-CM | POA: Diagnosis not present

## 2014-07-24 ENCOUNTER — Ambulatory Visit: Payer: Commercial Managed Care - HMO | Admitting: *Deleted

## 2014-07-24 DIAGNOSIS — R2689 Other abnormalities of gait and mobility: Secondary | ICD-10-CM | POA: Diagnosis not present

## 2014-07-24 DIAGNOSIS — R296 Repeated falls: Secondary | ICD-10-CM | POA: Diagnosis not present

## 2014-07-24 DIAGNOSIS — R269 Unspecified abnormalities of gait and mobility: Secondary | ICD-10-CM | POA: Diagnosis not present

## 2014-07-24 DIAGNOSIS — R42 Dizziness and giddiness: Secondary | ICD-10-CM | POA: Diagnosis not present

## 2014-07-27 ENCOUNTER — Ambulatory Visit: Payer: Commercial Managed Care - HMO | Admitting: Nurse Practitioner

## 2014-07-27 DIAGNOSIS — R296 Repeated falls: Secondary | ICD-10-CM | POA: Diagnosis not present

## 2014-07-27 DIAGNOSIS — R42 Dizziness and giddiness: Secondary | ICD-10-CM | POA: Diagnosis not present

## 2014-07-27 DIAGNOSIS — R2689 Other abnormalities of gait and mobility: Secondary | ICD-10-CM | POA: Diagnosis not present

## 2014-07-28 DIAGNOSIS — R42 Dizziness and giddiness: Secondary | ICD-10-CM | POA: Diagnosis not present

## 2014-07-28 DIAGNOSIS — R296 Repeated falls: Secondary | ICD-10-CM | POA: Diagnosis not present

## 2014-07-28 DIAGNOSIS — R2689 Other abnormalities of gait and mobility: Secondary | ICD-10-CM | POA: Diagnosis not present

## 2014-07-29 DIAGNOSIS — R42 Dizziness and giddiness: Secondary | ICD-10-CM | POA: Diagnosis not present

## 2014-07-29 DIAGNOSIS — R2689 Other abnormalities of gait and mobility: Secondary | ICD-10-CM | POA: Diagnosis not present

## 2014-07-29 DIAGNOSIS — R296 Repeated falls: Secondary | ICD-10-CM | POA: Diagnosis not present

## 2014-07-30 DIAGNOSIS — R42 Dizziness and giddiness: Secondary | ICD-10-CM | POA: Diagnosis not present

## 2014-07-30 DIAGNOSIS — R2689 Other abnormalities of gait and mobility: Secondary | ICD-10-CM | POA: Diagnosis not present

## 2014-07-30 DIAGNOSIS — R296 Repeated falls: Secondary | ICD-10-CM | POA: Diagnosis not present

## 2014-07-31 ENCOUNTER — Encounter: Payer: Self-pay | Admitting: Physical Therapy

## 2014-07-31 ENCOUNTER — Encounter: Payer: Self-pay | Admitting: Nurse Practitioner

## 2014-07-31 ENCOUNTER — Ambulatory Visit: Payer: Commercial Managed Care - HMO | Admitting: Physical Therapy

## 2014-07-31 ENCOUNTER — Ambulatory Visit (INDEPENDENT_AMBULATORY_CARE_PROVIDER_SITE_OTHER): Payer: Commercial Managed Care - HMO | Admitting: Nurse Practitioner

## 2014-07-31 VITALS — BP 126/68 | HR 80 | Temp 97.1°F | Ht 60.0 in | Wt 105.0 lb

## 2014-07-31 DIAGNOSIS — J4520 Mild intermittent asthma, uncomplicated: Secondary | ICD-10-CM | POA: Diagnosis not present

## 2014-07-31 DIAGNOSIS — M810 Age-related osteoporosis without current pathological fracture: Secondary | ICD-10-CM | POA: Diagnosis not present

## 2014-07-31 DIAGNOSIS — R42 Dizziness and giddiness: Secondary | ICD-10-CM

## 2014-07-31 DIAGNOSIS — E0789 Other specified disorders of thyroid: Secondary | ICD-10-CM | POA: Diagnosis not present

## 2014-07-31 DIAGNOSIS — E038 Other specified hypothyroidism: Secondary | ICD-10-CM | POA: Diagnosis not present

## 2014-07-31 DIAGNOSIS — R1314 Dysphagia, pharyngoesophageal phase: Secondary | ICD-10-CM

## 2014-07-31 DIAGNOSIS — W19XXXA Unspecified fall, initial encounter: Secondary | ICD-10-CM

## 2014-07-31 DIAGNOSIS — R296 Repeated falls: Secondary | ICD-10-CM | POA: Diagnosis not present

## 2014-07-31 DIAGNOSIS — E034 Atrophy of thyroid (acquired): Secondary | ICD-10-CM

## 2014-07-31 DIAGNOSIS — R2689 Other abnormalities of gait and mobility: Secondary | ICD-10-CM | POA: Diagnosis not present

## 2014-07-31 DIAGNOSIS — S0012XA Contusion of left eyelid and periocular area, initial encounter: Secondary | ICD-10-CM

## 2014-07-31 DIAGNOSIS — R269 Unspecified abnormalities of gait and mobility: Secondary | ICD-10-CM | POA: Diagnosis not present

## 2014-07-31 DIAGNOSIS — Y92009 Unspecified place in unspecified non-institutional (private) residence as the place of occurrence of the external cause: Secondary | ICD-10-CM

## 2014-07-31 DIAGNOSIS — Y92099 Unspecified place in other non-institutional residence as the place of occurrence of the external cause: Secondary | ICD-10-CM

## 2014-07-31 MED ORDER — ALENDRONATE SODIUM 70 MG PO TABS: ORAL_TABLET | ORAL | Status: DC

## 2014-07-31 MED ORDER — FLUTICASONE-SALMETEROL 100-50 MCG/DOSE IN AEPB: 1.0000 | INHALATION_SPRAY | Freq: Two times a day (BID) | RESPIRATORY_TRACT | Status: DC

## 2014-07-31 NOTE — Therapy (Addendum)
[facility] [place], [place], [address] Phone: [phone]   Fax:  [phone]  Physical Therapy Treatment  Patient Details  Name: Sarah Boyd MRN: [id number] Date of Birth: 21-May-1930 Referring Provider:  Chevis Pretty, *  Encounter Date: 07/31/2014      PT End of Session - 07/31/14 1142    Visit Number 8   Number of Visits 12   Date for PT Re-Evaluation 08/15/14   PT Start Time 1025   PT Stop Time 1108   PT Time Calculation (min) 43 min      Past Medical History  Diagnosis Date  . Labral tear of shoulder RIGHT SHOULDER  . Asthma   . Hypothyroidism   . History of breast cancer S/P RIGHT MASTECTOMY AND CHEMO 21 YRS AGO (APPROX.  1990)    NO RECURRENCE  . Right shoulder pain   . HOH (hard of hearing) NO AIDS    Past Surgical History  Procedure Laterality Date  . Excision of right chest mass  12-19-1999    BENIGN  . Mastectomy  1990  (APPROX.)    RIGHT BREAST CANCER -- NO RECURRENCE  . Orif bilateral wrist  2009  . Cataract extraction, bilateral    . Shoulder arthroscopy  12/06/2011    Procedure: ARTHROSCOPY SHOULDER;  Surgeon: Magnus Sinning, MD;  Location: [facility];  Service: Orthopedics;  Laterality: Right;  WITH LABRIAL DEBRIDEMENT AND SAD INTERSCALINE BLOCK    There were no vitals taken for this visit.  Visit Diagnosis:  Gait disorder  Recurrent falls  Dizziness and giddiness      Subjective Assessment - 07/31/14 1040    Symptoms doing pretty good   Currently in Pain? No/denies                    [facility] Adult PT Treatment/Exercise - 07/31/14 0001    Exercises   Exercises Knee/Hip   Knee/Hip Exercises: Aerobic   Stationary Bike Nustep level x 15 minute             Balance Exercises - 07/31/14 1107    Balance Exercises: Standing   Other Standing Exercises on BOSU with intermittent UE support and intermittent min A x 6 minutes followed by  Rockerboard x 5 minutes with one hand for support on parallel on board x 5 minutes f/u inverted BOSU ball in parallel bars x 5 minutes and green XTS resiisted 4 way walking.                PT Long Term Goals - 07/31/14 1035    PT LONG TERM GOAL #1   Title demonstrate and/or verbalize techniques to reduce the risk of re-injury to include on: fall prevention.   Period --  12 visits   Status Achieved  achieved   PT LONG TERM GOAL #2   Title be independent with HEP   Period --  12 visits   Status On-going   PT LONG TERM GOAL #3   Title increase Berg to 44/56   Period --  12 visits   PT LONG TERM GOAL #4   Title demonstrate a negative Romberg test   Period --  12 visits               Plan - 07/31/14 1139    Clinical Impression Statement Patient feels balance i improving.   Pt will benefit from skilled therapeutic intervention in order to improve on the following deficits Abnormal  gait   PT Treatment/Interventions Patient/family education;Therapeutic exercise;Gait training;Balance training;Neuromuscular re-education   PT Next Visit Plan Advance neuro-re-edu   Consulted and Agree with Plan of Care Patient        Problem List Patient Active Problem List   Diagnosis Date Noted  . Asthma, chronic 12/05/2012  . Osteoporosis 12/05/2012  . Hypothyroidism 12/05/2012    Tryson Lumley, [place] , MPT  07/31/2014, 11:43 AM  [facility] [address] [place], [place], [address] Phone: [phone]   Fax:  [phone]

## 2014-07-31 NOTE — Progress Notes (Signed)
   Subjective:    Patient ID: Sarah Boyd, female    DOB: 12/02/1929, 81 y.o.   MRN: [id number]  HPI  Patient in today for follow up of chronic medical problems. SHe is doing well. No changes since last visit other than she is starting to be a little weak and is now walking with a walker. Patient fell last Sunday night and hit her face and has a right black eye. It did not knock her out when she fell. No c/o headache or dizziness. No blurred vision. SHe also c/o dysphagia- has it everytime she starts eating Hypothyroidism Currently on synthroid 23mg daily- no complaints Asthma Takes singulair and advair daily has not need albuterol in quite sometime. Osteoporosis Fosamax weekly- has some back pain that she takes ultram for.  Review of Systems  Constitutional: Negative.   HENT: Negative.   Respiratory: Negative.   Genitourinary: Negative.   Neurological: Negative.   Psychiatric/Behavioral: Negative.   All other systems reviewed and are negative.      Objective:   Physical Exam  Constitutional: She is oriented to person, place, and time. She appears well-developed and well-nourished.  HENT:  Nose: Nose normal.  Mouth/Throat: Oropharynx is clear and moist.  Eyes: EOM are normal.  Neck: Trachea normal, normal range of motion and full passive range of motion without pain. Neck supple. No JVD present. Carotid bruit is not present. No thyromegaly present.  Cardiovascular: Normal rate, regular rhythm, normal heart sounds and intact distal pulses.  Exam reveals no gallop and no friction rub.   No murmur heard. Pulmonary/Chest: Effort normal and breath sounds normal.  Abdominal: Soft. Bowel sounds are normal. She exhibits no distension and no mass. There is no tenderness.  Musculoskeletal: Normal range of motion.  Patient walking with walker- steady and slow  Lymphadenopathy:    She has no cervical adenopathy.  Neurological: She is alert and oriented to person, place, and time. She  has normal reflexes. No cranial nerve deficit.  Skin: Skin is warm and dry.  Psychiatric: She has a normal mood and affect. Her behavior is normal. Judgment and thought content normal.   BP 126/68 mmHg  Pulse 80  Temp(Src) 97.1 F (36.2 C) (Oral)  Ht 5' (1.524 m)  Wt 105 lb (47.628 kg)  BMI 20.51 kg/m2        Assessment & Plan:    1. Asthma, chronic, mild intermittent, uncomplicated - Fluticasone-Salmeterol (ADVAIR) 100-50 MCG/DOSE AEPB; Inhale 1 puff into the lungs 2 (two) times daily.  Dispense: 1 each; Refill: 5  2. Hypothyroidism due to acquired atrophy of thyroid - CMP14+EGFR - NMR, lipoprofile - Thyroid Panel With TSH  3. Osteoporosis Fall prevention- alendronate (FOSAMAX) 70 MG tablet; TAKE 1 TABLET BY MOUTH ONCE A WEEK WITH A FULL GLASS OF WATER ON AN EMPTY STOMACH  Dispense: 4 tablet; Refill: 5  4. Fall at home, initial encounter  5. Black eye, left, initial encounter ICP symptoms discussed with family  6. Dysphagia, pharyngoesophageal phase Chew foods and onlytake small bites - Ambulatory referral to Gastroenterology    Labs pending Health maintenance reviewed Diet and exercise encouraged Continue all meds Follow up  In 6 months   MDalton FNP

## 2014-07-31 NOTE — Patient Instructions (Signed)

## 2014-08-01 LAB — CMP14+EGFR
ALT: 14 IU/L (ref 0–32)
AST: 26 IU/L (ref 0–40)
Albumin/Globulin Ratio: 1.7 (ref 1.1–2.5)
Albumin: 4.4 g/dL (ref 3.5–4.7)
Alkaline Phosphatase: 63 IU/L (ref 39–117)
BUN/Creatinine Ratio: 21 (ref 11–26)
BUN: 15 mg/dL (ref 8–27)
Bilirubin Total: 0.2 mg/dL (ref 0.0–1.2)
CO2: 25 mmol/L (ref 18–29)
Calcium: 9.5 mg/dL (ref 8.7–10.3)
Chloride: 101 mmol/L (ref 97–108)
Creatinine, Ser: 0.73 mg/dL (ref 0.57–1.00)
GFR calc Af Amer: 89 mL/min/{1.73_m2} (ref >59)
GFR, EST NON AFRICAN AMERICAN: 77 mL/min/{1.73_m2} (ref >59)
GLUCOSE: 95 mg/dL (ref 65–99)
Globulin, Total: 2.6 g/dL (ref 1.5–4.5)
Potassium: 4.6 mmol/L (ref 3.5–5.2)
SODIUM: 144 mmol/L (ref 134–144)
Total Protein: 7 g/dL (ref 6.0–8.5)

## 2014-08-01 LAB — NMR, LIPOPROFILE
Cholesterol: 198 mg/dL (ref 100–199)
HDL CHOLESTEROL BY NMR: 90 mg/dL (ref >39)
HDL PARTICLE NUMBER: 37.3 umol/L (ref >=30.5)
LDL Particle Number: 591 nmol/L (ref <1000)
LDL Size: 21.8 nm (ref >20.5)
LDL-C: 44 mg/dL (ref 0–99)
LP-IR SCORE: 47 — AB (ref <=45)
Small LDL Particle Number: <90 nmol/L (ref <=527)
TRIGLYCERIDES BY NMR: 322 mg/dL — AB (ref 0–149)

## 2014-08-01 LAB — THYROID PANEL WITH TSH
Free Thyroxine Index: 1.5 (ref 1.2–4.9)
T3 Uptake Ratio: 25 % (ref 24–39)
T4 TOTAL: 6.1 ug/dL (ref 4.5–12.0)
TSH: 1.73 u[IU]/mL (ref 0.450–4.500)

## 2014-08-02 ENCOUNTER — Emergency Department (HOSPITAL_COMMUNITY): Payer: Commercial Managed Care - HMO

## 2014-08-02 ENCOUNTER — Emergency Department (HOSPITAL_COMMUNITY)
Admission: EM | Admit: 2014-08-02 | Discharge: 2014-08-02 | Disposition: A | Discharge status: Home / Self Care | Payer: Commercial Managed Care - HMO | Attending: Emergency Medicine | Admitting: Emergency Medicine

## 2014-08-02 ENCOUNTER — Encounter (HOSPITAL_COMMUNITY): Payer: Self-pay | Admitting: Emergency Medicine

## 2014-08-02 DIAGNOSIS — Z79899 Other long term (current) drug therapy: Secondary | ICD-10-CM | POA: Diagnosis not present

## 2014-08-02 DIAGNOSIS — Y998 Other external cause status: Secondary | ICD-10-CM | POA: Diagnosis not present

## 2014-08-02 DIAGNOSIS — Z8739 Personal history of other diseases of the musculoskeletal system and connective tissue: Secondary | ICD-10-CM | POA: Insufficient documentation

## 2014-08-02 DIAGNOSIS — S0990XA Unspecified injury of head, initial encounter: Secondary | ICD-10-CM | POA: Diagnosis present

## 2014-08-02 DIAGNOSIS — W19XXXA Unspecified fall, initial encounter: Secondary | ICD-10-CM

## 2014-08-02 DIAGNOSIS — Z853 Personal history of malignant neoplasm of breast: Secondary | ICD-10-CM | POA: Diagnosis not present

## 2014-08-02 DIAGNOSIS — S0101XA Laceration without foreign body of scalp, initial encounter: Secondary | ICD-10-CM | POA: Diagnosis not present

## 2014-08-02 DIAGNOSIS — E039 Hypothyroidism, unspecified: Secondary | ICD-10-CM | POA: Diagnosis not present

## 2014-08-02 DIAGNOSIS — S0181XA Laceration without foreign body of other part of head, initial encounter: Secondary | ICD-10-CM | POA: Diagnosis not present

## 2014-08-02 DIAGNOSIS — S199XXA Unspecified injury of neck, initial encounter: Secondary | ICD-10-CM | POA: Diagnosis not present

## 2014-08-02 DIAGNOSIS — Y9389 Activity, other specified: Secondary | ICD-10-CM | POA: Diagnosis not present

## 2014-08-02 DIAGNOSIS — Z8669 Personal history of other diseases of the nervous system and sense organs: Secondary | ICD-10-CM | POA: Diagnosis not present

## 2014-08-02 DIAGNOSIS — J45909 Unspecified asthma, uncomplicated: Secondary | ICD-10-CM | POA: Diagnosis not present

## 2014-08-02 DIAGNOSIS — Y9289 Other specified places as the place of occurrence of the external cause: Secondary | ICD-10-CM | POA: Insufficient documentation

## 2014-08-02 DIAGNOSIS — W01198A Fall on same level from slipping, tripping and stumbling with subsequent striking against other object, initial encounter: Secondary | ICD-10-CM | POA: Diagnosis not present

## 2014-08-02 NOTE — ED Provider Notes (Signed)
CSN: [id number]     Arrival date & time 08/02/14  1524 History  This chart was scribed for Maudry Diego, MD by Randa Evens, ED Scribe. This patient was seen in room APA11/APA11 and the patient's care was started at 4:08 PM.     Chief Complaint  Patient presents with  . Fall   Patient is a 81 y.o. female presenting with fall. The history is provided by the patient. No language interpreter was used.  Fall This is a new problem. The current episode started 1 to 2 hours ago. The problem occurs rarely. The problem has not changed since onset.Pertinent negatives include no chest pain, no abdominal pain and no headaches. Nothing aggravates the symptoms. Nothing relieves the symptoms. She has tried nothing for the symptoms.   HPI Comments: Sarah Boyd is a 81 y.o. female with PMHx of vertigo  who presents to the Emergency Department complaining of fall onset today PTA. PT presents with a laceration to the right occipital area of her head. Pt states that she was doing her dizziness therapy exercises. Pt states that she fell backwards landing on her buttocks and the head hitting the dresser handle. Pt states that she fell down hitting her head but denies LOC. Pt denies being on blood thinners. Pt doesn't report any other symptoms.   Past Medical History  Diagnosis Date  . Labral tear of shoulder RIGHT SHOULDER  . Asthma   . Hypothyroidism   . History of breast cancer S/P RIGHT MASTECTOMY AND CHEMO 21 YRS AGO (APPROX.  1990)    NO RECURRENCE  . Right shoulder pain   . HOH (hard of hearing) NO AIDS   Past Surgical History  Procedure Laterality Date  . Excision of right chest mass  12-19-1999    BENIGN  . Mastectomy  1990  (APPROX.)    RIGHT BREAST CANCER -- NO RECURRENCE  . Orif bilateral wrist  2009  . Cataract extraction, bilateral    . Shoulder arthroscopy  12/06/2011    Procedure: ARTHROSCOPY SHOULDER;  Surgeon: Magnus Sinning, MD;  Location: [facility];   Service: Orthopedics;  Laterality: Right;  WITH LABRIAL DEBRIDEMENT AND SAD INTERSCALINE BLOCK   Family History  Problem Relation Age of Onset  . Stroke Mother    History  Substance Use Topics  . Smoking status: Never Smoker   . Smokeless tobacco: Never Used  . Alcohol Use: No   OB History    Gravida Para Term Preterm AB TAB SAB Ectopic Multiple Living            0      Review of Systems  Constitutional: Negative for appetite change and fatigue.  HENT: Negative for congestion, ear discharge and sinus pressure.   Eyes: Negative for discharge.  Respiratory: Negative for cough.   Cardiovascular: Negative for chest pain.  Gastrointestinal: Negative for abdominal pain and diarrhea.  Genitourinary: Negative for frequency and hematuria.  Musculoskeletal: Negative for back pain.  Skin: Positive for wound. Negative for rash.  Neurological: Negative for seizures, syncope and headaches.  Psychiatric/Behavioral: Negative for hallucinations.      Allergies  Benadryl and Chocolate  Home Medications   Prior to Admission medications   Medication Sig Start Date End Date Taking? Authorizing Provider  albuterol (PROAIR HFA) 108 (90 BASE) MCG/ACT inhaler Inhale 2 puffs into the lungs every 6 (six) hours as needed for wheezing or shortness of breath. 04/28/13  Yes Mary-Margaret Hassell Done, FNP  alendronate (FOSAMAX) 70  MG tablet TAKE 1 TABLET BY MOUTH ONCE A WEEK WITH A FULL GLASS OF WATER ON AN EMPTY STOMACH 07/31/14  Yes Mary-Margaret Hassell Done, FNP  Calcium Carbonate-Vitamin D (CALCIUM + D PO) Take 1 tablet by mouth daily.   Yes Historical Provider, MD  Fluticasone-Salmeterol (ADVAIR) 100-50 MCG/DOSE AEPB Inhale 1 puff into the lungs 2 (two) times daily. 07/31/14  Yes Mary-Margaret Hassell Done, FNP  levothyroxine (SYNTHROID, LEVOTHROID) 25 MCG tablet TAKE 1 TABLET EVERY MORNING ON AN EMPTY STOMACH NEEDS TO BE SEEN 07/20/14  Yes Mary-Margaret Hassell Done, FNP  meclizine (ANTIVERT) 25 MG tablet Take 1 tablet  (25 mg total) by mouth 3 (three) times daily as needed. Patient taking differently: Take 12.5 mg by mouth 2 (two) times daily.  11/24/13  Yes Philmore Pali, NP  montelukast (SINGULAIR) 10 MG tablet TAKE 1 TABLET (10 MG TOTAL) BY MOUTH AT BEDTIME. 07/20/14  Yes Mary-Margaret Hassell Done, FNP   BP 128/67 mmHg  Pulse 80  Temp(Src) 98.2 F (36.8 C) (Oral)  Resp 18  Ht 5\' 1"  (1.549 m)  Wt 105 lb (47.628 kg)  BMI 19.85 kg/m2  SpO2 95%   Physical Exam  Constitutional: She is oriented to person, place, and time. She appears well-developed.  HENT:  Head: Normocephalic.  Dried blood right occipital area.  3 cm lac to scalp  Eyes: Conjunctivae and EOM are normal. No scleral icterus.  Neck: Neck supple. No thyromegaly present.  Cardiovascular: Normal rate and regular rhythm.  Exam reveals no gallop and no friction rub.   No murmur heard. Pulmonary/Chest: No stridor. She has no wheezes. She has no rales. She exhibits no tenderness.  Abdominal: She exhibits no distension. There is no tenderness. There is no rebound.  Musculoskeletal: Normal range of motion. She exhibits no edema.  Lymphadenopathy:    She has no cervical adenopathy.  Neurological: She is oriented to person, place, and time. She exhibits normal muscle tone. Coordination normal.  Skin: No rash noted. No erythema.  Psychiatric: She has a normal mood and affect. Her behavior is normal.  Nursing note and vitals reviewed.   ED Course  LACERATION REPAIR Date/Time: 08/02/2014 6:22 PM Performed by: Verdella Laidlaw L Authorized by: Milton Ferguson L Comments: 3 cm scalp lac.  Cleaned with betadine,  No numbing.   3 staples used to close lac   (including critical care time) DIAGNOSTIC STUDIES: Oxygen Saturation is 95% on RA, adequate by my interpretation.    COORDINATION OF CARE: 4:12 PM-Discussed treatment plan with pt at bedside and pt agreed to plan.     Labs Review Labs Reviewed - No data to display  Imaging Review Ct Head Wo  Contrast  08/02/2014   CLINICAL DATA:  Initial evaluation of fall today with laceration right occipital area, history of vertigo, denies loss of consciousness, personal history of breast cancer  EXAM: CT HEAD WITHOUT CONTRAST  CT CERVICAL SPINE WITHOUT CONTRAST  TECHNIQUE: Multidetector CT imaging of the head and cervical spine was performed following the standard protocol without intravenous contrast. Multiplanar CT image reconstructions of the cervical spine were also generated.  COMPARISON:  12/25/2007  FINDINGS: CT HEAD FINDINGS  Prominent perivascular space medial right temporal lobe, stable from prior study. Moderate diffuse atrophy. Mild low attenuation in the deep white matter. No evidence of vascular territory infarct or mass. No hydrocephalus. No hemorrhage or extra-axial fluid. Calvarium is intact.  CT CERVICAL SPINE FINDINGS  No soft tissue abnormalities. No prevertebral swelling. No fracture. Normal alignment. Significant degenerative disc disease  at C4-5, C5-6, C6-7, and C7-T1. Lung apices clear.  IMPRESSION: No acute traumatic injury to the cervical spine. No acute intracranial abnormalities.   Electronically Signed   By: Skipper Cliche M.D.   On: 08/02/2014 17:51   Ct Cervical Spine Wo Contrast  08/02/2014   CLINICAL DATA:  Initial evaluation of fall today with laceration right occipital area, history of vertigo, denies loss of consciousness, personal history of breast cancer  EXAM: CT HEAD WITHOUT CONTRAST  CT CERVICAL SPINE WITHOUT CONTRAST  TECHNIQUE: Multidetector CT imaging of the head and cervical spine was performed following the standard protocol without intravenous contrast. Multiplanar CT image reconstructions of the cervical spine were also generated.  COMPARISON:  12/25/2007  FINDINGS: CT HEAD FINDINGS  Prominent perivascular space medial right temporal lobe, stable from prior study. Moderate diffuse atrophy. Mild low attenuation in the deep white matter. No evidence of vascular  territory infarct or mass. No hydrocephalus. No hemorrhage or extra-axial fluid. Calvarium is intact.  CT CERVICAL SPINE FINDINGS  No soft tissue abnormalities. No prevertebral swelling. No fracture. Normal alignment. Significant degenerative disc disease at C4-5, C5-6, C6-7, and C7-T1. Lung apices clear.  IMPRESSION: No acute traumatic injury to the cervical spine. No acute intracranial abnormalities.   Electronically Signed   By: Skipper Cliche M.D.   On: 08/02/2014 17:51     EKG Interpretation None      MDM   Final diagnoses:  None       Fall,   Scalp lac,   The chart was scribed for me under my direct supervision.  I personally performed the history, physical, and medical decision making and all procedures in the evaluation of this patient.Maudry Diego, MD 08/02/14 [phone]

## 2014-08-02 NOTE — Discharge Instructions (Signed)
[facility] out in 7-10 days

## 2014-08-02 NOTE — ED Notes (Signed)
Patient has laceration to back of head. Bleeding controlled. Per patient doing physical therapy exercises at home by herself when she fell backwards with buttock landing on floor and hitting head on dresser handle. Patient denies any LOC, has hx of vertigo. Per patient no pain other than laceration. Denies taking any king of blood thinners.

## 2014-08-03 DIAGNOSIS — R296 Repeated falls: Secondary | ICD-10-CM | POA: Diagnosis not present

## 2014-08-03 DIAGNOSIS — R42 Dizziness and giddiness: Secondary | ICD-10-CM | POA: Diagnosis not present

## 2014-08-03 DIAGNOSIS — R2689 Other abnormalities of gait and mobility: Secondary | ICD-10-CM | POA: Diagnosis not present

## 2014-08-04 DIAGNOSIS — R2689 Other abnormalities of gait and mobility: Secondary | ICD-10-CM | POA: Diagnosis not present

## 2014-08-04 DIAGNOSIS — R296 Repeated falls: Secondary | ICD-10-CM | POA: Diagnosis not present

## 2014-08-04 DIAGNOSIS — R42 Dizziness and giddiness: Secondary | ICD-10-CM | POA: Diagnosis not present

## 2014-08-05 ENCOUNTER — Other Ambulatory Visit: Payer: Self-pay | Admitting: Gastroenterology

## 2014-08-05 DIAGNOSIS — R2689 Other abnormalities of gait and mobility: Secondary | ICD-10-CM | POA: Diagnosis not present

## 2014-08-05 DIAGNOSIS — R1314 Dysphagia, pharyngoesophageal phase: Secondary | ICD-10-CM | POA: Diagnosis not present

## 2014-08-05 DIAGNOSIS — R1310 Dysphagia, unspecified: Secondary | ICD-10-CM

## 2014-08-05 DIAGNOSIS — R42 Dizziness and giddiness: Secondary | ICD-10-CM | POA: Diagnosis not present

## 2014-08-05 DIAGNOSIS — R296 Repeated falls: Secondary | ICD-10-CM | POA: Diagnosis not present

## 2014-08-06 ENCOUNTER — Ambulatory Visit: Payer: Commercial Managed Care - HMO | Admitting: *Deleted

## 2014-08-06 ENCOUNTER — Encounter: Payer: Self-pay | Admitting: *Deleted

## 2014-08-06 DIAGNOSIS — R296 Repeated falls: Secondary | ICD-10-CM

## 2014-08-06 DIAGNOSIS — R42 Dizziness and giddiness: Secondary | ICD-10-CM

## 2014-08-06 DIAGNOSIS — R269 Unspecified abnormalities of gait and mobility: Secondary | ICD-10-CM | POA: Diagnosis not present

## 2014-08-06 DIAGNOSIS — R2689 Other abnormalities of gait and mobility: Secondary | ICD-10-CM | POA: Diagnosis not present

## 2014-08-06 NOTE — Therapy (Signed)
[facility] [place], [place], [address] Phone: [phone]   Fax:  [phone]  Physical Therapy Treatment  Patient Details  Name: Sarah Boyd MRN: [id number] Date of Birth: 09/11/1929 Referring Provider:  Chevis Pretty, *  Encounter Date: 08/06/2014      PT End of Session - 08/06/14 1404    Visit Number 9   Number of Visits 12   Date for PT Re-Evaluation 08/15/14   PT Start Time 1301   PT Stop Time 1346   PT Time Calculation (min) 45 min   Activity Tolerance Patient tolerated treatment well      Past Medical History  Diagnosis Date  . Labral tear of shoulder RIGHT SHOULDER  . Asthma   . Hypothyroidism   . History of breast cancer S/P RIGHT MASTECTOMY AND CHEMO 21 YRS AGO (APPROX.  1990)    NO RECURRENCE  . Right shoulder pain   . HOH (hard of hearing) NO AIDS    Past Surgical History  Procedure Laterality Date  . Excision of right chest mass  12-19-1999    BENIGN  . Mastectomy  1990  (APPROX.)    RIGHT BREAST CANCER -- NO RECURRENCE  . Orif bilateral wrist  2009  . Cataract extraction, bilateral    . Shoulder arthroscopy  12/06/2011    Procedure: ARTHROSCOPY SHOULDER;  Surgeon: Magnus Sinning, MD;  Location: [facility];  Service: Orthopedics;  Laterality: Right;  WITH LABRIAL DEBRIDEMENT AND SAD INTERSCALINE BLOCK    There were no vitals taken for this visit.  Visit Diagnosis:  Gait disorder  Recurrent falls  Dizziness and giddiness      Subjective Assessment - 08/06/14 1309    Symptoms Pt.states that she fell at home, but doing ok   Limitations Walking                    [facility] Adult PT Treatment/Exercise - 08/06/14 0001    Balance   Balance Assessed Yes  Inverted BOSU ball, and one step holds,all with SBA or CGA   Knee/Hip Exercises: Aerobic   Stationary Bike Nustep level 3-4  x 15 minute   Knee/Hip Exercises: Standing   Heel Raises 2 sets;10  reps   Hip ADduction Strengthening;2 sets;20 reps  both hips, and marching in place 2x 20 both LEs   Forward Step Up Step Height: 6"  toe touches only for balance with CGA   Rocker Board 3 minutes  with CGA                     PT Long Term Goals - 07/31/14 1035    PT LONG TERM GOAL #1   Title demonstrate and/or verbalize techniques to reduce the risk of re-injury to include on: fall prevention.   Period --  12 visits   Status Achieved  achieved   PT LONG TERM GOAL #2   Title be independent with HEP   Period --  12 visits   Status On-going   PT LONG TERM GOAL #3   Title increase Berg to 44/56   Period --  12 visits   PT LONG TERM GOAL #4   Title demonstrate a negative Romberg test   Period --  12 visits               Plan - 08/06/14 1406    Clinical Impression Statement Pt did fairly well and was able to perform all activities, but  was tired. Goals are ongoing. Romberg is still (+)   PT Next Visit Plan Cont with balance and therex activities. berg test         Problem List Patient Active Problem List   Diagnosis Date Noted  . Asthma, chronic 12/05/2012  . Osteoporosis 12/05/2012  . Hypothyroidism 12/05/2012    RAMSEUR,CHRISPTA 08/06/2014, 2:16 PM  [facility] [address] [place], [place], [address] Phone: [phone]   Fax:  [phone]

## 2014-08-07 ENCOUNTER — Ambulatory Visit
Admission: RE | Admit: 2014-08-07 | Discharge: 2014-08-07 | Disposition: A | Discharge status: Home / Self Care | Payer: Commercial Managed Care - HMO | Source: Ambulatory Visit | Attending: Gastroenterology | Admitting: Gastroenterology

## 2014-08-07 DIAGNOSIS — R42 Dizziness and giddiness: Secondary | ICD-10-CM | POA: Diagnosis not present

## 2014-08-07 DIAGNOSIS — R296 Repeated falls: Secondary | ICD-10-CM | POA: Diagnosis not present

## 2014-08-07 DIAGNOSIS — K229 Disease of esophagus, unspecified: Secondary | ICD-10-CM | POA: Diagnosis not present

## 2014-08-07 DIAGNOSIS — R1310 Dysphagia, unspecified: Secondary | ICD-10-CM

## 2014-08-07 DIAGNOSIS — R2689 Other abnormalities of gait and mobility: Secondary | ICD-10-CM | POA: Diagnosis not present

## 2014-08-10 ENCOUNTER — Other Ambulatory Visit: Payer: Self-pay | Admitting: Nurse Practitioner

## 2014-08-10 ENCOUNTER — Other Ambulatory Visit (HOSPITAL_COMMUNITY): Payer: Self-pay | Admitting: Gastroenterology

## 2014-08-10 DIAGNOSIS — R1310 Dysphagia, unspecified: Secondary | ICD-10-CM

## 2014-08-10 DIAGNOSIS — R2689 Other abnormalities of gait and mobility: Secondary | ICD-10-CM | POA: Diagnosis not present

## 2014-08-10 DIAGNOSIS — R42 Dizziness and giddiness: Secondary | ICD-10-CM | POA: Diagnosis not present

## 2014-08-10 DIAGNOSIS — R296 Repeated falls: Secondary | ICD-10-CM | POA: Diagnosis not present

## 2014-08-11 ENCOUNTER — Ambulatory Visit (INDEPENDENT_AMBULATORY_CARE_PROVIDER_SITE_OTHER): Payer: Commercial Managed Care - HMO | Admitting: Family

## 2014-08-11 ENCOUNTER — Encounter: Payer: Self-pay | Admitting: Family

## 2014-08-11 VITALS — BP 121/67 | HR 88 | Temp 97.5°F | Ht 61.0 in | Wt 103.2 lb

## 2014-08-11 DIAGNOSIS — R2689 Other abnormalities of gait and mobility: Secondary | ICD-10-CM | POA: Diagnosis not present

## 2014-08-11 DIAGNOSIS — Z4802 Encounter for removal of sutures: Secondary | ICD-10-CM | POA: Diagnosis not present

## 2014-08-11 DIAGNOSIS — R42 Dizziness and giddiness: Secondary | ICD-10-CM | POA: Diagnosis not present

## 2014-08-11 DIAGNOSIS — R296 Repeated falls: Secondary | ICD-10-CM | POA: Diagnosis not present

## 2014-08-11 DIAGNOSIS — S0191XD Laceration without foreign body of unspecified part of head, subsequent encounter: Secondary | ICD-10-CM | POA: Diagnosis not present

## 2014-08-11 NOTE — Progress Notes (Signed)
   Subjective:    Patient ID: Sarah Boyd, female    DOB: Oct 13, 1929, 81 y.o.   MRN: [id number]  HPI Pt presents to the office to have three staples removed from her right side of her head. Pt fell on 08/02/14 and went to the ED and had three staples placed. Pt denies any headache, blurred vision or complications.    Review of Systems  Constitutional: Negative.   HENT: Negative.   Eyes: Negative.   Respiratory: Negative.  Negative for shortness of breath.   Cardiovascular: Negative.  Negative for palpitations.  Gastrointestinal: Negative.   Endocrine: Negative.   Genitourinary: Negative.   Musculoskeletal: Negative.   Neurological: Negative.  Negative for headaches.  Hematological: Negative.   Psychiatric/Behavioral: Negative.   All other systems reviewed and are negative.      Objective:   Physical Exam  Constitutional: She is oriented to person, place, and time. She appears well-developed and well-nourished. No distress.  HENT:  Head: Normocephalic and atraumatic.  Right Ear: External ear normal.  Left Ear: External ear normal.  Nose: Nose normal.  Mouth/Throat: Oropharynx is clear and moist.  Eyes: Pupils are equal, round, and reactive to light.  Neck: Normal range of motion. Neck supple. No thyromegaly present.  Cardiovascular: Normal rate, regular rhythm, normal heart sounds and intact distal pulses.   No murmur heard. Pulmonary/Chest: Effort normal and breath sounds normal. No respiratory distress. She has no wheezes.  Abdominal: Soft. Bowel sounds are normal. She exhibits no distension. There is no tenderness.  Musculoskeletal: Normal range of motion. She exhibits no edema or tenderness.  Neurological: She is alert and oriented to person, place, and time. She has normal reflexes. No cranial nerve deficit.  Skin: Skin is warm and dry.  Closed laceration on right head with a slight dried blood, no redness or swelling present  Psychiatric: She has a normal mood and  affect. Her behavior is normal. Judgment and thought content normal.  Vitals reviewed.   Blood pressure 121/67, pulse 88, temperature 97.5 F (36.4 C), temperature source Oral, height 5\' 1"  (1.549 m), weight 103 lb 3.2 oz (46.811 kg).  Three Staples removed with no complcations      Assessment & Plan:  1. Encounter for staple removal -Keep clean and dry -S/S of infection discussed -Fall precaution discussed -RTO prn   Evelina Dun, FNP

## 2014-08-11 NOTE — Patient Instructions (Signed)
Staple Removal, Care After °The staples that were used to close your skin have been removed. The care described here will need to continue until the wound is completely healed and your health care provider confirms that wound care can be stopped. °HOME CARE INSTRUCTIONS  °· Keep the wound site dry and clean. Do not soak it in water. °· If skin adhesive strips were applied after the staples were removed, they will begin to peel off in a few days. Allow them to remain in place until they fall off on their own. °· If you still have a bandage (dressing), change it at least once a day or as directed by your health care provider. If the dressing sticks, pour warm, sterile water over it until it loosens and can be removed without pulling apart the wound edges. Pat dry with a clean towel. °· Apply cream or ointment that stops the growth of bacteria (antibacterial cream or ointment) only if your health care provider has directed you to do so. Place a nonstick bandage over the wound to prevent the dressing from sticking. °· Cover the nonstick bandage with a new dressing as directed by your health care provider. °· If the bandage becomes wet, dirty, or develops a bad smell, change it as soon as possible. °· New scars become sunburned easily. Use sunscreens with a sun protection factor (SPF) of at least 15 when out in the sun. Reapply the SPF every 2 hours. °· Only take medicines as directed by your health care provider. °SEEK IMMEDIATE MEDICAL CARE IF:  °· You have redness, swelling, or increasing pain in the wound. °· You have pus coming from the wound. °· You have a fever. °· You notice a bad smell coming from the wound or dressing. °· Your wound edges open up after staples have been removed. °MAKE SURE YOU:  °· Understand these instructions. °· Will watch your condition. °· Will get help right away if you are not doing well or get worse. °Document Released: 05/11/2008 Document Revised: 06/03/2013 Document Reviewed:  05/11/2008 °ExitCare® Patient Information ©2015 [employer]. This information is not intended to replace advice given to you by your health care provider. Make sure you discuss any questions you have with your health care provider. ° °

## 2014-08-12 DIAGNOSIS — R2689 Other abnormalities of gait and mobility: Secondary | ICD-10-CM | POA: Diagnosis not present

## 2014-08-12 DIAGNOSIS — R296 Repeated falls: Secondary | ICD-10-CM | POA: Diagnosis not present

## 2014-08-12 DIAGNOSIS — R42 Dizziness and giddiness: Secondary | ICD-10-CM | POA: Diagnosis not present

## 2014-08-13 ENCOUNTER — Ambulatory Visit: Payer: Commercial Managed Care - HMO | Attending: Neurology | Admitting: Physical Therapy

## 2014-08-13 DIAGNOSIS — R42 Dizziness and giddiness: Secondary | ICD-10-CM | POA: Diagnosis not present

## 2014-08-13 DIAGNOSIS — R269 Unspecified abnormalities of gait and mobility: Secondary | ICD-10-CM

## 2014-08-13 DIAGNOSIS — R296 Repeated falls: Secondary | ICD-10-CM | POA: Diagnosis not present

## 2014-08-13 DIAGNOSIS — R2689 Other abnormalities of gait and mobility: Secondary | ICD-10-CM | POA: Diagnosis not present

## 2014-08-13 NOTE — Therapy (Signed)
[facility] [place], [place], [place], [address] Phone: [phone]   Fax:  [phone]  Physical Therapy Treatment  Patient Details  Name: Sarah Boyd MRN: [id number] Date of Birth: 06-19-1929 Referring Provider:  Chevis Pretty, *  Encounter Date: 2014-08-20      PT End of Session - 08-20-14 1650    Visit Number 10   Number of Visits 12   PT Start Time 0446   PT Stop Time 0527   PT Time Calculation (min) 41 min      Past Medical History  Diagnosis Date  . Labral tear of shoulder RIGHT SHOULDER  . Asthma   . Hypothyroidism   . History of breast cancer S/P RIGHT MASTECTOMY AND CHEMO 21 YRS AGO (APPROX.  1990)    NO RECURRENCE  . Right shoulder pain   . HOH (hard of hearing) NO AIDS    Past Surgical History  Procedure Laterality Date  . Excision of right chest mass  12-19-1999    BENIGN  . Mastectomy  1990  (APPROX.)    RIGHT BREAST CANCER -- NO RECURRENCE  . Orif bilateral wrist  2009  . Cataract extraction, bilateral    . Shoulder arthroscopy  12/06/2011    Procedure: ARTHROSCOPY SHOULDER;  Surgeon: Magnus Sinning, MD;  Location: [facility];  Service: Orthopedics;  Laterality: Right;  WITH LABRIAL DEBRIDEMENT AND SAD INTERSCALINE BLOCK    There were no vitals taken for this visit.  Visit Diagnosis:  Gait disorder      Subjective Assessment - 20-Aug-2014 1650    Symptoms Overall doing better.   Limitations Walking   Pain Score 0-No pain                    [facility] Adult PT Treatment/Exercise - 20-Aug-2014 1653    Knee/Hip Exercises: Aerobic   Stationary Bike Nustep level 4  x 15 minute   Knee/Hip Exercises: Standing   Rocker Board --  5 minutes in parallel bars.             Balance Exercises - 08-20-2014 1709    Balance Exercises: Standing   Other Standing Exercises Inverted BOSU 5 minutes      Green XTS assisted walking x 8 minutes.           PT Long  Term Goals - 07/31/14 1035    PT LONG TERM GOAL #1   Title demonstrate and/or verbalize techniques to reduce the risk of re-injury to include on: fall prevention.   Period --  12 visits   Status Achieved  achieved   PT LONG TERM GOAL #2   Title be independent with HEP   Period --  12 visits   Status On-going   PT LONG TERM GOAL #3   Title increase Berg to 44/56   Period --  12 visits   PT LONG TERM GOAL #4   Title demonstrate a negative Romberg test   Period --  12 visits                 G-Codes - 20-Aug-2014 1648    Functional Assessment Tool Used Clinical judgement.   Functional Limitation Mobility: Walking and moving around   Mobility: Walking and Moving Around Current Status [phone]) At least 20 percent but less than 40 percent impaired, limited or restricted   Mobility: Walking and Moving Around Goal Status (T0160) At least 1 percent but less than 20 percent impaired, limited or  restricted      Problem List Patient Active Problem List   Diagnosis Date Noted  . Asthma, chronic 12/05/2012  . Osteoporosis 12/05/2012  . Hypothyroidism 12/05/2012    Dania Marsan, [place] MPT 08/13/2014, 5:34 PM  [facility] [address] [place], [place], [address] Phone: [phone]   Fax:  [phone]

## 2014-08-14 ENCOUNTER — Ambulatory Visit (HOSPITAL_COMMUNITY)
Admission: RE | Admit: 2014-08-14 | Discharge: 2014-08-14 | Disposition: A | Discharge status: Home / Self Care | Payer: Commercial Managed Care - HMO | Source: Ambulatory Visit | Attending: Gastroenterology | Admitting: Gastroenterology

## 2014-08-14 ENCOUNTER — Ambulatory Visit (HOSPITAL_COMMUNITY): Payer: Self-pay

## 2014-08-14 DIAGNOSIS — Z853 Personal history of malignant neoplasm of breast: Secondary | ICD-10-CM | POA: Diagnosis not present

## 2014-08-14 DIAGNOSIS — E039 Hypothyroidism, unspecified: Secondary | ICD-10-CM | POA: Insufficient documentation

## 2014-08-14 DIAGNOSIS — J45909 Unspecified asthma, uncomplicated: Secondary | ICD-10-CM | POA: Insufficient documentation

## 2014-08-14 DIAGNOSIS — R296 Repeated falls: Secondary | ICD-10-CM | POA: Diagnosis not present

## 2014-08-14 DIAGNOSIS — R1312 Dysphagia, oropharyngeal phase: Secondary | ICD-10-CM | POA: Insufficient documentation

## 2014-08-14 DIAGNOSIS — R1310 Dysphagia, unspecified: Secondary | ICD-10-CM | POA: Diagnosis not present

## 2014-08-14 DIAGNOSIS — Z9011 Acquired absence of right breast and nipple: Secondary | ICD-10-CM | POA: Insufficient documentation

## 2014-08-14 DIAGNOSIS — R42 Dizziness and giddiness: Secondary | ICD-10-CM | POA: Diagnosis not present

## 2014-08-14 DIAGNOSIS — R2689 Other abnormalities of gait and mobility: Secondary | ICD-10-CM | POA: Diagnosis not present

## 2014-08-14 NOTE — Procedures (Addendum)
Objective Swallowing Evaluation: Modified Barium Swallowing Study  Patient Details  Name: Sarah Boyd MRN: [id number] Date of Birth: July 18, 1929  Today's Date: 08/14/2014 Time: SLP Start Time (ACUTE ONLY): 1309-SLP Stop Time (ACUTE ONLY): 1345 SLP Time Calculation (min) (ACUTE ONLY): 36 min  Past Medical History:  Past Medical History  Diagnosis Date  . Labral tear of shoulder RIGHT SHOULDER  . Asthma   . Hypothyroidism   . History of breast cancer S/P RIGHT MASTECTOMY AND CHEMO 21 YRS AGO (APPROX.  1990)    NO RECURRENCE  . Right shoulder pain   . HOH (hard of hearing) NO AIDS   Past Surgical History:  Past Surgical History  Procedure Laterality Date  . Excision of right chest mass  12-19-1999    BENIGN  . Mastectomy  1990  (APPROX.)    RIGHT BREAST CANCER -- NO RECURRENCE  . Orif bilateral wrist  2009  . Cataract extraction, bilateral    . Shoulder arthroscopy  12/06/2011    Procedure: ARTHROSCOPY SHOULDER;  Surgeon: Magnus Sinning, MD;  Location: [facility];  Service: Orthopedics;  Laterality: Right;  WITH LABRIAL DEBRIDEMENT AND SAD INTERSCALINE BLOCK   HPI:  HPI: 81 yo female referred by Dr Amedeo Plenty for MBS due to pt demonstrating laryngeal penetration on recent barium swallow and pt report of choking/coughing during intake.  PMH + for arthritis, sensation of food sticking in throat, coughing during intake.  She reports this started approximately 2-3 months ago.  Denies weight loss, requiring heimlich, nor pnas.  Neice [name] present and reports pt used to have hiccups but now more coughing across consistencies.    No Data Recorded  Assessment / Plan / Recommendation CHL IP CLINICAL IMPRESSIONS 08/14/2014  Dysphagia Diagnosis Mild oral phase dysphagia;Mild pharyngeal phase dysphagia  Clinical impression Mild oropharyngeal dysphagia noted with + aspiration of thin producing cough when pt took barium tablet and it lodged at vallecular space.  Barium tablet  initially stuck to oral tongue base, then with furhter swallow of thin - was lodged in valleculae.  SLP provided pt with bolus of pudding to swallow to clear barium tablet.  This caused distress to pt and she/niece report this occurs with meals.  Pt with minimal laryngeal penetration of thin when consuming without solids - to vocal folds x1 clearing with reflexive throat clearing and to upper larynx with other trials.  Oral swallow c/b by piecemealing and decreased oral propulsion.  Pharyngeal swallow functional for solids and liquids alone but not combined as it is strong. Using live video, educated pt/niece to findings and recommendations.    Pt complained of xerostomia as well, slp provided information to compensate.       CHL IP TREATMENT RECOMMENDATION 08/14/2014  Treatment Plan Recommendations No treatment recommended at this time     CHL IP DIET RECOMMENDATION 08/14/2014  Diet Recommendations Dysphagia 3 (Mechanical Soft);Thin liquid  Liquid Administration via Cup  Medication Administration Whole meds with puree  Compensations Slow rate;Small sips/bites  Postural Changes and/or Swallow Maneuvers Seated upright 90 degrees;Upright 30-60 min after meal, start meals with liquids, follow solids with liquids, allow time for extra swallows     CHL IP OTHER RECOMMENDATIONS 08/14/2014  Recommended Consults (None)  Oral Care Recommendations Oral care BID  Other Recommendations (None)         CHL IP REASON FOR REFERRAL 08/14/2014  Reason for Referral Objectively evaluate swallowing function     CHL IP ORAL PHASE 08/14/2014  Oral Phase Impaired  Oral - Nectar Cup WFL;Reduced posterior propulsion  Oral - Thin Cup WFL;Reduced posterior propulsion  Oral - Thin Straw WFL;Reduced posterior propulsion  Oral - Puree Piecemeal swallowing;Delayed oral transit;Weak lingual manipulation;Reduced posterior propulsion;Lingual/palatal residue     Oral - Regular Piecemeal swallowing;Delayed oral transit;Weak lingual  manipulation;Reduced posterior propulsion;Lingual/palatal residue     Oral - Pill Other (Comment);Weak lingual manipulation;Reduced posterior propulsion;Lingual/palatal residue         CHL IP PHARYNGEAL PHASE 08/14/2014  Pharyngeal Phase Impaired  Pharyngeal - Thin Cup Penetration/Aspiration after swallow  Penetration/Aspiration details (thin cup) Material enters airway, passes BELOW cords and not ejected out despite cough attempt by patient  Pharyngeal - Thin Straw WFL;Penetration/Aspiration during swallow  Penetration/Aspiration details (thin straw) Material enters airway, CONTACTS cords then ejected out  Pharyngeal - Regular Delayed swallow initiation;Premature spillage to valleculae           Pharyngeal - Pill Premature spillage to valleculae;Delayed swallow initiation           CHL IP CERVICAL ESOPHAGEAL PHASE 08/14/2014  Cervical Esophageal Phase WFL    CHL IP GO 08/14/2014  Functional Assessment Tool Used mbs, clinical judgement  Functional Limitations Swallowing  Swallow Current Status (L8756) CI  Swallow Goal Status (E3329) CI  Swallow Discharge Status (J1884) [place], [place] [facility] SLP [phone]

## 2014-08-17 ENCOUNTER — Other Ambulatory Visit: Payer: Self-pay | Admitting: Nurse Practitioner

## 2014-08-17 ENCOUNTER — Other Ambulatory Visit: Payer: Self-pay | Admitting: *Deleted

## 2014-08-17 DIAGNOSIS — R2689 Other abnormalities of gait and mobility: Secondary | ICD-10-CM | POA: Diagnosis not present

## 2014-08-17 DIAGNOSIS — R296 Repeated falls: Secondary | ICD-10-CM | POA: Diagnosis not present

## 2014-08-17 DIAGNOSIS — R42 Dizziness and giddiness: Secondary | ICD-10-CM | POA: Diagnosis not present

## 2014-08-17 MED ORDER — LEVOTHYROXINE SODIUM 25 MCG PO TABS: ORAL_TABLET | ORAL | Status: DC

## 2014-08-17 MED ORDER — MONTELUKAST SODIUM 10 MG PO TABS: ORAL_TABLET | ORAL | Status: DC

## 2014-08-17 NOTE — Telephone Encounter (Signed)
Telephone call opened in wrong chart, pt has a duplicate, call was opened in correct account.

## 2014-08-17 NOTE — Telephone Encounter (Signed)
Stp advised would send over the rx.

## 2014-08-18 DIAGNOSIS — R42 Dizziness and giddiness: Secondary | ICD-10-CM | POA: Diagnosis not present

## 2014-08-18 DIAGNOSIS — R2689 Other abnormalities of gait and mobility: Secondary | ICD-10-CM | POA: Diagnosis not present

## 2014-08-18 DIAGNOSIS — R296 Repeated falls: Secondary | ICD-10-CM | POA: Diagnosis not present

## 2014-08-19 DIAGNOSIS — R296 Repeated falls: Secondary | ICD-10-CM | POA: Diagnosis not present

## 2014-08-19 DIAGNOSIS — R2689 Other abnormalities of gait and mobility: Secondary | ICD-10-CM | POA: Diagnosis not present

## 2014-08-19 DIAGNOSIS — R42 Dizziness and giddiness: Secondary | ICD-10-CM | POA: Diagnosis not present

## 2014-08-20 DIAGNOSIS — R296 Repeated falls: Secondary | ICD-10-CM | POA: Diagnosis not present

## 2014-08-20 DIAGNOSIS — R2689 Other abnormalities of gait and mobility: Secondary | ICD-10-CM | POA: Diagnosis not present

## 2014-08-20 DIAGNOSIS — R42 Dizziness and giddiness: Secondary | ICD-10-CM | POA: Diagnosis not present

## 2014-08-21 ENCOUNTER — Ambulatory Visit: Payer: Commercial Managed Care - HMO | Admitting: Physical Therapy

## 2014-08-21 ENCOUNTER — Encounter: Payer: Self-pay | Admitting: Physical Therapy

## 2014-08-21 DIAGNOSIS — R2689 Other abnormalities of gait and mobility: Secondary | ICD-10-CM | POA: Diagnosis not present

## 2014-08-21 DIAGNOSIS — R269 Unspecified abnormalities of gait and mobility: Secondary | ICD-10-CM

## 2014-08-21 DIAGNOSIS — R296 Repeated falls: Secondary | ICD-10-CM | POA: Diagnosis not present

## 2014-08-21 DIAGNOSIS — R42 Dizziness and giddiness: Secondary | ICD-10-CM | POA: Diagnosis not present

## 2014-08-21 NOTE — Therapy (Signed)
[facility] [place], [place], [address] Phone: [phone]   Fax:  [phone]  Physical Therapy Treatment  Patient Details  Name: Sarah Boyd MRN: [id number] Date of Birth: 1929/12/25 Referring Provider:  Chevis Pretty, *  Encounter Date: 08/21/2014      PT End of Session - 08/21/14 0941    Visit Number 11   Number of Visits 12   PT Start Time 0901   PT Stop Time 0942   PT Time Calculation (min) 41 min   Activity Tolerance Patient tolerated treatment well   Behavior During Therapy [facility] for tasks assessed/performed      Past Medical History  Diagnosis Date  . Labral tear of shoulder RIGHT SHOULDER  . Asthma   . Hypothyroidism   . History of breast cancer S/P RIGHT MASTECTOMY AND CHEMO 21 YRS AGO (APPROX.  1990)    NO RECURRENCE  . Right shoulder pain   . HOH (hard of hearing) NO AIDS    Past Surgical History  Procedure Laterality Date  . Excision of right chest mass  12-19-1999    BENIGN  . Mastectomy  1990  (APPROX.)    RIGHT BREAST CANCER -- NO RECURRENCE  . Orif bilateral wrist  2009  . Cataract extraction, bilateral    . Shoulder arthroscopy  12/06/2011    Procedure: ARTHROSCOPY SHOULDER;  Surgeon: Magnus Sinning, MD;  Location: [facility];  Service: Orthopedics;  Laterality: Right;  WITH LABRIAL DEBRIDEMENT AND SAD INTERSCALINE BLOCK    There were no vitals filed for this visit.  Visit Diagnosis:  Gait disorder  Recurrent falls  Dizziness and giddiness      Subjective Assessment - 08/21/14 0914    Symptoms no new complaints, feeling ok today   Limitations Walking   Currently in Pain? No/denies            [facility] PT Assessment - 08/21/14 0001    Standardized Balance Assessment   Standardized Balance Assessment Berg Balance Test   Berg Balance Test   Sit to Stand Able to stand without using hands and stabilize independently   Standing Unsupported Able to  stand safely 2 minutes   Sitting with Back Unsupported but Feet Supported on Floor or Stool Able to sit safely and securely 2 minutes   Stand to Sit Sits safely with minimal use of hands   Transfers Able to transfer safely, definite need of hands   Standing Unsupported with Eyes Closed Able to stand 3 seconds   Standing Ubsupported with Feet Together Able to place feet together independently but unable to hold for 30 seconds   From Standing, Reach Forward with Outstretched Arm Can reach forward >12 cm safely (5")   From Standing Position, Pick up Object from Floor Able to pick up shoe, needs supervision   From Standing Position, Turn to Look Behind Over each Shoulder Looks behind one side only/other side shows less weight shift   Turn 360 Degrees Able to turn 360 degrees safely but slowly   Standing Unsupported, Alternately Place Feet on Step/Stool Able to complete >2 steps/needs minimal assist   Standing Unsupported, One Foot in Front Loses balance while stepping or standing   Standing on One Leg Unable to try or needs assist to prevent fall   Total Score 35                   [facility] Adult PT Treatment/Exercise - 08/21/14 0001    Knee/Hip  Exercises: Aerobic   Stationary Bike Nustep level 4  x 15 minute   Knee/Hip Exercises: Supine   Other Supine Knee Exercises hip bridge 3 x 10             Balance Exercises - 08/21/14 0935    Balance Exercises: Standing   Standing Eyes Opened Narrow base of support (BOS)   Standing Eyes Closed Wide (BOA)   Tandem Stance Eyes open;2 reps;Upper extremity support 1   SLS --  unable   Standing, One Foot on a Step Eyes open;6 inch   Rockerboard --  39min   Balance Exercises: Seated   Other Seated Exercises sit to stand/transfers x 7                PT Long Term Goals - 07/31/14 1035    PT LONG TERM GOAL #1   Title demonstrate and/or verbalize techniques to reduce the risk of re-injury to include on: fall prevention.   Period  --  12 visits   Status Achieved  achieved   PT LONG TERM GOAL #2   Title be independent with HEP   Period --  12 visits   Status On-going   PT LONG TERM GOAL #3   Title increase Berg to 44/56   Period --  12 visits   PT LONG TERM GOAL #4   Title demonstrate a negative Romberg test   Period --  12 visits               Plan - 08/21/14 0942    Clinical Impression Statement pt has improved with BERG balance score and is able to tolerate all activities. Goals onging.   Pt will benefit from skilled therapeutic intervention in order to improve on the following deficits Abnormal gait   PT Treatment/Interventions Patient/family education;Therapeutic exercise;Gait training;Balance training;Neuromuscular re-education   PT Next Visit Plan cont with POC per MPT,MPT may DC to advanced HEP   Consulted and Agree with Plan of Care Patient        Problem List Patient Active Problem List   Diagnosis Date Noted  . Asthma, chronic 12/05/2012  . Osteoporosis 12/05/2012  . Hypothyroidism 12/05/2012    Phillips Climes, PTA 08/21/2014, 9:46 AM  [facility] [place], [place], [address] Phone: [phone]   Fax:  [phone]

## 2014-08-24 DIAGNOSIS — R42 Dizziness and giddiness: Secondary | ICD-10-CM | POA: Diagnosis not present

## 2014-08-24 DIAGNOSIS — R296 Repeated falls: Secondary | ICD-10-CM | POA: Diagnosis not present

## 2014-08-24 DIAGNOSIS — R2689 Other abnormalities of gait and mobility: Secondary | ICD-10-CM | POA: Diagnosis not present

## 2014-08-25 DIAGNOSIS — R42 Dizziness and giddiness: Secondary | ICD-10-CM | POA: Diagnosis not present

## 2014-08-25 DIAGNOSIS — R296 Repeated falls: Secondary | ICD-10-CM | POA: Diagnosis not present

## 2014-08-25 DIAGNOSIS — R2689 Other abnormalities of gait and mobility: Secondary | ICD-10-CM | POA: Diagnosis not present

## 2014-08-26 DIAGNOSIS — R42 Dizziness and giddiness: Secondary | ICD-10-CM | POA: Diagnosis not present

## 2014-08-26 DIAGNOSIS — R2689 Other abnormalities of gait and mobility: Secondary | ICD-10-CM | POA: Diagnosis not present

## 2014-08-26 DIAGNOSIS — R296 Repeated falls: Secondary | ICD-10-CM | POA: Diagnosis not present

## 2014-08-27 DIAGNOSIS — R42 Dizziness and giddiness: Secondary | ICD-10-CM | POA: Diagnosis not present

## 2014-08-27 DIAGNOSIS — R296 Repeated falls: Secondary | ICD-10-CM | POA: Diagnosis not present

## 2014-08-27 DIAGNOSIS — R2689 Other abnormalities of gait and mobility: Secondary | ICD-10-CM | POA: Diagnosis not present

## 2014-08-28 ENCOUNTER — Ambulatory Visit: Payer: Commercial Managed Care - HMO | Admitting: *Deleted

## 2014-08-28 ENCOUNTER — Encounter: Payer: Self-pay | Admitting: *Deleted

## 2014-08-28 DIAGNOSIS — R296 Repeated falls: Secondary | ICD-10-CM

## 2014-08-28 DIAGNOSIS — R42 Dizziness and giddiness: Secondary | ICD-10-CM

## 2014-08-28 DIAGNOSIS — R269 Unspecified abnormalities of gait and mobility: Secondary | ICD-10-CM | POA: Diagnosis not present

## 2014-08-28 DIAGNOSIS — R2689 Other abnormalities of gait and mobility: Secondary | ICD-10-CM | POA: Diagnosis not present

## 2014-08-28 NOTE — Therapy (Signed)
[facility] [place], [place], [address] Phone: [phone]   Fax:  [phone]  Physical Therapy Treatment  Patient Details  Name: Sarah Boyd MRN: [id number] Date of Birth: 06-Dec-1929 Referring Provider:  Chevis Pretty, *  Encounter Date: 08/28/2014      PT End of Session - 08/28/14 0918    Visit Number 12   Number of Visits 12   Date for PT Re-Evaluation 08/15/14   PT Start Time 0900      Past Medical History  Diagnosis Date  . Labral tear of shoulder RIGHT SHOULDER  . Asthma   . Hypothyroidism   . History of breast cancer S/P RIGHT MASTECTOMY AND CHEMO 21 YRS AGO (APPROX.  1990)    NO RECURRENCE  . Right shoulder pain   . HOH (hard of hearing) NO AIDS    Past Surgical History  Procedure Laterality Date  . Excision of right chest mass  12-19-1999    BENIGN  . Mastectomy  1990  (APPROX.)    RIGHT BREAST CANCER -- NO RECURRENCE  . Orif bilateral wrist  2009  . Cataract extraction, bilateral    . Shoulder arthroscopy  12/06/2011    Procedure: ARTHROSCOPY SHOULDER;  Surgeon: Magnus Sinning, MD;  Location: [facility];  Service: Orthopedics;  Laterality: Right;  WITH LABRIAL DEBRIDEMENT AND SAD INTERSCALINE BLOCK    There were no vitals filed for this visit.  Visit Diagnosis:  Gait disorder  Recurrent falls  Dizziness and giddiness      Subjective Assessment - 08/28/14 0905    Symptoms DC today,    Limitations Walking            [facility] PT Assessment - 08/28/14 0001    Berg Balance Test   Sit to Stand Able to stand  independently using hands   Standing Unsupported Able to stand safely 2 minutes   Sitting with Back Unsupported but Feet Supported on Floor or Stool Able to sit safely and securely 2 minutes   Stand to Sit Controls descent by using hands   Transfers Able to transfer safely, definite need of hands   Standing Unsupported with Eyes Closed Able to stand 3  seconds   Standing Ubsupported with Feet Together Able to place feet together independently but unable to hold for 30 seconds   From Standing, Reach Forward with Outstretched Arm Can reach forward >5 cm safely (2")   From Standing Position, Pick up Object from Floor Able to pick up shoe, needs supervision   From Standing Position, Turn to Look Behind Over each Shoulder Looks behind from both sides and weight shifts well   Turn 360 Degrees Able to turn 360 degrees safely but slowly   Standing Unsupported, Alternately Place Feet on Step/Stool Able to complete 4 steps without aid or supervision   Standing Unsupported, One Foot in Front Loses balance while stepping or standing   Standing on One Leg Unable to try or needs assist to prevent fall   Total Score 34                   [facility] Adult PT Treatment/Exercise - 08/28/14 0001    Standardized Balance Assessment   Standardized Balance Assessment Berg Balance Test   Knee/Hip Exercises: Aerobic   Stationary Bike Nustep level 4  x 15 minute   Knee/Hip Exercises: Standing   Rocker Board 3 minutes  balance and calf stretching  PT Long Term Goals - 08/28/14 0945    PT LONG TERM GOAL #1   Title demonstrate and/or verbalize techniques to reduce the risk of re-injury to include on: fall prevention.   Status Achieved   PT LONG TERM GOAL #2   Title be independent with HEP   Status Achieved   PT LONG TERM GOAL #3   Title increase Berg to 44/56   Status Not Met   PT LONG TERM GOAL #4   Title demonstrate a negative Romberg test   Status Not Met               Plan - 08/28/14 0918    Clinical Impression Statement pt did fair today with Rx, but Berg score was34 today and 35 last week. She continues to have LOB and needs walker for support   Pt will benefit from skilled therapeutic intervention in order to improve on the following deficits Abnormal gait   PT Treatment/Interventions Patient/family  education;Therapeutic exercise;Gait training;Balance training;Neuromuscular re-education   PT Next Visit Plan DC to HEP and discuss with family about possible further testing   PT Home Exercise Plan current balance and therex acts        Problem List Patient Active Problem List   Diagnosis Date Noted  . Asthma, chronic 12/05/2012  . Osteoporosis 12/05/2012  . Hypothyroidism 12/05/2012    Javonte Elenes,CHRIS, PTA 08/28/2014, 9:58 AM  [facility] [address] [place], [place], [address] Phone: [phone]   Fax:  [phone]

## 2014-08-28 NOTE — Therapy (Signed)
[facility] [place], [place], [address] Phone: [phone]   Fax:  [phone]  Physical Therapy Treatment  Patient Details  Name: Sarah Boyd MRN: [id number] Date of Birth: 06-Dec-1929 Referring Provider:  Chevis Pretty, *  Encounter Date: 08/28/2014      PT End of Session - 08/28/14 0918    Visit Number 12   Number of Visits 12   Date for PT Re-Evaluation 08/15/14   PT Start Time 0900      Past Medical History  Diagnosis Date  . Labral tear of shoulder RIGHT SHOULDER  . Asthma   . Hypothyroidism   . History of breast cancer S/P RIGHT MASTECTOMY AND CHEMO 21 YRS AGO (APPROX.  1990)    NO RECURRENCE  . Right shoulder pain   . HOH (hard of hearing) NO AIDS    Past Surgical History  Procedure Laterality Date  . Excision of right chest mass  12-19-1999    BENIGN  . Mastectomy  1990  (APPROX.)    RIGHT BREAST CANCER -- NO RECURRENCE  . Orif bilateral wrist  2009  . Cataract extraction, bilateral    . Shoulder arthroscopy  12/06/2011    Procedure: ARTHROSCOPY SHOULDER;  Surgeon: Magnus Sinning, MD;  Location: [facility];  Service: Orthopedics;  Laterality: Right;  WITH LABRIAL DEBRIDEMENT AND SAD INTERSCALINE BLOCK    There were no vitals filed for this visit.  Visit Diagnosis:  Gait disorder  Recurrent falls  Dizziness and giddiness      Subjective Assessment - 08/28/14 0905    Symptoms DC today,    Limitations Walking            [facility] PT Assessment - 08/28/14 0001    Berg Balance Test   Sit to Stand Able to stand  independently using hands   Standing Unsupported Able to stand safely 2 minutes   Sitting with Back Unsupported but Feet Supported on Floor or Stool Able to sit safely and securely 2 minutes   Stand to Sit Controls descent by using hands   Transfers Able to transfer safely, definite need of hands   Standing Unsupported with Eyes Closed Able to stand 3  seconds   Standing Ubsupported with Feet Together Able to place feet together independently but unable to hold for 30 seconds   From Standing, Reach Forward with Outstretched Arm Can reach forward >5 cm safely (2")   From Standing Position, Pick up Object from Floor Able to pick up shoe, needs supervision   From Standing Position, Turn to Look Behind Over each Shoulder Looks behind from both sides and weight shifts well   Turn 360 Degrees Able to turn 360 degrees safely but slowly   Standing Unsupported, Alternately Place Feet on Step/Stool Able to complete 4 steps without aid or supervision   Standing Unsupported, One Foot in Front Loses balance while stepping or standing   Standing on One Leg Unable to try or needs assist to prevent fall   Total Score 34                   [facility] Adult PT Treatment/Exercise - 08/28/14 0001    Standardized Balance Assessment   Standardized Balance Assessment Berg Balance Test   Knee/Hip Exercises: Aerobic   Stationary Bike Nustep level 4  x 15 minute   Knee/Hip Exercises: Standing   Rocker Board 3 minutes  balance and calf stretching  PT Long Term Goals - September 13, 2014 0945    PT LONG TERM GOAL #1   Title demonstrate and/or verbalize techniques to reduce the risk of re-injury to include on: fall prevention.   Status Achieved   PT LONG TERM GOAL #2   Title be independent with HEP   Status Achieved   PT LONG TERM GOAL #3   Title increase Berg to 44/56   Status Not Met   PT LONG TERM GOAL #4   Title demonstrate a negative Romberg test   Status Not Met               Plan - Sep 13, 2014 0918    Clinical Impression Statement pt did fair today with Rx, but Berg score was34 today and 35 last week. She continues to have LOB and needs walker for support   Pt will benefit from skilled therapeutic intervention in order to improve on the following deficits Abnormal gait   PT Treatment/Interventions Patient/family  education;Therapeutic exercise;Gait training;Balance training;Neuromuscular re-education   PT Next Visit Plan DC to HEP and discuss with family about possible further testing   PT Home Exercise Plan current balance and therex acts          G-Codes - 2014-09-13 1304    Functional Assessment Tool Used Clinical judgement.   Functional Limitation Mobility: Walking and moving around   Mobility: Walking and Moving Around Current Status [phone]) At least 20 percent but less than 40 percent impaired, limited or restricted   Mobility: Walking and Moving Around Goal Status [phone]) At least 1 percent but less than 20 percent impaired, limited or restricted   Mobility: Walking and Moving Around Discharge Status [phone]) At least 1 percent but less than 20 percent impaired, limited or restricted      Problem List Patient Active Problem List   Diagnosis Date Noted  . Asthma, chronic 12/05/2012  . Osteoporosis 12/05/2012  . Hypothyroidism 12/05/2012  PHYSICAL THERAPY DISCHARGE SUMMARY  Visits from Start of Care: 12  Current functional level related to goals / functional outcomes: Please see above.   Remaining deficits: Patient mde good improvement in PT but will need to use her walker full-time for safety.   Education / Equipment: HEP. Plan: Patient agrees to discharge.  Patient goals were met. Patient is being discharged due to meeting the stated rehab goals.  ?????      Sarah Boyd, [place] MPT 09/13/2014, 1:05 PM  [facility] [address] [place], [place], [address] Phone: [phone]   Fax:  [phone]

## 2014-08-31 DIAGNOSIS — R2689 Other abnormalities of gait and mobility: Secondary | ICD-10-CM | POA: Diagnosis not present

## 2014-08-31 DIAGNOSIS — R42 Dizziness and giddiness: Secondary | ICD-10-CM | POA: Diagnosis not present

## 2014-08-31 DIAGNOSIS — R296 Repeated falls: Secondary | ICD-10-CM | POA: Diagnosis not present

## 2014-09-01 DIAGNOSIS — R42 Dizziness and giddiness: Secondary | ICD-10-CM | POA: Diagnosis not present

## 2014-09-01 DIAGNOSIS — R296 Repeated falls: Secondary | ICD-10-CM | POA: Diagnosis not present

## 2014-09-01 DIAGNOSIS — R2689 Other abnormalities of gait and mobility: Secondary | ICD-10-CM | POA: Diagnosis not present

## 2014-09-02 DIAGNOSIS — R42 Dizziness and giddiness: Secondary | ICD-10-CM | POA: Diagnosis not present

## 2014-09-02 DIAGNOSIS — R296 Repeated falls: Secondary | ICD-10-CM | POA: Diagnosis not present

## 2014-09-02 DIAGNOSIS — R2689 Other abnormalities of gait and mobility: Secondary | ICD-10-CM | POA: Diagnosis not present

## 2014-09-03 DIAGNOSIS — R296 Repeated falls: Secondary | ICD-10-CM | POA: Diagnosis not present

## 2014-09-03 DIAGNOSIS — R2689 Other abnormalities of gait and mobility: Secondary | ICD-10-CM | POA: Diagnosis not present

## 2014-09-03 DIAGNOSIS — R42 Dizziness and giddiness: Secondary | ICD-10-CM | POA: Diagnosis not present

## 2014-09-04 DIAGNOSIS — R42 Dizziness and giddiness: Secondary | ICD-10-CM | POA: Diagnosis not present

## 2014-09-04 DIAGNOSIS — R2689 Other abnormalities of gait and mobility: Secondary | ICD-10-CM | POA: Diagnosis not present

## 2014-09-04 DIAGNOSIS — R296 Repeated falls: Secondary | ICD-10-CM | POA: Diagnosis not present

## 2014-09-07 DIAGNOSIS — R42 Dizziness and giddiness: Secondary | ICD-10-CM | POA: Diagnosis not present

## 2014-09-07 DIAGNOSIS — R296 Repeated falls: Secondary | ICD-10-CM | POA: Diagnosis not present

## 2014-09-07 DIAGNOSIS — R2689 Other abnormalities of gait and mobility: Secondary | ICD-10-CM | POA: Diagnosis not present

## 2014-09-08 ENCOUNTER — Emergency Department (HOSPITAL_COMMUNITY): Payer: Commercial Managed Care - HMO

## 2014-09-08 ENCOUNTER — Encounter (HOSPITAL_COMMUNITY): Payer: Self-pay | Admitting: Emergency Medicine

## 2014-09-08 ENCOUNTER — Emergency Department (HOSPITAL_COMMUNITY)
Admission: EM | Admit: 2014-09-08 | Discharge: 2014-09-08 | Disposition: A | Discharge status: Home / Self Care | Payer: Commercial Managed Care - HMO | Attending: Emergency Medicine | Admitting: Emergency Medicine

## 2014-09-08 DIAGNOSIS — W010XXA Fall on same level from slipping, tripping and stumbling without subsequent striking against object, initial encounter: Secondary | ICD-10-CM | POA: Diagnosis not present

## 2014-09-08 DIAGNOSIS — Y998 Other external cause status: Secondary | ICD-10-CM | POA: Insufficient documentation

## 2014-09-08 DIAGNOSIS — W19XXXA Unspecified fall, initial encounter: Secondary | ICD-10-CM

## 2014-09-08 DIAGNOSIS — H9190 Unspecified hearing loss, unspecified ear: Secondary | ICD-10-CM | POA: Insufficient documentation

## 2014-09-08 DIAGNOSIS — S32512A Fracture of superior rim of left pubis, initial encounter for closed fracture: Secondary | ICD-10-CM | POA: Diagnosis not present

## 2014-09-08 DIAGNOSIS — R2689 Other abnormalities of gait and mobility: Secondary | ICD-10-CM | POA: Diagnosis not present

## 2014-09-08 DIAGNOSIS — S79912A Unspecified injury of left hip, initial encounter: Secondary | ICD-10-CM | POA: Diagnosis present

## 2014-09-08 DIAGNOSIS — Z8739 Personal history of other diseases of the musculoskeletal system and connective tissue: Secondary | ICD-10-CM | POA: Insufficient documentation

## 2014-09-08 DIAGNOSIS — Y9289 Other specified places as the place of occurrence of the external cause: Secondary | ICD-10-CM | POA: Insufficient documentation

## 2014-09-08 DIAGNOSIS — S32502A Unspecified fracture of left pubis, initial encounter for closed fracture: Secondary | ICD-10-CM | POA: Diagnosis not present

## 2014-09-08 DIAGNOSIS — E039 Hypothyroidism, unspecified: Secondary | ICD-10-CM | POA: Diagnosis not present

## 2014-09-08 DIAGNOSIS — S32592A Other specified fracture of left pubis, initial encounter for closed fracture: Secondary | ICD-10-CM | POA: Insufficient documentation

## 2014-09-08 DIAGNOSIS — Z853 Personal history of malignant neoplasm of breast: Secondary | ICD-10-CM | POA: Insufficient documentation

## 2014-09-08 DIAGNOSIS — J45909 Unspecified asthma, uncomplicated: Secondary | ICD-10-CM | POA: Diagnosis not present

## 2014-09-08 DIAGNOSIS — Z7951 Long term (current) use of inhaled steroids: Secondary | ICD-10-CM | POA: Insufficient documentation

## 2014-09-08 DIAGNOSIS — Y9301 Activity, walking, marching and hiking: Secondary | ICD-10-CM | POA: Insufficient documentation

## 2014-09-08 DIAGNOSIS — M79605 Pain in left leg: Secondary | ICD-10-CM | POA: Diagnosis not present

## 2014-09-08 DIAGNOSIS — R296 Repeated falls: Secondary | ICD-10-CM | POA: Diagnosis not present

## 2014-09-08 DIAGNOSIS — S79922A Unspecified injury of left thigh, initial encounter: Secondary | ICD-10-CM | POA: Diagnosis not present

## 2014-09-08 DIAGNOSIS — R42 Dizziness and giddiness: Secondary | ICD-10-CM | POA: Diagnosis not present

## 2014-09-08 DIAGNOSIS — Z79899 Other long term (current) drug therapy: Secondary | ICD-10-CM | POA: Insufficient documentation

## 2014-09-08 MED ORDER — TRAMADOL HCL 50 MG PO TABS
50.0000 mg | ORAL_TABLET | Freq: Once | ORAL | Status: AC
  Administered 2014-09-08: 50 mg via ORAL
  Filled 2014-09-08: qty 1

## 2014-09-08 MED ORDER — HYDROCODONE-ACETAMINOPHEN 5-325 MG PO TABS: 1.0000 | ORAL_TABLET | Freq: Four times a day (QID) | ORAL | Status: DC | PRN

## 2014-09-08 MED ORDER — TRAMADOL HCL 50 MG PO TABS: 50.0000 mg | ORAL_TABLET | Freq: Four times a day (QID) | ORAL | Status: DC | PRN

## 2014-09-08 NOTE — ED Notes (Signed)
Assisted pt to bathroom, in wheelchair, pt having pain to put weight on leg, MD aware

## 2014-09-08 NOTE — Discharge Instructions (Signed)
Stable Pelvic Fracture °You have one or more fractures (this means there is a break in the bones) of the pelvis. The pelvis is the ring of bones that make up your hipbones. These are the bones you sit on and the lower part of the spine. It is like a boney ring where your legs attach and which supports your upper body. You have an undisplaced fracture. This means the bones are in good position. The pelvic fracture you have is a simple (uncomplicated) fracture. °DIAGNOSIS  °X-rays usually diagnose these fractures. °TREATMENT  °The goal of treating pelvic fractures is to get the bones to heal in a good position. The patient should return to normal activities as soon as possible. Such fractures are often treated with normal bed rest and conservative measures.  °HOME CARE INSTRUCTIONS  °· You should be on bed rest for as long as directed by your caregiver. Change positions of your legs every 1-2 hours to maintain good blood flow. You may sit as long as is tolerable. Following this, you may do usual activities, but avoid strenuous activities for as long as directed by your caregiver. °· Only take over-the-counter or prescription medicines for pain, discomfort, or fever as directed by your caregiver. °· Bed rest may also be used for discomfort. °· Resume your activities when you are able. Use a cane or crutch on the injured side to reduce pain while walking, as needed. °· If you develop increased pain or discomfort not relieved with medications, contact your caregiver. °· Warning: Do not drive a car or operate a motor vehicle until your caregiver specifically tells you it is safe to do so. °SEEK IMMEDIATE MEDICAL CARE IF:  °· You feel light-headed or faint, develop chest pain or shortness of breath. °· An unexplained oral temperature above 102° F (38.9° C) develops. °· You develop blood in the urine or in the stools. °· There is difficulty urinating, and/or having a bowel movement, or pain with these efforts. °· There is a  difficulty or increased pain with walking. °· There is swelling in one or both legs that is not normal. °Document Released: 08/07/2001 Document Revised: 10/13/2013 Document Reviewed: 01/10/2008 °[employer]® Patient Information ©2015 [employer]. This information is not intended to replace advice given to you by your health care provider. Make sure you discuss any questions you have with your health care provider. ° °
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
## 2014-09-08 NOTE — ED Notes (Signed)
Patient given discharge instruction, verbalized understand. Patient wheelchair out of the department.  

## 2014-09-08 NOTE — ED Notes (Signed)
Patient fell while walking around car. C/o left leg pain. Able to bear weight, but c/o pain.

## 2014-09-08 NOTE — ED Notes (Signed)
MD at the bedside  

## 2014-09-09 DIAGNOSIS — R42 Dizziness and giddiness: Secondary | ICD-10-CM | POA: Diagnosis not present

## 2014-09-09 DIAGNOSIS — R296 Repeated falls: Secondary | ICD-10-CM | POA: Diagnosis not present

## 2014-09-09 DIAGNOSIS — R2689 Other abnormalities of gait and mobility: Secondary | ICD-10-CM | POA: Diagnosis not present

## 2014-09-10 ENCOUNTER — Encounter: Payer: Self-pay | Admitting: *Deleted

## 2014-09-10 DIAGNOSIS — R296 Repeated falls: Secondary | ICD-10-CM | POA: Diagnosis not present

## 2014-09-10 DIAGNOSIS — R42 Dizziness and giddiness: Secondary | ICD-10-CM | POA: Diagnosis not present

## 2014-09-10 DIAGNOSIS — R2689 Other abnormalities of gait and mobility: Secondary | ICD-10-CM | POA: Diagnosis not present

## 2014-09-11 DIAGNOSIS — R42 Dizziness and giddiness: Secondary | ICD-10-CM | POA: Diagnosis not present

## 2014-09-11 DIAGNOSIS — R296 Repeated falls: Secondary | ICD-10-CM | POA: Diagnosis not present

## 2014-09-11 DIAGNOSIS — R2689 Other abnormalities of gait and mobility: Secondary | ICD-10-CM | POA: Diagnosis not present

## 2014-09-14 ENCOUNTER — Telehealth: Payer: Self-pay | Admitting: Nurse Practitioner

## 2014-09-14 DIAGNOSIS — M25559 Pain in unspecified hip: Secondary | ICD-10-CM

## 2014-09-14 DIAGNOSIS — R2689 Other abnormalities of gait and mobility: Secondary | ICD-10-CM | POA: Diagnosis not present

## 2014-09-14 DIAGNOSIS — R296 Repeated falls: Secondary | ICD-10-CM | POA: Diagnosis not present

## 2014-09-14 DIAGNOSIS — R42 Dizziness and giddiness: Secondary | ICD-10-CM | POA: Diagnosis not present

## 2014-09-14 NOTE — Telephone Encounter (Signed)
Referral made 

## 2014-09-15 DIAGNOSIS — R2689 Other abnormalities of gait and mobility: Secondary | ICD-10-CM | POA: Diagnosis not present

## 2014-09-15 DIAGNOSIS — R296 Repeated falls: Secondary | ICD-10-CM | POA: Diagnosis not present

## 2014-09-15 DIAGNOSIS — R42 Dizziness and giddiness: Secondary | ICD-10-CM | POA: Diagnosis not present

## 2014-09-16 DIAGNOSIS — R296 Repeated falls: Secondary | ICD-10-CM | POA: Diagnosis not present

## 2014-09-16 DIAGNOSIS — R2689 Other abnormalities of gait and mobility: Secondary | ICD-10-CM | POA: Diagnosis not present

## 2014-09-16 DIAGNOSIS — R42 Dizziness and giddiness: Secondary | ICD-10-CM | POA: Diagnosis not present

## 2014-09-17 DIAGNOSIS — R42 Dizziness and giddiness: Secondary | ICD-10-CM | POA: Diagnosis not present

## 2014-09-17 DIAGNOSIS — R296 Repeated falls: Secondary | ICD-10-CM | POA: Diagnosis not present

## 2014-09-17 DIAGNOSIS — R2689 Other abnormalities of gait and mobility: Secondary | ICD-10-CM | POA: Diagnosis not present

## 2014-09-18 DIAGNOSIS — R296 Repeated falls: Secondary | ICD-10-CM | POA: Diagnosis not present

## 2014-09-18 DIAGNOSIS — R2689 Other abnormalities of gait and mobility: Secondary | ICD-10-CM | POA: Diagnosis not present

## 2014-09-18 DIAGNOSIS — R42 Dizziness and giddiness: Secondary | ICD-10-CM | POA: Diagnosis not present

## 2014-09-21 DIAGNOSIS — R42 Dizziness and giddiness: Secondary | ICD-10-CM | POA: Diagnosis not present

## 2014-09-21 DIAGNOSIS — R2689 Other abnormalities of gait and mobility: Secondary | ICD-10-CM | POA: Diagnosis not present

## 2014-09-21 DIAGNOSIS — R296 Repeated falls: Secondary | ICD-10-CM | POA: Diagnosis not present

## 2014-09-22 DIAGNOSIS — R2689 Other abnormalities of gait and mobility: Secondary | ICD-10-CM | POA: Diagnosis not present

## 2014-09-22 DIAGNOSIS — R42 Dizziness and giddiness: Secondary | ICD-10-CM | POA: Diagnosis not present

## 2014-09-22 DIAGNOSIS — R296 Repeated falls: Secondary | ICD-10-CM | POA: Diagnosis not present

## 2014-09-23 DIAGNOSIS — R2689 Other abnormalities of gait and mobility: Secondary | ICD-10-CM | POA: Diagnosis not present

## 2014-09-23 DIAGNOSIS — R296 Repeated falls: Secondary | ICD-10-CM | POA: Diagnosis not present

## 2014-09-23 DIAGNOSIS — R42 Dizziness and giddiness: Secondary | ICD-10-CM | POA: Diagnosis not present

## 2014-09-24 DIAGNOSIS — R2689 Other abnormalities of gait and mobility: Secondary | ICD-10-CM | POA: Diagnosis not present

## 2014-09-24 DIAGNOSIS — R296 Repeated falls: Secondary | ICD-10-CM | POA: Diagnosis not present

## 2014-09-24 DIAGNOSIS — R42 Dizziness and giddiness: Secondary | ICD-10-CM | POA: Diagnosis not present

## 2014-09-25 ENCOUNTER — Ambulatory Visit: Payer: Commercial Managed Care - HMO | Admitting: Neurology

## 2014-09-25 DIAGNOSIS — R2689 Other abnormalities of gait and mobility: Secondary | ICD-10-CM | POA: Diagnosis not present

## 2014-09-25 DIAGNOSIS — R296 Repeated falls: Secondary | ICD-10-CM | POA: Diagnosis not present

## 2014-09-25 DIAGNOSIS — R42 Dizziness and giddiness: Secondary | ICD-10-CM | POA: Diagnosis not present

## 2014-09-26 DIAGNOSIS — S32592A Other specified fracture of left pubis, initial encounter for closed fracture: Secondary | ICD-10-CM | POA: Diagnosis not present

## 2014-09-26 DIAGNOSIS — S32512A Fracture of superior rim of left pubis, initial encounter for closed fracture: Secondary | ICD-10-CM | POA: Diagnosis not present

## 2014-09-28 DIAGNOSIS — R42 Dizziness and giddiness: Secondary | ICD-10-CM | POA: Diagnosis not present

## 2014-09-28 DIAGNOSIS — R2689 Other abnormalities of gait and mobility: Secondary | ICD-10-CM | POA: Diagnosis not present

## 2014-09-28 DIAGNOSIS — R296 Repeated falls: Secondary | ICD-10-CM | POA: Diagnosis not present

## 2014-09-29 DIAGNOSIS — R42 Dizziness and giddiness: Secondary | ICD-10-CM | POA: Diagnosis not present

## 2014-09-29 DIAGNOSIS — R296 Repeated falls: Secondary | ICD-10-CM | POA: Diagnosis not present

## 2014-09-29 DIAGNOSIS — R2689 Other abnormalities of gait and mobility: Secondary | ICD-10-CM | POA: Diagnosis not present

## 2014-09-30 DIAGNOSIS — R296 Repeated falls: Secondary | ICD-10-CM | POA: Diagnosis not present

## 2014-09-30 DIAGNOSIS — R2689 Other abnormalities of gait and mobility: Secondary | ICD-10-CM | POA: Diagnosis not present

## 2014-09-30 DIAGNOSIS — R42 Dizziness and giddiness: Secondary | ICD-10-CM | POA: Diagnosis not present

## 2014-10-01 DIAGNOSIS — R296 Repeated falls: Secondary | ICD-10-CM | POA: Diagnosis not present

## 2014-10-01 DIAGNOSIS — R2689 Other abnormalities of gait and mobility: Secondary | ICD-10-CM | POA: Diagnosis not present

## 2014-10-01 DIAGNOSIS — R42 Dizziness and giddiness: Secondary | ICD-10-CM | POA: Diagnosis not present

## 2014-10-02 DIAGNOSIS — R296 Repeated falls: Secondary | ICD-10-CM | POA: Diagnosis not present

## 2014-10-02 DIAGNOSIS — R2689 Other abnormalities of gait and mobility: Secondary | ICD-10-CM | POA: Diagnosis not present

## 2014-10-02 DIAGNOSIS — R42 Dizziness and giddiness: Secondary | ICD-10-CM | POA: Diagnosis not present

## 2014-10-05 DIAGNOSIS — R296 Repeated falls: Secondary | ICD-10-CM | POA: Diagnosis not present

## 2014-10-05 DIAGNOSIS — R2689 Other abnormalities of gait and mobility: Secondary | ICD-10-CM | POA: Diagnosis not present

## 2014-10-05 DIAGNOSIS — R42 Dizziness and giddiness: Secondary | ICD-10-CM | POA: Diagnosis not present

## 2014-10-06 DIAGNOSIS — R2689 Other abnormalities of gait and mobility: Secondary | ICD-10-CM | POA: Diagnosis not present

## 2014-10-06 DIAGNOSIS — R296 Repeated falls: Secondary | ICD-10-CM | POA: Diagnosis not present

## 2014-10-06 DIAGNOSIS — R42 Dizziness and giddiness: Secondary | ICD-10-CM | POA: Diagnosis not present

## 2014-10-07 DIAGNOSIS — R1314 Dysphagia, pharyngoesophageal phase: Secondary | ICD-10-CM | POA: Diagnosis not present

## 2014-10-07 DIAGNOSIS — R2689 Other abnormalities of gait and mobility: Secondary | ICD-10-CM | POA: Diagnosis not present

## 2014-10-07 DIAGNOSIS — R296 Repeated falls: Secondary | ICD-10-CM | POA: Diagnosis not present

## 2014-10-07 DIAGNOSIS — R42 Dizziness and giddiness: Secondary | ICD-10-CM | POA: Diagnosis not present

## 2014-10-12 DIAGNOSIS — R42 Dizziness and giddiness: Secondary | ICD-10-CM | POA: Diagnosis not present

## 2014-10-12 DIAGNOSIS — R2689 Other abnormalities of gait and mobility: Secondary | ICD-10-CM | POA: Diagnosis not present

## 2014-10-12 DIAGNOSIS — R296 Repeated falls: Secondary | ICD-10-CM | POA: Diagnosis not present

## 2014-10-13 DIAGNOSIS — R2689 Other abnormalities of gait and mobility: Secondary | ICD-10-CM | POA: Diagnosis not present

## 2014-10-13 DIAGNOSIS — R42 Dizziness and giddiness: Secondary | ICD-10-CM | POA: Diagnosis not present

## 2014-10-13 DIAGNOSIS — R296 Repeated falls: Secondary | ICD-10-CM | POA: Diagnosis not present

## 2014-10-14 DIAGNOSIS — R2689 Other abnormalities of gait and mobility: Secondary | ICD-10-CM | POA: Diagnosis not present

## 2014-10-14 DIAGNOSIS — R296 Repeated falls: Secondary | ICD-10-CM | POA: Diagnosis not present

## 2014-10-14 DIAGNOSIS — R42 Dizziness and giddiness: Secondary | ICD-10-CM | POA: Diagnosis not present

## 2014-10-15 DIAGNOSIS — R2689 Other abnormalities of gait and mobility: Secondary | ICD-10-CM | POA: Diagnosis not present

## 2014-10-15 DIAGNOSIS — R296 Repeated falls: Secondary | ICD-10-CM | POA: Diagnosis not present

## 2014-10-15 DIAGNOSIS — R42 Dizziness and giddiness: Secondary | ICD-10-CM | POA: Diagnosis not present

## 2014-10-16 DIAGNOSIS — R2689 Other abnormalities of gait and mobility: Secondary | ICD-10-CM | POA: Diagnosis not present

## 2014-10-16 DIAGNOSIS — R296 Repeated falls: Secondary | ICD-10-CM | POA: Diagnosis not present

## 2014-10-16 DIAGNOSIS — R42 Dizziness and giddiness: Secondary | ICD-10-CM | POA: Diagnosis not present

## 2014-10-19 DIAGNOSIS — R42 Dizziness and giddiness: Secondary | ICD-10-CM | POA: Diagnosis not present

## 2014-10-19 DIAGNOSIS — R2689 Other abnormalities of gait and mobility: Secondary | ICD-10-CM | POA: Diagnosis not present

## 2014-10-19 DIAGNOSIS — R296 Repeated falls: Secondary | ICD-10-CM | POA: Diagnosis not present

## 2014-10-20 DIAGNOSIS — R42 Dizziness and giddiness: Secondary | ICD-10-CM | POA: Diagnosis not present

## 2014-10-20 DIAGNOSIS — R2689 Other abnormalities of gait and mobility: Secondary | ICD-10-CM | POA: Diagnosis not present

## 2014-10-20 DIAGNOSIS — R296 Repeated falls: Secondary | ICD-10-CM | POA: Diagnosis not present

## 2014-10-21 DIAGNOSIS — R296 Repeated falls: Secondary | ICD-10-CM | POA: Diagnosis not present

## 2014-10-21 DIAGNOSIS — R2689 Other abnormalities of gait and mobility: Secondary | ICD-10-CM | POA: Diagnosis not present

## 2014-10-21 DIAGNOSIS — R42 Dizziness and giddiness: Secondary | ICD-10-CM | POA: Diagnosis not present

## 2014-10-22 DIAGNOSIS — R42 Dizziness and giddiness: Secondary | ICD-10-CM | POA: Diagnosis not present

## 2014-10-22 DIAGNOSIS — R2689 Other abnormalities of gait and mobility: Secondary | ICD-10-CM | POA: Diagnosis not present

## 2014-10-22 DIAGNOSIS — R296 Repeated falls: Secondary | ICD-10-CM | POA: Diagnosis not present

## 2014-10-23 DIAGNOSIS — R296 Repeated falls: Secondary | ICD-10-CM | POA: Diagnosis not present

## 2014-10-23 DIAGNOSIS — R2689 Other abnormalities of gait and mobility: Secondary | ICD-10-CM | POA: Diagnosis not present

## 2014-10-23 DIAGNOSIS — R42 Dizziness and giddiness: Secondary | ICD-10-CM | POA: Diagnosis not present

## 2014-10-26 DIAGNOSIS — R296 Repeated falls: Secondary | ICD-10-CM | POA: Diagnosis not present

## 2014-10-26 DIAGNOSIS — R2689 Other abnormalities of gait and mobility: Secondary | ICD-10-CM | POA: Diagnosis not present

## 2014-10-26 DIAGNOSIS — R42 Dizziness and giddiness: Secondary | ICD-10-CM | POA: Diagnosis not present

## 2014-10-27 DIAGNOSIS — R42 Dizziness and giddiness: Secondary | ICD-10-CM | POA: Diagnosis not present

## 2014-10-27 DIAGNOSIS — R296 Repeated falls: Secondary | ICD-10-CM | POA: Diagnosis not present

## 2014-10-27 DIAGNOSIS — R2689 Other abnormalities of gait and mobility: Secondary | ICD-10-CM | POA: Diagnosis not present

## 2014-10-28 DIAGNOSIS — R2689 Other abnormalities of gait and mobility: Secondary | ICD-10-CM | POA: Diagnosis not present

## 2014-10-28 DIAGNOSIS — R42 Dizziness and giddiness: Secondary | ICD-10-CM | POA: Diagnosis not present

## 2014-10-28 DIAGNOSIS — R296 Repeated falls: Secondary | ICD-10-CM | POA: Diagnosis not present

## 2014-10-29 DIAGNOSIS — R42 Dizziness and giddiness: Secondary | ICD-10-CM | POA: Diagnosis not present

## 2014-10-29 DIAGNOSIS — R2689 Other abnormalities of gait and mobility: Secondary | ICD-10-CM | POA: Diagnosis not present

## 2014-10-29 DIAGNOSIS — R296 Repeated falls: Secondary | ICD-10-CM | POA: Diagnosis not present

## 2014-10-30 DIAGNOSIS — R296 Repeated falls: Secondary | ICD-10-CM | POA: Diagnosis not present

## 2014-10-30 DIAGNOSIS — R2689 Other abnormalities of gait and mobility: Secondary | ICD-10-CM | POA: Diagnosis not present

## 2014-10-30 DIAGNOSIS — R42 Dizziness and giddiness: Secondary | ICD-10-CM | POA: Diagnosis not present

## 2014-11-02 DIAGNOSIS — R296 Repeated falls: Secondary | ICD-10-CM | POA: Diagnosis not present

## 2014-11-02 DIAGNOSIS — R2689 Other abnormalities of gait and mobility: Secondary | ICD-10-CM | POA: Diagnosis not present

## 2014-11-02 DIAGNOSIS — R42 Dizziness and giddiness: Secondary | ICD-10-CM | POA: Diagnosis not present

## 2014-11-03 DIAGNOSIS — R2689 Other abnormalities of gait and mobility: Secondary | ICD-10-CM | POA: Diagnosis not present

## 2014-11-03 DIAGNOSIS — R296 Repeated falls: Secondary | ICD-10-CM | POA: Diagnosis not present

## 2014-11-03 DIAGNOSIS — R42 Dizziness and giddiness: Secondary | ICD-10-CM | POA: Diagnosis not present

## 2014-11-04 ENCOUNTER — Ambulatory Visit (INDEPENDENT_AMBULATORY_CARE_PROVIDER_SITE_OTHER): Payer: Commercial Managed Care - HMO | Admitting: Neurology

## 2014-11-04 ENCOUNTER — Encounter: Payer: Self-pay | Admitting: Neurology

## 2014-11-04 VITALS — BP 124/70 | HR 72 | Resp 14 | Ht 61.0 in | Wt 103.0 lb

## 2014-11-04 DIAGNOSIS — S329XXS Fracture of unspecified parts of lumbosacral spine and pelvis, sequela: Secondary | ICD-10-CM | POA: Diagnosis not present

## 2014-11-04 DIAGNOSIS — R269 Unspecified abnormalities of gait and mobility: Secondary | ICD-10-CM | POA: Diagnosis not present

## 2014-11-04 DIAGNOSIS — W19XXXS Unspecified fall, sequela: Secondary | ICD-10-CM | POA: Diagnosis not present

## 2014-11-04 DIAGNOSIS — R42 Dizziness and giddiness: Secondary | ICD-10-CM | POA: Diagnosis not present

## 2014-11-04 DIAGNOSIS — R296 Repeated falls: Secondary | ICD-10-CM

## 2014-11-04 DIAGNOSIS — R2689 Other abnormalities of gait and mobility: Secondary | ICD-10-CM | POA: Diagnosis not present

## 2014-11-04 NOTE — Progress Notes (Signed)
Subjective:    Patient ID: Sarah Boyd is a 81 y.o. female.  HPI     Interim history:   Sarah Boyd is an 81 year old right-handed woman with an underlying medical history of hypothyroidism, breast cancer, hearing loss, arthritis, asthma, and bilateral cataract repairs, who presents for followup consultation of her gait disturbance, balance problems and recurrent falls. She is accompanied by her niece, Sarah Boyd, and Sarah Boyd today. I last saw her on 05/26/2014, at which time Sarah Boyd reported that the patient fell about 3 times in the prior 6 months. Thankfully she had not injured herself. She was not using her walker inside the house. She had no loss of consciousness or head injuries. She had gone to the emergency room with cough and mild fever and chest x-ray was negative. She was treated with a nebulizer and prescribed prednisone.  In the interim, she presented to the emergency room on 08/02/2014 after fall. I reviewed the emergency room records. She hit her head but had no loss of consciousness. She had a superficial laceration. She had a head CT without contrast on 08/02/2014 as well as a CT cervical spine without contrast which I reviewed: No acute traumatic injury to the cervical spine. No acute intracranial abnormalities. In addition, personally reviewed the head CT through the PACS system and agree with the findings.  In the interim, she presented back to the emergency room on 09/08/2014 after another fall. I reviewed the hospital emergency room records. She complained of leg pain on the left and also pelvic pain. CT of the left hip without contrast on 09/08/2014 showed: Mildly comminuted mildly displaced left superior pubic ramus fracture. Nondisplaced left inferior pubic ramus fracture. No extension to the acetabulum.   She was symptomatically treated for pain. Her primary care provider referred her to orthopedics in April 2016.  Today, 11/04/2014: She reports doing better as  far as the pelvic pain and sore L hip goes. She moved in with Sarah Boyd in February after her fall. If she does not rush, per Sarah Boyd, she does okay. She saw orthopedics, Dr. Cay Boyd, and was released, as she was doing better. Additional information is provided by Sarah Boyd. She had a swallow study with Dr. Lenard Boyd on 08/31/14, which showed mild aspiration with thin liquids. She may not be drinking enough water.  Previously:   I saw her on 05/12/2013, at which time she was living by herself and I asked her to get a call alert button. I asked her to use her walker at all times for safety. In the interim, she was seen by our nurse practitioner, Ms. Sarah Boyd, on 11/24/2013 at which time she was using a cane more consistently. She had gotten a life alert button. She had not fallen recently.  I saw her on 11/05/2012, in which time I asked her to use her walker at all times. I also advised her to look into a life alert button. I felt, she most likely had a multifactorial gait disorder, d/t a combination of intermittent vertigo, arthritis, CNS atherosclerosis, and orthostatic hypotension. In the interim, she fell on 12/01/2012 and broke a rib. She fell against the dresser. Her niece goes to see her at least once a week and helps her with groceries. The patient loves to walk and has been using her walker consistently. She has walked to [employer] and to [employer]. She has been through PT.   I first met her on 08/05/2012, at which time she gave a several year history of gait  and balance problems. She fell in 2012 and fractured both wrists. Years ago she fell off the porch and hurt her right shoulder. She has a history of breast cancer on the right, status post right mastectomy and chemotherapy. She was also complaining of lightheadedness upon standing quickly. She has an underlying medical history of hypothyroidism, osteoporosis, chronic lung disease, past history of breast cancer, status post right mastectomy and chemotherapy. At the time  of her first visit with me she displayed mild gait insecurity but no frank ataxia. I felt she had multifactorial gait disturbance due to arthritis, cerebrovascular atherosclerosis, orthostatic hypotension, advanced age, and possible intermittent vertigo. I suggested a brain MRI with and without contrast and recommended physical therapy for gait and balance training. She was advised to use meclizine as needed. She has not been using it and was not sure if it was helpful.   Her brain MRI from 08/15/12 showed mild changes of age-appropriate generalized cerebral atrophy and chronic microvascular ischemia.   She lives in a 2 storey house by herself. If she gets up too quickly, she feels off balance and lightheaded. She was in PT in [place], [place], but I do not have a report available.   She recently had a check up with her PCP.   Her Past Medical History Is Significant For: Past Medical History  Diagnosis Date  . Labral tear of shoulder RIGHT SHOULDER  . Asthma   . Hypothyroidism   . History of breast cancer S/P RIGHT MASTECTOMY AND CHEMO 21 YRS AGO (APPROX.  1990)    NO RECURRENCE  . Right shoulder pain   . HOH (hard of hearing) NO AIDS    Her Past Surgical History Is Significant For: Past Surgical History  Procedure Laterality Date  . Excision of right chest mass  12-19-1999    BENIGN  . Mastectomy  1990  (APPROX.)    RIGHT BREAST CANCER -- NO RECURRENCE  . Orif bilateral wrist  2009  . Cataract extraction, bilateral    . Shoulder arthroscopy  12/06/2011    Procedure: ARTHROSCOPY SHOULDER;  Surgeon: Sarah Sinning, MD;  Location: [facility];  Service: Orthopedics;  Laterality: Right;  WITH LABRIAL DEBRIDEMENT AND SAD INTERSCALINE BLOCK    Her Family History Is Significant For: Family History  Problem Relation Age of Onset  . Stroke Mother     Her Social History Is Significant For: History   Social History  . Marital Status: Single    Spouse Name: N/A  . Number of  Children: 0  . Years of Education: College   Occupational History  . Retired    Social History Main Topics  . Smoking status: Never Smoker   . Smokeless tobacco: Never Used  . Alcohol Use: No  . Drug Use: No  . Sexual Activity: Not on file   Other Topics Concern  . None   Social History Narrative   Patient lives at home alone.   Caffeine Use; none    Her Allergies Are:  Allergies  Allergen Reactions  . Benadryl [Diphenhydramine Hcl] Other (See Comments)    ALTERED MENTAL STATIS  . Chocolate Other (See Comments)    Religous belief  :   Her Current Medications Are:  Outpatient Encounter Prescriptions as of 11/04/2014  Medication Sig  . albuterol (PROAIR HFA) 108 (90 BASE) MCG/ACT inhaler Inhale 2 puffs into the lungs every 6 (six) hours as needed for wheezing or shortness of breath.  Marland Kitchen alendronate (FOSAMAX) 70 MG  tablet TAKE 1 TABLET BY MOUTH ONCE A WEEK WITH A FULL GLASS OF WATER ON AN EMPTY STOMACH (Patient taking differently: Take 70 mg by mouth once a week. Take on Sunday.)  . alendronate (FOSAMAX) 70 MG tablet TAKE 1 TABLET BY MOUTH ONCE A WEEK WITH A FULL GLASS OF WATER ON AN EMPTY STOMACH  . Calcium Carbonate-Vitamin D (CALCIUM + D PO) Take 1 tablet by mouth daily.  . Fluticasone-Salmeterol (ADVAIR) 100-50 MCG/DOSE AEPB Inhale 1 puff into the lungs 2 (two) times daily.  Marland Kitchen HYDROcodone-acetaminophen (NORCO/VICODIN) 5-325 MG per tablet Take 1-2 tablets by mouth every 6 (six) hours as needed for moderate pain.  Marland Kitchen levothyroxine (SYNTHROID, LEVOTHROID) 25 MCG tablet TAKE 1 TABLET EVERY MORNING ON AN EMPTY STOMACH NEEDS TO BE SEEN  . meclizine (ANTIVERT) 25 MG tablet Take 1 tablet (25 mg total) by mouth 3 (three) times daily as needed. (Patient taking differently: Take 12.5 mg by mouth 2 (two) times daily. )  . montelukast (SINGULAIR) 10 MG tablet TAKE 1 TABLET (10 MG TOTAL) BY MOUTH AT BEDTIME.  . traMADol (ULTRAM) 50 MG tablet Take 1 tablet (50 mg total) by mouth every 6  (six) hours as needed.  . [DISCONTINUED] Polyethyl Glycol-Propyl Glycol (SYSTANE OP) Apply 1 drop to eye daily as needed (dry eyes).   No facility-administered encounter medications on file as of 11/04/2014.  :  Review of Systems:  Out of a complete 14 point review of systems, all are reviewed and negative with the exception of these symptoms as listed below:   Review of Systems  HENT: Positive for hearing loss.   Respiratory: Positive for cough.   Neurological:       Memory loss, reported fall   Hematological: Bruises/bleeds easily.    Objective:  Neurologic Exam  Physical Exam Physical Examination:   Filed Vitals:   11/04/14 1223  BP: 124/70  Pulse: 72  Resp: 14    General Examination: The patient is a very pleasant 81 y.o. female in no acute distress. She appears frail. She denies orthostatic symptoms and does not have any vertigo upon change in position.  HEENT: Normocephalic, atraumatic, pupils are equal, round and reactive to light and accommodation. Hearing is mildly impaired bilaterally. She has bilateral cataract repairs. She has a hearing aid on the right. Neck is supple with full range of motion. She has no symptoms of vertigo upon sudden changes in head position. Extraocular tracking is good without nystagmus noted. Speech is clear. Oropharynx examination reveals mild mouth dryness otherwise no significant findings. Tongue protrudes centrally and palate elevates symmetrically. Neck auscultation reveals no carotid bruits.  Chest is clear to auscultation without wheezing or rhonchi noted.  Heart sounds are normal without murmurs, rubs or gallops noted. Abdomen is soft, nontender with normal bowel sounds noted. She has no pitting edema in the distal lower extremities. Skin is warm and dry. I do not appreciate any trophic changes and no joint deformities are noted and no joint swelling. She has mild arthritic changes in her hands and some stiffness and decrease in ROM in her  R shoulder.  Neurologically: Mental status: The patient is awake, alert and oriented in all 4 spheres. Her memory, attention, language and knowledge are age appropriate. Cranial nerves are as described above. Motor exam reveals normal bulk and strength for age. She perhaps has a slight degree of hip flexor weakness bilaterally, unchanged. Reflexes are 1+ throughout including her ankles. Cerebellar testing shows no dysmetria or intention tremor on finger  to nose testing and no truncal ataxia. Sensory exam is intact to light touch. Upon standing she has no significant lightheadedness, but she stands more slowly. She walks more slowly. Romberg testing shows minimal swaying but no corrective steps. She has a tendency to turn too fast.            Assessment and Plan:   In summary, Ms. Penado is an 81 year old lady with a several year history of gait and balance problems. Since her last visit in December, unfortunately she has fallen 2 times. She hit her head and needed staples and she also fell and broke her pelvis. She is using her rolling walker more consistently but she does have a tendency to turn too fast. I would like for her to see physical therapy for evaluation of a 2 wheeled walker which may help her slow down a little bit. Her memory and her other physical and neurological exam appears stable otherwise. She most likely has a multi-factorial gait disturbance d/t a combination of intermittent vertigo, arthritis, CNS atherosclerosis, orthostatic hypotension, dehydration, advancing age and overall frailty. She is advised to drink more water. She is advised to change positions slowly and also turn slowly. She is advised to use her walker at all times and she may do better with a 2 wheeled walker. She has moved in with her niece, which I think is a good idea. Meclizine has made her sleepy. I would advise against taking meclizine on a regular basis. She and her niece demonstrated understanding and voiced  agreement. I will see her back in 4 to 5 months from now, sooner if the need arises. They are advised to call with any interim questions, concerns, problems or updates. I spent 20 minutes in total face-to-face time with the patient, more than 50% of which was spent in counseling and coordination of care, reviewing test results, reviewing medication and discussing or reviewing the diagnosis of gait d/o, its prognosis and treatment options.

## 2014-11-04 NOTE — Patient Instructions (Signed)
Please drink more water!  Use your walker at all times!  I have referred you to physical therapy for evaluation for another walker.   Change positions slowly and turn slowly.   You are at fall risk.

## 2014-11-05 DIAGNOSIS — R2689 Other abnormalities of gait and mobility: Secondary | ICD-10-CM | POA: Diagnosis not present

## 2014-11-05 DIAGNOSIS — R42 Dizziness and giddiness: Secondary | ICD-10-CM | POA: Diagnosis not present

## 2014-11-05 DIAGNOSIS — R296 Repeated falls: Secondary | ICD-10-CM | POA: Diagnosis not present

## 2014-11-06 DIAGNOSIS — R296 Repeated falls: Secondary | ICD-10-CM | POA: Diagnosis not present

## 2014-11-06 DIAGNOSIS — R2689 Other abnormalities of gait and mobility: Secondary | ICD-10-CM | POA: Diagnosis not present

## 2014-11-06 DIAGNOSIS — R42 Dizziness and giddiness: Secondary | ICD-10-CM | POA: Diagnosis not present

## 2014-11-09 DIAGNOSIS — R2689 Other abnormalities of gait and mobility: Secondary | ICD-10-CM | POA: Diagnosis not present

## 2014-11-09 DIAGNOSIS — R42 Dizziness and giddiness: Secondary | ICD-10-CM | POA: Diagnosis not present

## 2014-11-09 DIAGNOSIS — R296 Repeated falls: Secondary | ICD-10-CM | POA: Diagnosis not present

## 2014-11-10 DIAGNOSIS — R42 Dizziness and giddiness: Secondary | ICD-10-CM | POA: Diagnosis not present

## 2014-11-10 DIAGNOSIS — R296 Repeated falls: Secondary | ICD-10-CM | POA: Diagnosis not present

## 2014-11-10 DIAGNOSIS — R2689 Other abnormalities of gait and mobility: Secondary | ICD-10-CM | POA: Diagnosis not present

## 2014-11-11 DIAGNOSIS — R296 Repeated falls: Secondary | ICD-10-CM | POA: Diagnosis not present

## 2014-11-11 DIAGNOSIS — R2689 Other abnormalities of gait and mobility: Secondary | ICD-10-CM | POA: Diagnosis not present

## 2014-11-11 DIAGNOSIS — R42 Dizziness and giddiness: Secondary | ICD-10-CM | POA: Diagnosis not present

## 2014-11-12 DIAGNOSIS — R42 Dizziness and giddiness: Secondary | ICD-10-CM | POA: Diagnosis not present

## 2014-11-12 DIAGNOSIS — R296 Repeated falls: Secondary | ICD-10-CM | POA: Diagnosis not present

## 2014-11-12 DIAGNOSIS — R2689 Other abnormalities of gait and mobility: Secondary | ICD-10-CM | POA: Diagnosis not present

## 2014-11-13 DIAGNOSIS — R42 Dizziness and giddiness: Secondary | ICD-10-CM | POA: Diagnosis not present

## 2014-11-13 DIAGNOSIS — R2689 Other abnormalities of gait and mobility: Secondary | ICD-10-CM | POA: Diagnosis not present

## 2014-11-13 DIAGNOSIS — R296 Repeated falls: Secondary | ICD-10-CM | POA: Diagnosis not present

## 2014-11-16 DIAGNOSIS — R42 Dizziness and giddiness: Secondary | ICD-10-CM | POA: Diagnosis not present

## 2014-11-16 DIAGNOSIS — R296 Repeated falls: Secondary | ICD-10-CM | POA: Diagnosis not present

## 2014-11-16 DIAGNOSIS — R2689 Other abnormalities of gait and mobility: Secondary | ICD-10-CM | POA: Diagnosis not present

## 2014-11-17 DIAGNOSIS — R42 Dizziness and giddiness: Secondary | ICD-10-CM | POA: Diagnosis not present

## 2014-11-17 DIAGNOSIS — R296 Repeated falls: Secondary | ICD-10-CM | POA: Diagnosis not present

## 2014-11-17 DIAGNOSIS — R2689 Other abnormalities of gait and mobility: Secondary | ICD-10-CM | POA: Diagnosis not present

## 2014-11-18 DIAGNOSIS — R296 Repeated falls: Secondary | ICD-10-CM | POA: Diagnosis not present

## 2014-11-18 DIAGNOSIS — R2689 Other abnormalities of gait and mobility: Secondary | ICD-10-CM | POA: Diagnosis not present

## 2014-11-18 DIAGNOSIS — R42 Dizziness and giddiness: Secondary | ICD-10-CM | POA: Diagnosis not present

## 2014-11-19 DIAGNOSIS — R42 Dizziness and giddiness: Secondary | ICD-10-CM | POA: Diagnosis not present

## 2014-11-19 DIAGNOSIS — R296 Repeated falls: Secondary | ICD-10-CM | POA: Diagnosis not present

## 2014-11-19 DIAGNOSIS — R2689 Other abnormalities of gait and mobility: Secondary | ICD-10-CM | POA: Diagnosis not present

## 2014-11-20 DIAGNOSIS — R42 Dizziness and giddiness: Secondary | ICD-10-CM | POA: Diagnosis not present

## 2014-11-20 DIAGNOSIS — R296 Repeated falls: Secondary | ICD-10-CM | POA: Diagnosis not present

## 2014-11-20 DIAGNOSIS — R2689 Other abnormalities of gait and mobility: Secondary | ICD-10-CM | POA: Diagnosis not present

## 2014-11-23 DIAGNOSIS — R2689 Other abnormalities of gait and mobility: Secondary | ICD-10-CM | POA: Diagnosis not present

## 2014-11-23 DIAGNOSIS — R296 Repeated falls: Secondary | ICD-10-CM | POA: Diagnosis not present

## 2014-11-23 DIAGNOSIS — R42 Dizziness and giddiness: Secondary | ICD-10-CM | POA: Diagnosis not present

## 2014-11-24 DIAGNOSIS — R42 Dizziness and giddiness: Secondary | ICD-10-CM | POA: Diagnosis not present

## 2014-11-24 DIAGNOSIS — R296 Repeated falls: Secondary | ICD-10-CM | POA: Diagnosis not present

## 2014-11-24 DIAGNOSIS — R2689 Other abnormalities of gait and mobility: Secondary | ICD-10-CM | POA: Diagnosis not present

## 2014-11-25 DIAGNOSIS — R2689 Other abnormalities of gait and mobility: Secondary | ICD-10-CM | POA: Diagnosis not present

## 2014-11-25 DIAGNOSIS — R296 Repeated falls: Secondary | ICD-10-CM | POA: Diagnosis not present

## 2014-11-25 DIAGNOSIS — R42 Dizziness and giddiness: Secondary | ICD-10-CM | POA: Diagnosis not present

## 2014-11-26 DIAGNOSIS — R296 Repeated falls: Secondary | ICD-10-CM | POA: Diagnosis not present

## 2014-11-26 DIAGNOSIS — R2689 Other abnormalities of gait and mobility: Secondary | ICD-10-CM | POA: Diagnosis not present

## 2014-11-26 DIAGNOSIS — R42 Dizziness and giddiness: Secondary | ICD-10-CM | POA: Diagnosis not present

## 2014-11-27 ENCOUNTER — Ambulatory Visit: Payer: Commercial Managed Care - HMO | Attending: Neurology | Admitting: Physical Therapy

## 2014-11-27 ENCOUNTER — Encounter: Payer: Self-pay | Admitting: Physical Therapy

## 2014-11-27 DIAGNOSIS — R269 Unspecified abnormalities of gait and mobility: Secondary | ICD-10-CM | POA: Insufficient documentation

## 2014-11-27 DIAGNOSIS — R42 Dizziness and giddiness: Secondary | ICD-10-CM | POA: Diagnosis not present

## 2014-11-27 DIAGNOSIS — R278 Other lack of coordination: Secondary | ICD-10-CM

## 2014-11-27 DIAGNOSIS — R296 Repeated falls: Secondary | ICD-10-CM | POA: Insufficient documentation

## 2014-11-27 DIAGNOSIS — R2689 Other abnormalities of gait and mobility: Secondary | ICD-10-CM | POA: Diagnosis not present

## 2014-11-27 NOTE — Therapy (Addendum)
[facility] [address] [place], [place], [address] Phone: [phone]   Fax:  [phone]  Physical Therapy Evaluation  Patient Details  Name: Sarah Boyd MRN: [id number] Date of Birth: [birth year] Referring Provider:  Star Age, MD  Encounter Date: 11/27/2014      PT End of Session - 11/27/14 2216    Visit Number 1   Number of Visits 17   Date for PT Re-Evaluation 01/26/15   Authorization Type [employer]   PT Start Time 9892   PT Stop Time 1115   PT Time Calculation (min) 60 min   Equipment Utilized During Treatment Gait belt   Activity Tolerance Patient tolerated treatment well   Behavior During Therapy [facility] for tasks assessed/performed      Past Medical History  Diagnosis Date  . Labral tear of shoulder RIGHT SHOULDER  . Asthma   . Hypothyroidism   . History of breast cancer S/P RIGHT MASTECTOMY AND CHEMO 21 YRS AGO (APPROX.  1990)    NO RECURRENCE  . Right shoulder pain   . HOH (hard of hearing) NO AIDS    Past Surgical History  Procedure Laterality Date  . Excision of right chest mass  12-19-1999    BENIGN  . Mastectomy  1990  (APPROX.)    RIGHT BREAST CANCER -- NO RECURRENCE  . Orif bilateral wrist  2009  . Cataract extraction, bilateral    . Shoulder arthroscopy  12/06/2011    Procedure: ARTHROSCOPY SHOULDER;  Surgeon: Magnus Sinning, MD;  Location: [facility];  Service: Orthopedics;  Laterality: Right;  WITH LABRIAL DEBRIDEMENT AND SAD INTERSCALINE BLOCK    There were no vitals filed for this visit.  Visit Diagnosis:  Abnormality of gait  Coordination abnormal  Dizziness and giddiness      Subjective Assessment - 11/27/14 1031    Subjective Pt present with neice, Fleta, who reports pt has sustained increasingly more falls since starting to use rollator. Pt became dizzy when turning with rollator at most recent MD appt. Pt sustained pelvic fracture during fall on  09/14/14.    Patient is accompained by: Family member   Limitations Walking   Currently in Pain? No/denies            [facility] PT Assessment - 11/27/14 0001    Assessment   Medical Diagnosis gait disorder, recurrent falls, dizzines and giddines   Onset Date/Surgical Date 09/14/14  date of hospitalization for pelvic fx due to fall   Prior Therapy D/C'ed from OP neuro in February 2016   Precautions   Precautions Fall   Restrictions   Weight Bearing Restrictions No   Balance Screen   Has the patient fallen in the past 6 months Yes   How many times? 3   Has the patient had a decrease in activity level because of a fear of falling?  Yes   Is the patient reluctant to leave their home because of a fear of falling?  Yes   [place] Private residence   Living Arrangements Other relatives  lives with niece, Ceres   Available Help at Discharge Family  niece, Mellody Dance   Type of [place] to enter   Entrance Stairs-Number of Steps 3   Entrance Stairs-Rails Can reach both   [place] One level   [place] - 4 wheels;Cane - single point   Prior Function   Level of Independence Requires  assistive device for independence   Vocation Retired   Leisure likes to Psychologist, occupational at Therapist, music Movements are Fluid and Coordinated Yes   ROM / Strength   AROM / PROM / Strength Strength   Strength   Overall Strength Deficits   Overall Strength Comments Grossly 4/5 in B hips and R ankle; 4+/5 in B knees, ankles   Transfers   Transfers Sit to Stand;Stand to Sit   Sit to Stand 6: Modified independent (Device/Increase time)   Stand to Sit 6: Modified independent (Device/Increase time)   Ambulation/Gait   Ambulation/Gait Yes   Ambulation/Gait Assistance 5: Supervision   Ambulation Distance (Feet) 300 Feet   Assistive device Rollator   Gait velocity 2.00 ft/sec    Standardized Balance Assessment   Standardized Balance Assessment Dynamic Gait Index   Dynamic Gait Index   Level Surface Mild Impairment  using rollator   Change in Gait Speed Mild Impairment   Gait with Horizontal Head Turns Moderate Impairment   Gait with Vertical Head Turns Mild Impairment  however, minimal head movement   Gait and Pivot Turn Moderate Impairment   Step Over Obstacle Moderate Impairment   Step Around Obstacles Moderate Impairment   Steps Mild Impairment   Total Score 12               PT Education - 11/27/14 2214    Education provided Yes   Education Details Evaluation findings, goals, and POC.   Person(s) Educated Patient;Caregiver(s)   Methods Explanation   Comprehension Verbalized understanding          PT Short Term Goals - 11/27/14 2224    PT SHORT TERM GOAL #1   Title Pt will perform HEP with mod I using paper handout to maximize functional gains made in physical therapy. Target: 12/25/14.   Time 4   Period Weeks   Status New   PT SHORT TERM GOAL #2   Title Pt will verbalize understanding of fall prevention strategies to decrease risk of falling within home.  12/25/14.   Time 4   Period Weeks   Status New   PT SHORT TERM GOAL #3   Title Pt will increase Dynamic Gait Index score from 12/24 to 15/24 to demo progress toward significant improvement in dynamic gait stability. 12/25/14.   Time 4   Period Weeks   Status New           PT Long Term Goals - 11/27/14 2228    PT LONG TERM GOAL #1   Title Pt will score 19/24 on Dynamic Gait Index to demonstrate improved gait stability in the presence of external demands. Target: 01/22/15.   PT LONG TERM GOAL #2   Title PT will ambulate x500' over level, indoor surfaces with mod I using LRAD with effective obstacle negotiation and no gait instability during turns. Target: 01/22/15.   Time 8   Period Weeks   Status New   PT LONG TERM GOAL #3   Title Pt will ambulate x500' over uneven, concrete  surfaces with LRAD and mod I to progress toward community ambulation. Target: 01/22/15.   Time 8   Period Weeks   Status New   PT LONG TERM GOAL #4   Title  Pt will increae self-selected gait speed from 2.0 to > or equal to 2.63 ft/sec to reach status as community ambulator. Target: 01/22/15.   Time 8  Period Weeks   Status New               Plan - 11/27/14 2218    Clinical Impression Statement Pt is an 81 y/o F presenting to outpatient PT due to gait instability, disequilibrium, and onset of falls within the past 6 months. Pt was hospitalized for pelvic fracture due to fall on 09/14/14. PT evaluation reveals the following impairments: gait speed indicative of status as limited community ambulator; DGI score of 12/24, suggesting increased fall risk; unsafe use of assistive device (rollator); disequlibrium/gait instability with turning; and generalized weaknes in B hips. Recommending rolling walker due to decreased safety with rolling walker. Pt will benefit from skilled PT 2x/week for 8 weeks to address saiid impairments and decrease fall risk,   Pt will benefit from skilled therapeutic intervention in order to improve on the following deficits Abnormal gait;Decreased balance;Impaired perceived functional ability;Decreased strength;Decreased coordination;Dizziness   Rehab Potential Good   PT Frequency 2x / week   PT Duration 8 weeks   PT Treatment/Interventions Patient/family education;Therapeutic exercise;Gait training;Balance training;Neuromuscular re-education;Vestibular;ADLs/Self Care Home Management;DME Instruction;Stair training;Therapeutic activities;Functional mobility training   PT Next Visit Plan Initiate HEP; educate on fall prevention strategie within home. Address gaze stabilization, if applicable. Dynamic gait stability.   Consulted and Agree with Plan of Care Patient;Family member/caregiver   Family Member Consulted Niece, Mellody Dance     Addendum: Functional Assessment Tool  Used: DGI score: 12/24  Functional Limitation: Mobility: Walking and moving around Current Status [phone]): CK-At least 40 percent but less than 60 percent impaired, limited or restricted Goal Status ([id number]): CJ - At least 20 percent but less than 40 percent impaired, limited or restricted     Problem List Patient Active Problem List   Diagnosis Date Noted  . Asthma, chronic 12/05/2012  . Osteoporosis 12/05/2012  . Hypothyroidism 12/05/2012    Billie Ruddy, PT, DPT [facility] [address] [place], [place], [address] Phone: [phone]   Fax:  [phone] 11/27/2014, 10:39 PM

## 2014-11-30 DIAGNOSIS — R2689 Other abnormalities of gait and mobility: Secondary | ICD-10-CM | POA: Diagnosis not present

## 2014-11-30 DIAGNOSIS — R42 Dizziness and giddiness: Secondary | ICD-10-CM | POA: Diagnosis not present

## 2014-11-30 DIAGNOSIS — R296 Repeated falls: Secondary | ICD-10-CM | POA: Diagnosis not present

## 2014-12-01 ENCOUNTER — Ambulatory Visit: Payer: Commercial Managed Care - HMO | Admitting: Physical Therapy

## 2014-12-01 ENCOUNTER — Encounter: Payer: Self-pay | Admitting: Physical Therapy

## 2014-12-01 DIAGNOSIS — R2689 Other abnormalities of gait and mobility: Secondary | ICD-10-CM | POA: Diagnosis not present

## 2014-12-01 DIAGNOSIS — R269 Unspecified abnormalities of gait and mobility: Secondary | ICD-10-CM | POA: Diagnosis not present

## 2014-12-01 DIAGNOSIS — R278 Other lack of coordination: Secondary | ICD-10-CM | POA: Diagnosis not present

## 2014-12-01 DIAGNOSIS — R42 Dizziness and giddiness: Secondary | ICD-10-CM | POA: Diagnosis not present

## 2014-12-01 DIAGNOSIS — R296 Repeated falls: Secondary | ICD-10-CM | POA: Diagnosis not present

## 2014-12-01 NOTE — Therapy (Signed)
[facility] [place], [place], [address] Phone: [phone]   Fax:  [phone]  Physical Therapy Treatment  Patient Details  Name: Sarah Boyd MRN: [id number] Date of Birth: 1930/02/18 Referring Provider:  Chevis Pretty, *  Encounter Date: 12/01/2014      PT End of Session - 12/01/14 1305    Visit Number 2   Number of Visits 17   Date for PT Re-Evaluation 01/26/15   PT Start Time 1302   PT Stop Time 1345   PT Time Calculation (min) 43 min   Equipment Utilized During Treatment Gait belt;Other (comment)  FWW   Activity Tolerance Patient tolerated treatment well   Behavior During Therapy [facility] for tasks assessed/performed      Past Medical History  Diagnosis Date  . Labral tear of shoulder RIGHT SHOULDER  . Asthma   . Hypothyroidism   . History of breast cancer S/P RIGHT MASTECTOMY AND CHEMO 21 YRS AGO (APPROX.  1990)    NO RECURRENCE  . Right shoulder pain   . HOH (hard of hearing) NO AIDS    Past Surgical History  Procedure Laterality Date  . Excision of right chest mass  12-19-1999    BENIGN  . Mastectomy  1990  (APPROX.)    RIGHT BREAST CANCER -- NO RECURRENCE  . Orif bilateral wrist  2009  . Cataract extraction, bilateral    . Shoulder arthroscopy  12/06/2011    Procedure: ARTHROSCOPY SHOULDER;  Surgeon: Magnus Sinning, MD;  [facility];  Service: Orthopedics;  Laterality: Right;  WITH LABRIAL DEBRIDEMENT AND SAD INTERSCALINE BLOCK    There were no vitals filed for this visit.  Visit Diagnosis:  Abnormality of gait  Coordination abnormal  Dizziness and giddiness  Gait disorder  Recurrent falls      Subjective Assessment - 12/01/14 1305    Subjective Reports no pain today in pelvis or R shoulder that she states has arthritis in it.   Limitations Walking   Currently in Pain? No/denies            [facility] PT Assessment - 12/01/14 0001    Assessment    Medical Diagnosis gait disorder, recurrent falls, dizzines and giddines   Onset Date/Surgical Date 09/14/14  Date of last hospitalization for pelvic fracture d/t fall   Prior Therapy D/C'ed from OP neuro in February 2016                     [facility] Adult PT Treatment/Exercise - 12/01/14 0001    Knee/Hip Exercises: Aerobic   Stationary Bike NuStep L4 x12 min   Knee/Hip Exercises: Standing   Forward Step Up Both;3 sets;10 reps;Hand Hold: 2;Step Height: 6"   Other Standing Knee Exercises B standing hip abduction yellow theraband 3x10 reps             Balance Exercises - 12/01/14 1353    Balance Exercises: Standing   Standing Eyes Closed Wide (BOA);Foam/compliant surface;Other (comment);Narrow base of support (BOS)  x3 min ea; 1 UE support;    Marching Limitations on airex x3 min   Other Standing Exercises Toe taps 6" step x20 reps   Overall Comments Other (comment);Limitations  Dynamic sitting on dynadisc, reaching             PT Short Term Goals - 11/27/14 2224    PT SHORT TERM GOAL #1   Title Pt will perform HEP with mod I using paper handout to maximize  functional gains made in physical therapy. Target: 12/25/14.   Time 4   Period Weeks   Status New   PT SHORT TERM GOAL #2   Title Pt will verbalize understanding of fall prevention strategies to decrease risk of falling within home.  12/25/14.   Time 4   Period Weeks   Status New   PT SHORT TERM GOAL #3   Title Pt will increase Dynamic Gait Index score from 12/24 to 15/24 to demo progress toward significant improvement in dynamic gait stability. 12/25/14.   Time 4   Period Weeks   Status New           PT Long Term Goals - 11/27/14 2228    PT LONG TERM GOAL #1   Title Pt will score 19/24 on Dynamic Gait Index to demonstrate improved gait stability in the presence of external demands. Target: 01/22/15.   PT LONG TERM GOAL #2   Title PT will ambulate x500' over level, indoor surfaces with mod I using  LRAD with effective obstacle negotiation and no gait instability during turns. Target: 01/22/15.   Time 8   Period Weeks   Status New   PT LONG TERM GOAL #3   Title Pt will ambulate x500' over uneven, concrete surfaces with LRAD and mod I to progress toward community ambulation. Target: 01/22/15.   Time 8   Period Weeks   Status New   PT LONG TERM GOAL #4   Title  Pt will increae self-selected gait speed from 2.0 to > or equal to 2.63 ft/sec to reach status as community ambulator. Target: 01/22/15.   Time 8   Period Weeks   Status New               Plan - 12/01/14 1356    Clinical Impression Statement Patient did well in treatment today. Ambulated into therapy gym with FWW. Tolerated exercises well following demonstration and verbal cueing for technique. Completes exercises at slower pace secondary to patient reporting when she gets in a hurry is when she falls. Was encouraged to take her time ambulating, watch her surroundings at home for hazards, and always having her FWW. Denied pain following treatment.   Pt will benefit from skilled therapeutic intervention in order to improve on the following deficits Abnormal gait;Decreased balance;Impaired perceived functional ability;Decreased strength;Decreased coordination;Dizziness   Rehab Potential Good   PT Frequency 2x / week   PT Duration 8 weeks   PT Treatment/Interventions Patient/family education;Therapeutic exercise;Gait training;Balance training;Neuromuscular re-education;Vestibular;ADLs/Self Care Home Management;DME Instruction;Stair training;Therapeutic activities;Functional mobility training   PT Next Visit Plan Continue per PT POC.   PT Home Exercise Plan current balance and therex acts   Consulted and Agree with Plan of Care Patient        Problem List Patient Active Problem List   Diagnosis Date Noted  . Asthma, chronic 12/05/2012  . Osteoporosis 12/05/2012  . Hypothyroidism 12/05/2012    Wynelle Fanny,  PTA 12/01/2014, 2:02 PM  [facility] [address] [place], [place], [address] Phone: [phone]   Fax:  [phone]

## 2014-12-02 DIAGNOSIS — R2689 Other abnormalities of gait and mobility: Secondary | ICD-10-CM | POA: Diagnosis not present

## 2014-12-02 DIAGNOSIS — R296 Repeated falls: Secondary | ICD-10-CM | POA: Diagnosis not present

## 2014-12-02 DIAGNOSIS — R42 Dizziness and giddiness: Secondary | ICD-10-CM | POA: Diagnosis not present

## 2014-12-03 DIAGNOSIS — R2689 Other abnormalities of gait and mobility: Secondary | ICD-10-CM | POA: Diagnosis not present

## 2014-12-03 DIAGNOSIS — R42 Dizziness and giddiness: Secondary | ICD-10-CM | POA: Diagnosis not present

## 2014-12-03 DIAGNOSIS — R296 Repeated falls: Secondary | ICD-10-CM | POA: Diagnosis not present

## 2014-12-04 DIAGNOSIS — R296 Repeated falls: Secondary | ICD-10-CM | POA: Diagnosis not present

## 2014-12-04 DIAGNOSIS — R2689 Other abnormalities of gait and mobility: Secondary | ICD-10-CM | POA: Diagnosis not present

## 2014-12-04 DIAGNOSIS — R42 Dizziness and giddiness: Secondary | ICD-10-CM | POA: Diagnosis not present

## 2014-12-07 DIAGNOSIS — R296 Repeated falls: Secondary | ICD-10-CM | POA: Diagnosis not present

## 2014-12-07 DIAGNOSIS — R42 Dizziness and giddiness: Secondary | ICD-10-CM | POA: Diagnosis not present

## 2014-12-07 DIAGNOSIS — Z961 Presence of intraocular lens: Secondary | ICD-10-CM | POA: Diagnosis not present

## 2014-12-07 DIAGNOSIS — H26491 Other secondary cataract, right eye: Secondary | ICD-10-CM | POA: Diagnosis not present

## 2014-12-07 DIAGNOSIS — H43813 Vitreous degeneration, bilateral: Secondary | ICD-10-CM | POA: Diagnosis not present

## 2014-12-07 DIAGNOSIS — H04121 Dry eye syndrome of right lacrimal gland: Secondary | ICD-10-CM | POA: Diagnosis not present

## 2014-12-07 DIAGNOSIS — R2689 Other abnormalities of gait and mobility: Secondary | ICD-10-CM | POA: Diagnosis not present

## 2014-12-08 ENCOUNTER — Ambulatory Visit: Payer: Commercial Managed Care - HMO | Admitting: Physical Therapy

## 2014-12-08 ENCOUNTER — Encounter: Payer: Self-pay | Admitting: Physical Therapy

## 2014-12-08 DIAGNOSIS — R296 Repeated falls: Secondary | ICD-10-CM

## 2014-12-08 DIAGNOSIS — R42 Dizziness and giddiness: Secondary | ICD-10-CM

## 2014-12-08 DIAGNOSIS — R278 Other lack of coordination: Secondary | ICD-10-CM

## 2014-12-08 DIAGNOSIS — R269 Unspecified abnormalities of gait and mobility: Secondary | ICD-10-CM

## 2014-12-08 DIAGNOSIS — R2689 Other abnormalities of gait and mobility: Secondary | ICD-10-CM | POA: Diagnosis not present

## 2014-12-08 NOTE — Patient Instructions (Signed)
With Support   Stand on one leg in neutral spine holding support. With eyes open and holding onto a stable support. Hold __2__ seconds. Repeat on other leg. Do __10__ repetitions, ___2_ sets.   Strengthening: Hip Abductor - Resisted   May perform in bed or sitting in chair :With band looped around both legs above knees, push thighs apart. Repeat _30___ times per set. Do __1__ sets per session. Do _2___ sessions per day.  Bridging   IN BED: Slowly raise buttocks from floor, keeping stomach tight. Repeat __10__ times per set. Do __2__ sets per session. Do __2__ sessions per day.

## 2014-12-08 NOTE — Therapy (Signed)
[facility] [place], [place], [address] Phone: [phone]   Fax:  [phone]  Physical Therapy Treatment  Patient Details  Name: Sarah Boyd MRN: [id number] Date of Birth: 12/03/1929 Referring Provider:  Chevis Pretty, *  Encounter Date: 12/08/2014      PT End of Session - 12/08/14 1038    Visit Number 3   Number of Visits 17   Date for PT Re-Evaluation 01/26/15   PT Start Time 0954   PT Stop Time 1034   PT Time Calculation (min) 40 min   Activity Tolerance Patient tolerated treatment well   Behavior During Therapy [facility] for tasks assessed/performed      Past Medical History  Diagnosis Date  . Labral tear of shoulder RIGHT SHOULDER  . Asthma   . Hypothyroidism   . History of breast cancer S/P RIGHT MASTECTOMY AND CHEMO 21 YRS AGO (APPROX.  1990)    NO RECURRENCE  . Right shoulder pain   . HOH (hard of hearing) NO AIDS    Past Surgical History  Procedure Laterality Date  . Excision of right chest mass  12-19-1999    BENIGN  . Mastectomy  1990  (APPROX.)    RIGHT BREAST CANCER -- NO RECURRENCE  . Orif bilateral wrist  2009  . Cataract extraction, bilateral    . Shoulder arthroscopy  12/06/2011    Procedure: ARTHROSCOPY SHOULDER;  Surgeon: Magnus Sinning, MD;  Location: [facility];  Service: Orthopedics;  Laterality: Right;  WITH LABRIAL DEBRIDEMENT AND SAD INTERSCALINE BLOCK    There were no vitals filed for this visit.  Visit Diagnosis:  Abnormality of gait  Coordination abnormal  Dizziness and giddiness  Gait disorder  Recurrent falls      Subjective Assessment - 12/08/14 1003    Subjective Reports no pain today in pelvis or R shoulder that she states has arthritis in it.   Limitations Walking   Currently in Pain? No/denies                         [facility] Adult PT Treatment/Exercise - 12/08/14 0001    Knee/Hip Exercises: Aerobic   Stationary  Bike NuStep L4 x15 min   Knee/Hip Exercises: Standing   Forward Step Up Both;3 sets;10 reps;Hand Hold: 2;Step Height: 6"   Other Standing Knee Exercises --   Knee/Hip Exercises: Seated   Other Seated Knee/Hip Exercises hip abd with red t-band x30   Knee/Hip Exercises: Supine   Bridges Strengthening;2 sets;10 reps             Balance Exercises - 12/08/14 1009    Balance Exercises: Standing   Rockerboard Anterior/posterior;Other time (comment);UE support  5min   Marching Limitations on airex x3 min   Other Standing Exercises knee bend 2x10   Balance Exercises: Seated   Other Seated Exercises sit to stand x10           PT Education - 12/08/14 1021    Education provided Yes   Education Details HEP   Person(s) Educated Patient   Methods Explanation;Demonstration;Handout   Comprehension Verbalized understanding;Returned demonstration          PT Short Term Goals - 12/08/14 1042    PT SHORT TERM GOAL #1   Title Pt will perform HEP with mod I using paper handout to maximize functional gains made in physical therapy. Target: 12/25/14.   Time 4   Period Weeks   Status On-going  PT SHORT TERM GOAL #2   Title Pt will verbalize understanding of fall prevention strategies to decrease risk of falling within home.  12/25/14.   Time 4   Period Weeks   Status On-going   PT SHORT TERM GOAL #3   Title Pt will increase Dynamic Gait Index score from 12/24 to 15/24 to demo progress toward significant improvement in dynamic gait stability. 12/25/14.   Time 4   Period Weeks   Status On-going           PT Long Term Goals - 11/27/14 2228    PT LONG TERM GOAL #1   Title Pt will score 19/24 on Dynamic Gait Index to demonstrate improved gait stability in the presence of external demands. Target: 01/22/15.   PT LONG TERM GOAL #2   Title PT will ambulate x500' over level, indoor surfaces with mod I using LRAD with effective obstacle negotiation and no gait instability during turns.  Target: 01/22/15.   Time 8   Period Weeks   Status New   PT LONG TERM GOAL #3   Title Pt will ambulate x500' over uneven, concrete surfaces with LRAD and mod I to progress toward community ambulation. Target: 01/22/15.   Time 8   Period Weeks   Status New   PT LONG TERM GOAL #4   Title  Pt will increae self-selected gait speed from 2.0 to > or equal to 2.63 ft/sec to reach status as community ambulator. Target: 01/22/15.   Time 8   Period Weeks   Status New               Plan - 12/08/14 1040    Clinical Impression Statement Patient tolerated treatment well today, had no reports of pain. HEP given for balance and strengthening. Advised patient to be safe with exercises and perform in bed or with UE support. Unable to meet any STG's today due to unstabiliy and only recieved HEP.   Pt will benefit from skilled therapeutic intervention in order to improve on the following deficits Abnormal gait;Decreased balance;Impaired perceived functional ability;Decreased strength;Decreased coordination;Dizziness   Rehab Potential Good   PT Frequency 2x / week   PT Duration 8 weeks   PT Treatment/Interventions Patient/family education;Therapeutic exercise;Gait training;Balance training;Neuromuscular re-education;Vestibular;ADLs/Self Care Home Management;DME Instruction;Stair training;Therapeutic activities;Functional mobility training   PT Next Visit Plan Continue per PT POC.   Consulted and Agree with Plan of Care Patient        Problem List Patient Active Problem List   Diagnosis Date Noted  . Asthma, chronic 12/05/2012  . Osteoporosis 12/05/2012  . Hypothyroidism 12/05/2012    Phillips Climes, PTA 12/08/2014, 10:43 AM  [facility] [place], [place], [address] Phone: [phone]   Fax:  [phone]

## 2014-12-09 DIAGNOSIS — R2689 Other abnormalities of gait and mobility: Secondary | ICD-10-CM | POA: Diagnosis not present

## 2014-12-09 DIAGNOSIS — R42 Dizziness and giddiness: Secondary | ICD-10-CM | POA: Diagnosis not present

## 2014-12-09 DIAGNOSIS — R296 Repeated falls: Secondary | ICD-10-CM | POA: Diagnosis not present

## 2014-12-10 DIAGNOSIS — R2689 Other abnormalities of gait and mobility: Secondary | ICD-10-CM | POA: Diagnosis not present

## 2014-12-10 DIAGNOSIS — R42 Dizziness and giddiness: Secondary | ICD-10-CM | POA: Diagnosis not present

## 2014-12-10 DIAGNOSIS — R296 Repeated falls: Secondary | ICD-10-CM | POA: Diagnosis not present

## 2014-12-11 DIAGNOSIS — R296 Repeated falls: Secondary | ICD-10-CM | POA: Diagnosis not present

## 2014-12-11 DIAGNOSIS — R2689 Other abnormalities of gait and mobility: Secondary | ICD-10-CM | POA: Diagnosis not present

## 2014-12-11 DIAGNOSIS — R42 Dizziness and giddiness: Secondary | ICD-10-CM | POA: Diagnosis not present

## 2014-12-12 ENCOUNTER — Other Ambulatory Visit: Payer: Self-pay | Admitting: Nurse Practitioner

## 2014-12-14 DIAGNOSIS — R42 Dizziness and giddiness: Secondary | ICD-10-CM | POA: Diagnosis not present

## 2014-12-14 DIAGNOSIS — R2689 Other abnormalities of gait and mobility: Secondary | ICD-10-CM | POA: Diagnosis not present

## 2014-12-14 DIAGNOSIS — R296 Repeated falls: Secondary | ICD-10-CM | POA: Diagnosis not present

## 2014-12-15 ENCOUNTER — Ambulatory Visit: Payer: Commercial Managed Care - HMO | Attending: Neurology | Admitting: Physical Therapy

## 2014-12-15 ENCOUNTER — Encounter: Payer: Self-pay | Admitting: Physical Therapy

## 2014-12-15 DIAGNOSIS — R278 Other lack of coordination: Secondary | ICD-10-CM | POA: Diagnosis not present

## 2014-12-15 DIAGNOSIS — R296 Repeated falls: Secondary | ICD-10-CM | POA: Insufficient documentation

## 2014-12-15 DIAGNOSIS — R269 Unspecified abnormalities of gait and mobility: Secondary | ICD-10-CM | POA: Diagnosis not present

## 2014-12-15 DIAGNOSIS — R42 Dizziness and giddiness: Secondary | ICD-10-CM | POA: Insufficient documentation

## 2014-12-15 DIAGNOSIS — R2689 Other abnormalities of gait and mobility: Secondary | ICD-10-CM | POA: Diagnosis not present

## 2014-12-15 NOTE — Therapy (Signed)
[facility] [place], [place], [address] Phone: [phone]   Fax:  [phone]  Physical Therapy Treatment  Patient Details  Name: Sarah Boyd MRN: [id number] Date of Birth: [birth year] Referring Provider:  Chevis Pretty, *  Encounter Date: 12/15/2014      PT End of Session - 12/15/14 1021    Visit Number 4   Number of Visits 17   Date for PT Re-Evaluation 01/26/15   PT Start Time 0951   PT Stop Time 1030   PT Time Calculation (min) 39 min   Activity Tolerance Patient tolerated treatment well   Behavior During Therapy [facility] for tasks assessed/performed      Past Medical History  Diagnosis Date  . Labral tear of shoulder RIGHT SHOULDER  . Asthma   . Hypothyroidism   . History of breast cancer S/P RIGHT MASTECTOMY AND CHEMO 21 YRS AGO (APPROX.  1990)    NO RECURRENCE  . Right shoulder pain   . HOH (hard of hearing) NO AIDS    Past Surgical History  Procedure Laterality Date  . Excision of right chest mass  12-19-1999    BENIGN  . Mastectomy  1990  (APPROX.)    RIGHT BREAST CANCER -- NO RECURRENCE  . Orif bilateral wrist  2009  . Cataract extraction, bilateral    . Shoulder arthroscopy  12/06/2011    Procedure: ARTHROSCOPY SHOULDER;  Surgeon: Magnus Sinning, MD;  Location: [facility];  Service: Orthopedics;  Laterality: Right;  WITH LABRIAL DEBRIDEMENT AND SAD INTERSCALINE BLOCK    There were no vitals filed for this visit.  Visit Diagnosis:  Abnormality of gait  Coordination abnormal  Dizziness and giddiness  Gait disorder  Recurrent falls      Subjective Assessment - 12/15/14 0953    Subjective arrived in [place] today, no falls reported   Limitations Walking   Currently in Pain? No/denies            [facility] PT Assessment - 12/15/14 0001    Dynamic Gait Index   Level Surface Mild Impairment   Change in Gait Speed Normal   Gait with Horizontal Head Turns Normal    Gait with Vertical Head Turns Normal   Gait and Pivot Turn Mild Impairment   Step Over Obstacle Mild Impairment   Step Around Obstacles Moderate Impairment   Steps Mild Impairment   Total Score 18                     [facility] Adult PT Treatment/Exercise - 12/15/14 0001    Knee/Hip Exercises: Aerobic   Stationary Bike NuStep L4 x15 min   Knee/Hip Exercises: Standing   Forward Step Up Both;3 sets;10 reps;Hand Hold: 2;Step Height: 6"   Other Standing Knee Exercises standing marching 2x10 with UE support   Knee/Hip Exercises: Seated   Other Seated Knee/Hip Exercises hip abd with red t-band x30   Other Seated Knee/Hip Exercises sit to stand x10 with UE support   Knee/Hip Exercises: Supine   Bridges Strengthening;2 sets;10 reps                  PT Short Term Goals - 12/15/14 1022    PT SHORT TERM GOAL #1   Title Pt will perform HEP with mod I using paper handout to maximize functional gains made in physical therapy. Target: 12/25/14.   Time 4   Period Weeks   Status Achieved   PT SHORT TERM GOAL #  2   Title Pt will verbalize understanding of fall prevention strategies to decrease risk of falling within home.  12/25/14.   Time 4   Period Weeks   Status On-going   PT SHORT TERM GOAL #3   Title Pt will increase Dynamic Gait Index score from 12/24 to 15/24 to demo progress toward significant improvement in dynamic gait stability. 12/25/14.   Time 4   Period Weeks   Status Achieved  18/24           PT Long Term Goals - 11/27/14 2228    PT LONG TERM GOAL #1   Title Pt will score 19/24 on Dynamic Gait Index to demonstrate improved gait stability in the presence of external demands. Target: 01/22/15.   PT LONG TERM GOAL #2   Title PT will ambulate x500' over level, indoor surfaces with mod I using LRAD with effective obstacle negotiation and no gait instability during turns. Target: 01/22/15.   Time 8   Period Weeks   Status New   PT LONG TERM GOAL #3   Title Pt  will ambulate x500' over uneven, concrete surfaces with LRAD and mod I to progress toward community ambulation. Target: 01/22/15.   Time 8   Period Weeks   Status New   PT LONG TERM GOAL #4   Title  Pt will increae self-selected gait speed from 2.0 to > or equal to 2.63 ft/sec to reach status as community ambulator. Target: 01/22/15.   Time 8   Period Weeks   Status New               Plan - 12/15/14 1024    Clinical Impression Statement Patient continues to progress with all activities and has reported no falls. Has good understanding of initial HEP and able to perform independently with hand out today. Patient has improved with DGI score today to 18/24. Patient able to ambulate independently with FWW and was able to ambulate at various speeds and look left to right, up and down with safe movement and pace, some difficulty with ambulating around objects. Met STG #1 and STG #3, other goals ongoing due to balance and gait deficits.   Pt will benefit from skilled therapeutic intervention in order to improve on the following deficits Abnormal gait;Decreased balance;Impaired perceived functional ability;Decreased strength;Decreased coordination;Dizziness   Rehab Potential Good   PT Frequency 2x / week   PT Duration 8 weeks   PT Treatment/Interventions Patient/family education;Therapeutic exercise;Gait training;Balance training;Neuromuscular re-education;Vestibular;ADLs/Self Care Home Management;DME Instruction;Stair training;Therapeutic activities;Functional mobility training   PT Next Visit Plan Continue per PT POC for balance and gait activities.   Consulted and Agree with Plan of Care Patient        Problem List Patient Active Problem List   Diagnosis Date Noted  . Asthma, chronic 12/05/2012  . Osteoporosis 12/05/2012  . Hypothyroidism 12/05/2012    Amylynn Fano P, PTA 12/15/2014, 10:40 AM  [facility] [address] [place], [place], [address] Phone: [phone]   Fax:  [phone]

## 2014-12-16 DIAGNOSIS — R296 Repeated falls: Secondary | ICD-10-CM | POA: Diagnosis not present

## 2014-12-16 DIAGNOSIS — R2689 Other abnormalities of gait and mobility: Secondary | ICD-10-CM | POA: Diagnosis not present

## 2014-12-16 DIAGNOSIS — R42 Dizziness and giddiness: Secondary | ICD-10-CM | POA: Diagnosis not present

## 2014-12-17 DIAGNOSIS — R42 Dizziness and giddiness: Secondary | ICD-10-CM | POA: Diagnosis not present

## 2014-12-17 DIAGNOSIS — R2689 Other abnormalities of gait and mobility: Secondary | ICD-10-CM | POA: Diagnosis not present

## 2014-12-17 DIAGNOSIS — R296 Repeated falls: Secondary | ICD-10-CM | POA: Diagnosis not present

## 2014-12-18 DIAGNOSIS — R296 Repeated falls: Secondary | ICD-10-CM | POA: Diagnosis not present

## 2014-12-18 DIAGNOSIS — R2689 Other abnormalities of gait and mobility: Secondary | ICD-10-CM | POA: Diagnosis not present

## 2014-12-18 DIAGNOSIS — R42 Dizziness and giddiness: Secondary | ICD-10-CM | POA: Diagnosis not present

## 2014-12-21 DIAGNOSIS — R2689 Other abnormalities of gait and mobility: Secondary | ICD-10-CM | POA: Diagnosis not present

## 2014-12-21 DIAGNOSIS — R42 Dizziness and giddiness: Secondary | ICD-10-CM | POA: Diagnosis not present

## 2014-12-21 DIAGNOSIS — R296 Repeated falls: Secondary | ICD-10-CM | POA: Diagnosis not present

## 2014-12-22 ENCOUNTER — Ambulatory Visit: Payer: Commercial Managed Care - HMO | Admitting: Physical Therapy

## 2014-12-22 ENCOUNTER — Encounter: Payer: Self-pay | Admitting: Physical Therapy

## 2014-12-22 DIAGNOSIS — R269 Unspecified abnormalities of gait and mobility: Secondary | ICD-10-CM | POA: Diagnosis not present

## 2014-12-22 DIAGNOSIS — R296 Repeated falls: Secondary | ICD-10-CM

## 2014-12-22 DIAGNOSIS — R278 Other lack of coordination: Secondary | ICD-10-CM

## 2014-12-22 DIAGNOSIS — R42 Dizziness and giddiness: Secondary | ICD-10-CM | POA: Diagnosis not present

## 2014-12-22 DIAGNOSIS — R2689 Other abnormalities of gait and mobility: Secondary | ICD-10-CM | POA: Diagnosis not present

## 2014-12-22 NOTE — Therapy (Signed)
[facility] [place], [place], [address] Phone: [phone]   Fax:  [phone]  Physical Therapy Treatment  Patient Details  Name: Sarah Boyd MRN: [id number] Date of Birth: 12/06/1929 Referring Provider:  Chevis Pretty, *  Encounter Date: 12/22/2014      PT End of Session - 12/22/14 1102    Visit Number 5   Number of Visits 17   Date for PT Re-Evaluation 01/26/15   PT Start Time 0240   PT Stop Time 1115   PT Time Calculation (min) 39 min   Activity Tolerance Patient tolerated treatment well   Behavior During Therapy [facility] for tasks assessed/performed      Past Medical History  Diagnosis Date  . Labral tear of shoulder RIGHT SHOULDER  . Asthma   . Hypothyroidism   . History of breast cancer S/P RIGHT MASTECTOMY AND CHEMO 21 YRS AGO (APPROX.  1990)    NO RECURRENCE  . Right shoulder pain   . HOH (hard of hearing) NO AIDS    Past Surgical History  Procedure Laterality Date  . Excision of right chest mass  12-19-1999    BENIGN  . Mastectomy  1990  (APPROX.)    RIGHT BREAST CANCER -- NO RECURRENCE  . Orif bilateral wrist  2009  . Cataract extraction, bilateral    . Shoulder arthroscopy  12/06/2011    Procedure: ARTHROSCOPY SHOULDER;  Surgeon: Magnus Sinning, MD;  Location: [facility];  Service: Orthopedics;  Laterality: Right;  WITH LABRIAL DEBRIDEMENT AND SAD INTERSCALINE BLOCK    There were no vitals filed for this visit.  Visit Diagnosis:  Abnormality of gait  Coordination abnormal  Dizziness and giddiness  Gait disorder  Recurrent falls      Subjective Assessment - 12/22/14 1038    Subjective arrived in [place] today, no falls reported   Limitations Walking   Currently in Pain? No/denies                         [facility] Adult PT Treatment/Exercise - 12/22/14 0001    Knee/Hip Exercises: Aerobic   Stationary Bike NuStep L4 x15 min   Knee/Hip Exercises:  Standing   Forward Step Up Both;3 sets;10 reps;Hand Hold: 2;Step Height: 6"             Balance Exercises - 12/22/14 1057    Balance Exercises: Standing   Rockerboard Anterior/posterior;Other time (comment);UE support  98min   Retro Gait Upper extremity support;5 reps   Sidestepping Upper extremity support;5 reps   Step Over Hurdles / Cones 30 feet x 4 over cones   Cone Rotation Solid surface;Right turn;Left turn  used FWW going around cones    Marching Limitations on airex x3 min             PT Short Term Goals - 12/15/14 1022    PT SHORT TERM GOAL #1   Title Pt will perform HEP with mod I using paper handout to maximize functional gains made in physical therapy. Target: 12/25/14.   Time 4   Period Weeks   Status Achieved   PT SHORT TERM GOAL #2   Title Pt will verbalize understanding of fall prevention strategies to decrease risk of falling within home.  12/25/14.   Time 4   Period Weeks   Status On-going   PT SHORT TERM GOAL #3   Title Pt will increase Dynamic Gait Index score from 12/24 to 15/24 to  demo progress toward significant improvement in dynamic gait stability. 12/25/14.   Time 4   Period Weeks   Status Achieved  18/24           PT Long Term Goals - 11/27/14 2228    PT LONG TERM GOAL #1   Title Pt will score 19/24 on Dynamic Gait Index to demonstrate improved gait stability in the presence of external demands. Target: 01/22/15.   PT LONG TERM GOAL #2   Title PT will ambulate x500' over level, indoor surfaces with mod I using LRAD with effective obstacle negotiation and no gait instability during turns. Target: 01/22/15.   Time 8   Period Weeks   Status New   PT LONG TERM GOAL #3   Title Pt will ambulate x500' over uneven, concrete surfaces with LRAD and mod I to progress toward community ambulation. Target: 01/22/15.   Time 8   Period Weeks   Status New   PT LONG TERM GOAL #4   Title  Pt will increae self-selected gait speed from 2.0 to > or equal  to 2.63 ft/sec to reach status as community ambulator. Target: 01/22/15.   Time 8   Period Weeks   Status New               Plan - 12/22/14 1102    Clinical Impression Statement Patient continues to improve with all activities and progressing with balance activites. Patient has reported no falls. Patient is able to ambulate at a safe pace today with FWW. Unable to meet any further goals due balance and gait deficits.   Pt will benefit from skilled therapeutic intervention in order to improve on the following deficits Abnormal gait;Decreased balance;Impaired perceived functional ability;Decreased strength;Decreased coordination;Dizziness   Rehab Potential Good   PT Frequency 2x / week   PT Duration 8 weeks   PT Treatment/Interventions Patient/family education;Therapeutic exercise;Gait training;Balance training;Neuromuscular re-education;Vestibular;ADLs/Self Care Home Management;DME Instruction;Stair training;Therapeutic activities;Functional mobility training   PT Next Visit Plan Continue per PT POC for balance and gait activities.   Consulted and Agree with Plan of Care Patient        Problem List Patient Active Problem List   Diagnosis Date Noted  . Asthma, chronic 12/05/2012  . Osteoporosis 12/05/2012  . Hypothyroidism 12/05/2012    Teya Otterson P, PTA 12/22/2014, 11:15 AM  [facility] [address] [place], [place], [address] Phone: [phone]   Fax:  [phone]

## 2014-12-23 DIAGNOSIS — R2689 Other abnormalities of gait and mobility: Secondary | ICD-10-CM | POA: Diagnosis not present

## 2014-12-23 DIAGNOSIS — R296 Repeated falls: Secondary | ICD-10-CM | POA: Diagnosis not present

## 2014-12-23 DIAGNOSIS — R42 Dizziness and giddiness: Secondary | ICD-10-CM | POA: Diagnosis not present

## 2014-12-24 DIAGNOSIS — R2689 Other abnormalities of gait and mobility: Secondary | ICD-10-CM | POA: Diagnosis not present

## 2014-12-24 DIAGNOSIS — R42 Dizziness and giddiness: Secondary | ICD-10-CM | POA: Diagnosis not present

## 2014-12-24 DIAGNOSIS — R296 Repeated falls: Secondary | ICD-10-CM | POA: Diagnosis not present

## 2014-12-25 DIAGNOSIS — R2689 Other abnormalities of gait and mobility: Secondary | ICD-10-CM | POA: Diagnosis not present

## 2014-12-25 DIAGNOSIS — R296 Repeated falls: Secondary | ICD-10-CM | POA: Diagnosis not present

## 2014-12-25 DIAGNOSIS — R42 Dizziness and giddiness: Secondary | ICD-10-CM | POA: Diagnosis not present

## 2014-12-28 DIAGNOSIS — R42 Dizziness and giddiness: Secondary | ICD-10-CM | POA: Diagnosis not present

## 2014-12-28 DIAGNOSIS — R2689 Other abnormalities of gait and mobility: Secondary | ICD-10-CM | POA: Diagnosis not present

## 2014-12-28 DIAGNOSIS — R296 Repeated falls: Secondary | ICD-10-CM | POA: Diagnosis not present

## 2014-12-29 DIAGNOSIS — R42 Dizziness and giddiness: Secondary | ICD-10-CM | POA: Diagnosis not present

## 2014-12-29 DIAGNOSIS — R2689 Other abnormalities of gait and mobility: Secondary | ICD-10-CM | POA: Diagnosis not present

## 2014-12-29 DIAGNOSIS — R296 Repeated falls: Secondary | ICD-10-CM | POA: Diagnosis not present

## 2014-12-30 ENCOUNTER — Ambulatory Visit: Payer: Commercial Managed Care - HMO | Admitting: Physical Therapy

## 2014-12-30 ENCOUNTER — Encounter: Payer: Self-pay | Admitting: Physical Therapy

## 2014-12-30 DIAGNOSIS — R42 Dizziness and giddiness: Secondary | ICD-10-CM | POA: Diagnosis not present

## 2014-12-30 DIAGNOSIS — R2689 Other abnormalities of gait and mobility: Secondary | ICD-10-CM | POA: Diagnosis not present

## 2014-12-30 DIAGNOSIS — R278 Other lack of coordination: Secondary | ICD-10-CM

## 2014-12-30 DIAGNOSIS — R296 Repeated falls: Secondary | ICD-10-CM

## 2014-12-30 DIAGNOSIS — R269 Unspecified abnormalities of gait and mobility: Secondary | ICD-10-CM

## 2014-12-30 NOTE — Therapy (Signed)
[facility] [place], [place], [address] Phone: [phone]   Fax:  [phone]  Physical Therapy Treatment  Patient Details  Name: Sarah Boyd MRN: [id number] Date of Birth: November 30, 1929 Referring Provider:  Chevis Pretty, *  Encounter Date: 12/30/2014      PT End of Session - 12/30/14 1037    Visit Number 6   Number of Visits 17   Date for PT Re-Evaluation 01/26/15   PT Start Time 4259   PT Stop Time 1117   PT Time Calculation (min) 45 min   Equipment Utilized During Treatment Gait belt;Other (comment)   Activity Tolerance Patient tolerated treatment well   Behavior During Therapy [facility] for tasks assessed/performed      Past Medical History  Diagnosis Date  . Labral tear of shoulder RIGHT SHOULDER  . Asthma   . Hypothyroidism   . History of breast cancer S/P RIGHT MASTECTOMY AND CHEMO 21 YRS AGO (APPROX.  1990)    NO RECURRENCE  . Right shoulder pain   . HOH (hard of hearing) NO AIDS    Past Surgical History  Procedure Laterality Date  . Excision of right chest mass  12-19-1999    BENIGN  . Mastectomy  1990  (APPROX.)    RIGHT BREAST CANCER -- NO RECURRENCE  . Orif bilateral wrist  2009  . Cataract extraction, bilateral    . Shoulder arthroscopy  12/06/2011    Procedure: ARTHROSCOPY SHOULDER;  Surgeon: Magnus Sinning, MD;  Location: [facility];  Service: Orthopedics;  Laterality: Right;  WITH LABRIAL DEBRIDEMENT AND SAD INTERSCALINE BLOCK    There were no vitals filed for this visit.  Visit Diagnosis:  Abnormality of gait  Coordination abnormal  Dizziness and giddiness  Gait disorder  Recurrent falls      Subjective Assessment - 12/30/14 1037    Subjective Reports feeling okay and no falls.   Limitations Walking   Currently in Pain? No/denies            [facility] PT Assessment - 12/30/14 0001    Assessment   Medical Diagnosis gait disorder, recurrent falls,  dizzines and giddines   Onset Date/Surgical Date 09/14/14   Prior Therapy D/C'ed from OP neuro in February 2016   Precautions   Precautions Fall                     [facility] Adult PT Treatment/Exercise - 12/30/14 0001    Knee/Hip Exercises: Aerobic   Stationary Bike NuStep L4 x15 min   Knee/Hip Exercises: Standing   Lateral Step Up Both;3 sets;10 reps;Hand Hold: 2;Step Height: 6"   Forward Step Up Both;3 sets;10 reps;Hand Hold: 2;Step Height: 6"             Balance Exercises - 12/30/14 1123    Balance Exercises: Standing   Rockerboard Lateral  x3 min   Retro Gait Upper extremity support;3 reps   Sidestepping Upper extremity support;3 reps   Step Over Hurdles / Cones x5 cones, 30 feet x5 RT   Marching Limitations on airex x3 min   Other Standing Exercises DLS 1 UE support on Bosu x5 min with A/P, L/R weighshifting, balance             PT Short Term Goals - 12/15/14 1022    PT SHORT TERM GOAL #1   Title Pt will perform HEP with mod I using paper handout to maximize functional gains made in physical therapy. Target: 12/25/14.  Time 4   Period Weeks   Status Achieved   PT SHORT TERM GOAL #2   Title Pt will verbalize understanding of fall prevention strategies to decrease risk of falling within home.  12/25/14.   Time 4   Period Weeks   Status On-going   PT SHORT TERM GOAL #3   Title Pt will increase Dynamic Gait Index score from 12/24 to 15/24 to demo progress toward significant improvement in dynamic gait stability. 12/25/14.   Time 4   Period Weeks   Status Achieved  18/24           PT Long Term Goals - 11/27/14 2228    PT LONG TERM GOAL #1   Title Pt will score 19/24 on Dynamic Gait Index to demonstrate improved gait stability in the presence of external demands. Target: 01/22/15.   PT LONG TERM GOAL #2   Title PT will ambulate x500' over level, indoor surfaces with mod I using LRAD with effective obstacle negotiation and no gait instability  during turns. Target: 01/22/15.   Time 8   Period Weeks   Status New   PT LONG TERM GOAL #3   Title Pt will ambulate x500' over uneven, concrete surfaces with LRAD and mod I to progress toward community ambulation. Target: 01/22/15.   Time 8   Period Weeks   Status New   PT LONG TERM GOAL #4   Title  Pt will increae self-selected gait speed from 2.0 to > or equal to 2.63 ft/sec to reach status as community ambulator. Target: 01/22/15.   Time 8   Period Weeks   Status New               Plan - 12/30/14 1119    Clinical Impression Statement Patient tolerated treatment well today without complaint of pain only fatigue following treatment. Patient ambulates safely with FWW in clinic today. No goals were acheived today in clinic secondary to ambulation and balance deficets.    Pt will benefit from skilled therapeutic intervention in order to improve on the following deficits Abnormal gait;Decreased balance;Impaired perceived functional ability;Decreased strength;Decreased coordination;Dizziness   Rehab Potential Good   PT Frequency 2x / week   PT Duration 8 weeks   PT Treatment/Interventions Patient/family education;Therapeutic exercise;Gait training;Balance training;Neuromuscular re-education;Vestibular;ADLs/Self Care Home Management;DME Instruction;Stair training;Therapeutic activities;Functional mobility training   PT Next Visit Plan Continue per PT POC for balance and gait activities.   PT Home Exercise Plan current balance and therex acts   Consulted and Agree with Plan of Care Patient        Problem List Patient Active Problem List   Diagnosis Date Noted  . Asthma, chronic 12/05/2012  . Osteoporosis 12/05/2012  . Hypothyroidism 12/05/2012    Wynelle Fanny, PTA 12/30/2014, 11:25 AM  [facility] [address] [place], [place], [address] Phone: [phone]   Fax:  [phone]

## 2014-12-31 DIAGNOSIS — R296 Repeated falls: Secondary | ICD-10-CM | POA: Diagnosis not present

## 2014-12-31 DIAGNOSIS — R42 Dizziness and giddiness: Secondary | ICD-10-CM | POA: Diagnosis not present

## 2014-12-31 DIAGNOSIS — R2689 Other abnormalities of gait and mobility: Secondary | ICD-10-CM | POA: Diagnosis not present

## 2015-01-01 DIAGNOSIS — R42 Dizziness and giddiness: Secondary | ICD-10-CM | POA: Diagnosis not present

## 2015-01-01 DIAGNOSIS — R296 Repeated falls: Secondary | ICD-10-CM | POA: Diagnosis not present

## 2015-01-01 DIAGNOSIS — R2689 Other abnormalities of gait and mobility: Secondary | ICD-10-CM | POA: Diagnosis not present

## 2015-01-04 DIAGNOSIS — R296 Repeated falls: Secondary | ICD-10-CM | POA: Diagnosis not present

## 2015-01-04 DIAGNOSIS — R42 Dizziness and giddiness: Secondary | ICD-10-CM | POA: Diagnosis not present

## 2015-01-04 DIAGNOSIS — R2689 Other abnormalities of gait and mobility: Secondary | ICD-10-CM | POA: Diagnosis not present

## 2015-01-05 DIAGNOSIS — R296 Repeated falls: Secondary | ICD-10-CM | POA: Diagnosis not present

## 2015-01-05 DIAGNOSIS — R42 Dizziness and giddiness: Secondary | ICD-10-CM | POA: Diagnosis not present

## 2015-01-05 DIAGNOSIS — R2689 Other abnormalities of gait and mobility: Secondary | ICD-10-CM | POA: Diagnosis not present

## 2015-01-06 DIAGNOSIS — R2689 Other abnormalities of gait and mobility: Secondary | ICD-10-CM | POA: Diagnosis not present

## 2015-01-06 DIAGNOSIS — R42 Dizziness and giddiness: Secondary | ICD-10-CM | POA: Diagnosis not present

## 2015-01-06 DIAGNOSIS — R296 Repeated falls: Secondary | ICD-10-CM | POA: Diagnosis not present

## 2015-01-07 DIAGNOSIS — R42 Dizziness and giddiness: Secondary | ICD-10-CM | POA: Diagnosis not present

## 2015-01-07 DIAGNOSIS — R2689 Other abnormalities of gait and mobility: Secondary | ICD-10-CM | POA: Diagnosis not present

## 2015-01-07 DIAGNOSIS — R296 Repeated falls: Secondary | ICD-10-CM | POA: Diagnosis not present

## 2015-01-08 ENCOUNTER — Ambulatory Visit: Payer: Commercial Managed Care - HMO | Admitting: Physical Therapy

## 2015-01-08 ENCOUNTER — Encounter: Payer: Self-pay | Admitting: Physical Therapy

## 2015-01-08 DIAGNOSIS — R278 Other lack of coordination: Secondary | ICD-10-CM | POA: Diagnosis not present

## 2015-01-08 DIAGNOSIS — R2689 Other abnormalities of gait and mobility: Secondary | ICD-10-CM | POA: Diagnosis not present

## 2015-01-08 DIAGNOSIS — R42 Dizziness and giddiness: Secondary | ICD-10-CM

## 2015-01-08 DIAGNOSIS — R296 Repeated falls: Secondary | ICD-10-CM | POA: Diagnosis not present

## 2015-01-08 DIAGNOSIS — R269 Unspecified abnormalities of gait and mobility: Secondary | ICD-10-CM

## 2015-01-08 NOTE — Therapy (Signed)
[facility] [place], [place], [address] Phone: [phone]   Fax:  [phone]  Physical Therapy Treatment  Patient Details  Name: Sarah Boyd MRN: [id number] Date of Birth: Oct 19, 1929 Referring Provider:  Chevis Pretty, *  Encounter Date: 01/08/2015      PT End of Session - 01/08/15 1034    Visit Number 7   Number of Visits 17   Date for PT Re-Evaluation 01/26/15   PT Start Time 4401   PT Stop Time 1115   PT Time Calculation (min) 43 min   Equipment Utilized During Treatment Gait belt;Other (comment)  FWW   Activity Tolerance Patient tolerated treatment well   Behavior During Therapy [facility] for tasks assessed/performed      Past Medical History  Diagnosis Date  . Labral tear of shoulder RIGHT SHOULDER  . Asthma   . Hypothyroidism   . History of breast cancer S/P RIGHT MASTECTOMY AND CHEMO 21 YRS AGO (APPROX.  1990)    NO RECURRENCE  . Right shoulder pain   . HOH (hard of hearing) NO AIDS    Past Surgical History  Procedure Laterality Date  . Excision of right chest mass  12-19-1999    BENIGN  . Mastectomy  1990  (APPROX.)    RIGHT BREAST CANCER -- NO RECURRENCE  . Orif bilateral wrist  2009  . Cataract extraction, bilateral    . Shoulder arthroscopy  12/06/2011    Procedure: ARTHROSCOPY SHOULDER;  Surgeon: Magnus Sinning, MD;  Location: [facility];  Service: Orthopedics;  Laterality: Right;  WITH LABRIAL DEBRIDEMENT AND SAD INTERSCALINE BLOCK    There were no vitals filed for this visit.  Visit Diagnosis:  Abnormality of gait  Coordination abnormal  Dizziness and giddiness  Gait disorder  Recurrent falls      Subjective Assessment - 01/08/15 1033    Subjective Reports feeling okay today with no new falls.   Limitations Walking   Currently in Pain? No/denies            [facility] PT Assessment - 01/08/15 0001    Assessment   Medical Diagnosis gait disorder,  recurrent falls, dizzines and giddines   Onset Date/Surgical Date 09/14/14   Prior Therapy D/C'ed from OP neuro in February 2016   Precautions   Precautions Fall                     [facility] Adult PT Treatment/Exercise - 01/08/15 0001    Knee/Hip Exercises: Aerobic   Stationary Bike NuStep L5 x12 min   Knee/Hip Exercises: Standing   Lateral Step Up Both;3 sets;10 reps;Hand Hold: 2;Step Height: 6"   Forward Step Up Both;3 sets;10 reps;Hand Hold: 2;Step Height: 6"             Balance Exercises - 01/08/15 1055    Balance Exercises: Standing   Rockerboard Lateral;Intermittent UE support  x3 min   Marching Limitations on airex x3 min   Other Standing Exercises DLS intermittant UE use on inverted BOSU x4 min; Reaching low level on airex BUE use at mid height and low height to pick up cone x6 min             PT Short Term Goals - 12/15/14 1022    PT SHORT TERM GOAL #1   Title Pt will perform HEP with mod I using paper handout to maximize functional gains made in physical therapy. Target: 12/25/14.   Time 4   Period  Weeks   Status Achieved   PT SHORT TERM GOAL #2   Title Pt will verbalize understanding of fall prevention strategies to decrease risk of falling within home.  12/25/14.   Time 4   Period Weeks   Status On-going   PT SHORT TERM GOAL #3   Title Pt will increase Dynamic Gait Index score from 12/24 to 15/24 to demo progress toward significant improvement in dynamic gait stability. 12/25/14.   Time 4   Period Weeks   Status Achieved  18/24           PT Long Term Goals - 11/27/14 2228    PT LONG TERM GOAL #1   Title Pt will score 19/24 on Dynamic Gait Index to demonstrate improved gait stability in the presence of external demands. Target: 01/22/15.   PT LONG TERM GOAL #2   Title PT will ambulate x500' over level, indoor surfaces with mod I using LRAD with effective obstacle negotiation and no gait instability during turns. Target: 01/22/15.   Time 8    Period Weeks   Status New   PT LONG TERM GOAL #3   Title Pt will ambulate x500' over uneven, concrete surfaces with LRAD and mod I to progress toward community ambulation. Target: 01/22/15.   Time 8   Period Weeks   Status New   PT LONG TERM GOAL #4   Title  Pt will increae self-selected gait speed from 2.0 to > or equal to 2.63 ft/sec to reach status as community ambulator. Target: 01/22/15.   Time 8   Period Weeks   Status New               Plan - 01/08/15 1119    Clinical Impression Statement Patient tolerated treatment well today without complaint of pain or of fatigue following treatment. Required CGA for all balance exercises due to instability demonstrated by patient. Did fairly well with reaching/ picking exercise at different heights although she had one LOB in which patient caught herself and PTA was there to guard patient. No goals were achieved today secondary to ambulation and gait deficits. Reported dizzy feeling following therapy in which patient was seated.   Pt will benefit from skilled therapeutic intervention in order to improve on the following deficits Abnormal gait;Decreased balance;Impaired perceived functional ability;Decreased strength;Decreased coordination;Dizziness   Rehab Potential Good   PT Frequency 2x / week   PT Duration 8 weeks   PT Treatment/Interventions Patient/family education;Therapeutic exercise;Gait training;Balance training;Neuromuscular re-education;Vestibular;ADLs/Self Care Home Management;DME Instruction;Stair training;Therapeutic activities;Functional mobility training   PT Next Visit Plan Continue per PT POC for balance and gait activities.   Consulted and Agree with Plan of Care Patient        Problem List Patient Active Problem List   Diagnosis Date Noted  . Asthma, chronic 12/05/2012  . Osteoporosis 12/05/2012  . Hypothyroidism 12/05/2012    Wynelle Fanny, PTA 01/08/2015, 11:23 AM  [facility] [address] [place], [place], [address] Phone: [phone]   Fax:  [phone]

## 2015-01-11 DIAGNOSIS — R42 Dizziness and giddiness: Secondary | ICD-10-CM | POA: Diagnosis not present

## 2015-01-11 DIAGNOSIS — R296 Repeated falls: Secondary | ICD-10-CM | POA: Diagnosis not present

## 2015-01-11 DIAGNOSIS — R2689 Other abnormalities of gait and mobility: Secondary | ICD-10-CM | POA: Diagnosis not present

## 2015-01-12 DIAGNOSIS — R42 Dizziness and giddiness: Secondary | ICD-10-CM | POA: Diagnosis not present

## 2015-01-12 DIAGNOSIS — R2689 Other abnormalities of gait and mobility: Secondary | ICD-10-CM | POA: Diagnosis not present

## 2015-01-12 DIAGNOSIS — R296 Repeated falls: Secondary | ICD-10-CM | POA: Diagnosis not present

## 2015-01-13 DIAGNOSIS — R296 Repeated falls: Secondary | ICD-10-CM | POA: Diagnosis not present

## 2015-01-13 DIAGNOSIS — R42 Dizziness and giddiness: Secondary | ICD-10-CM | POA: Diagnosis not present

## 2015-01-13 DIAGNOSIS — R2689 Other abnormalities of gait and mobility: Secondary | ICD-10-CM | POA: Diagnosis not present

## 2015-01-14 DIAGNOSIS — R296 Repeated falls: Secondary | ICD-10-CM | POA: Diagnosis not present

## 2015-01-14 DIAGNOSIS — R42 Dizziness and giddiness: Secondary | ICD-10-CM | POA: Diagnosis not present

## 2015-01-14 DIAGNOSIS — H26491 Other secondary cataract, right eye: Secondary | ICD-10-CM | POA: Diagnosis not present

## 2015-01-14 DIAGNOSIS — R2689 Other abnormalities of gait and mobility: Secondary | ICD-10-CM | POA: Diagnosis not present

## 2015-01-15 ENCOUNTER — Encounter: Payer: Self-pay | Admitting: Physical Therapy

## 2015-01-15 ENCOUNTER — Ambulatory Visit: Payer: Commercial Managed Care - HMO | Attending: Neurology | Admitting: Physical Therapy

## 2015-01-15 DIAGNOSIS — R296 Repeated falls: Secondary | ICD-10-CM | POA: Diagnosis not present

## 2015-01-15 DIAGNOSIS — R42 Dizziness and giddiness: Secondary | ICD-10-CM | POA: Diagnosis not present

## 2015-01-15 DIAGNOSIS — R2689 Other abnormalities of gait and mobility: Secondary | ICD-10-CM | POA: Diagnosis not present

## 2015-01-15 DIAGNOSIS — R269 Unspecified abnormalities of gait and mobility: Secondary | ICD-10-CM | POA: Diagnosis not present

## 2015-01-15 DIAGNOSIS — R278 Other lack of coordination: Secondary | ICD-10-CM | POA: Diagnosis not present

## 2015-01-15 NOTE — Therapy (Signed)
[facility] [place], [place], [address] Phone: [phone]   Fax:  [phone]  Physical Therapy Treatment  Patient Details  Name: Sarah Boyd MRN: [id number] Date of Birth: 25-Dec-1929 Referring Provider:  Chevis Pretty, *  Encounter Date: 01/15/2015      PT End of Session - 01/15/15 1124    Visit Number 8   Number of Visits 17   Date for PT Re-Evaluation 01/26/15   Authorization Type [employer]   PT Start Time 1123   PT Stop Time 1202   PT Time Calculation (min) 39 min   Equipment Utilized During Treatment Other (comment)  FWW   Activity Tolerance Patient tolerated treatment well   Behavior During Therapy [facility] for tasks assessed/performed      Past Medical History  Diagnosis Date  . Labral tear of shoulder RIGHT SHOULDER  . Asthma   . Hypothyroidism   . History of breast cancer S/P RIGHT MASTECTOMY AND CHEMO 21 YRS AGO (APPROX.  1990)    NO RECURRENCE  . Right shoulder pain   . HOH (hard of hearing) NO AIDS    Past Surgical History  Procedure Laterality Date  . Excision of right chest mass  12-19-1999    BENIGN  . Mastectomy  1990  (APPROX.)    RIGHT BREAST CANCER -- NO RECURRENCE  . Orif bilateral wrist  2009  . Cataract extraction, bilateral    . Shoulder arthroscopy  12/06/2011    Procedure: ARTHROSCOPY SHOULDER;  Surgeon: Magnus Sinning, MD;  Location: [facility];  Service: Orthopedics;  Laterality: Right;  WITH LABRIAL DEBRIDEMENT AND SAD INTERSCALINE BLOCK    There were no vitals filed for this visit.  Visit Diagnosis:  Abnormality of gait  Coordination abnormal  Dizziness and giddiness  Gait disorder  Recurrent falls      Subjective Assessment - 01/15/15 1124    Subjective Reports feeling okay today with no new falls.   Patient is accompained by: Family member   Limitations Walking   Currently in Pain? No/denies            [facility] PT Assessment -  01/15/15 0001    Assessment   Medical Diagnosis gait disorder, recurrent falls, dizzines and giddines   Onset Date/Surgical Date 09/14/14   Prior Therapy D/C'ed from OP neuro in February 2016   Precautions   Precautions Fall                     [facility] Adult PT Treatment/Exercise - 01/15/15 0001    Ambulation/Gait   Ambulation/Gait Yes   Ambulation/Gait Assistance 5: Supervision   Ambulation/Gait Assistance Details Required cueing for decreased speed, erect stance, look at surroundings   Ambulation Distance (Feet) --  Several laps throughout the building with random turns   Assistive device Rollator   Gait Pattern Within Functional Limits;Narrow base of support;Trunk flexed   Ambulation Surface Level;Indoor   Knee/Hip Exercises: Aerobic   Stationary Bike NuStep L5 x14 min             Balance Exercises - 01/15/15 1206    Balance Exercises: Standing   Rockerboard Lateral;Intermittent UE support  x4 min   Marching Limitations on airex x2 min   Other Standing Exercises Reaching across midline with BUE on airex; Reaching with 4 cone pick up from 14" step x4 and from 8" step x4 on airex             PT Short  Term Goals - 12/15/14 1022    PT SHORT TERM GOAL #1   Title Pt will perform HEP with mod I using paper handout to maximize functional gains made in physical therapy. Target: 12/25/14.   Time 4   Period Weeks   Status Achieved   PT SHORT TERM GOAL #2   Title Pt will verbalize understanding of fall prevention strategies to decrease risk of falling within home.  12/25/14.   Time 4   Period Weeks   Status On-going   PT SHORT TERM GOAL #3   Title Pt will increase Dynamic Gait Index score from 12/24 to 15/24 to demo progress toward significant improvement in dynamic gait stability. 12/25/14.   Time 4   Period Weeks   Status Achieved  18/24           PT Long Term Goals - 11/27/14 2228    PT LONG TERM GOAL #1   Title Pt will score 19/24 on Dynamic Gait  Index to demonstrate improved gait stability in the presence of external demands. Target: 01/22/15.   PT LONG TERM GOAL #2   Title PT will ambulate x500' over level, indoor surfaces with mod I using LRAD with effective obstacle negotiation and no gait instability during turns. Target: 01/22/15.   Time 8   Period Weeks   Status New   PT LONG TERM GOAL #3   Title Pt will ambulate x500' over uneven, concrete surfaces with LRAD and mod I to progress toward community ambulation. Target: 01/22/15.   Time 8   Period Weeks   Status New   PT LONG TERM GOAL #4   Title  Pt will increae self-selected gait speed from 2.0 to > or equal to 2.63 ft/sec to reach status as community ambulator. Target: 01/22/15.   Time 8   Period Weeks   Status New               Plan - 01/15/15 1208    Clinical Impression Statement Patient continues to tolerate treatment well today without complaint of pain or fatigue following the treatment. Required CGA for all balance exercises due to instability. Continues to do well with reaching activities as trunk height as well as at low heights with no LOB today in parallel bars. Did well during gait training with rollator walker. Family member present as well as patient stated that patient feels safer in rollator compared to standard walker. DId well with random turns and circles as well as several laps throughout the facility. Required cueing for erect stance, decreased speed (biggest concern for patient's family), and to look at surroundings.    Pt will benefit from skilled therapeutic intervention in order to improve on the following deficits Abnormal gait;Decreased balance;Impaired perceived functional ability;Decreased strength;Decreased coordination;Dizziness   Rehab Potential Good   PT Frequency 2x / week   PT Duration 8 weeks   PT Treatment/Interventions Patient/family education;Therapeutic exercise;Gait training;Balance training;Neuromuscular  re-education;Vestibular;ADLs/Self Care Home Management;DME Instruction;Stair training;Therapeutic activities;Functional mobility training   PT Next Visit Plan Continue per PT POC for balance and gait activities.   PT Home Exercise Plan current balance and therex acts   Consulted and Agree with Plan of Care Patient   Family Member Consulted Niece, Fleta        Problem List Patient Active Problem List   Diagnosis Date Noted  . Asthma, chronic 12/05/2012  . Osteoporosis 12/05/2012  . Hypothyroidism 12/05/2012    Wynelle Fanny, PTA 01/15/2015, 12:14 PM  [facility] [place], [place], [address] Phone: [phone]   Fax:  [phone]

## 2015-01-18 DIAGNOSIS — R2689 Other abnormalities of gait and mobility: Secondary | ICD-10-CM | POA: Diagnosis not present

## 2015-01-18 DIAGNOSIS — R296 Repeated falls: Secondary | ICD-10-CM | POA: Diagnosis not present

## 2015-01-18 DIAGNOSIS — R42 Dizziness and giddiness: Secondary | ICD-10-CM | POA: Diagnosis not present

## 2015-01-19 DIAGNOSIS — R42 Dizziness and giddiness: Secondary | ICD-10-CM | POA: Diagnosis not present

## 2015-01-19 DIAGNOSIS — R2689 Other abnormalities of gait and mobility: Secondary | ICD-10-CM | POA: Diagnosis not present

## 2015-01-19 DIAGNOSIS — R296 Repeated falls: Secondary | ICD-10-CM | POA: Diagnosis not present

## 2015-01-20 DIAGNOSIS — R2689 Other abnormalities of gait and mobility: Secondary | ICD-10-CM | POA: Diagnosis not present

## 2015-01-20 DIAGNOSIS — R42 Dizziness and giddiness: Secondary | ICD-10-CM | POA: Diagnosis not present

## 2015-01-20 DIAGNOSIS — R296 Repeated falls: Secondary | ICD-10-CM | POA: Diagnosis not present

## 2015-01-21 DIAGNOSIS — R296 Repeated falls: Secondary | ICD-10-CM | POA: Diagnosis not present

## 2015-01-21 DIAGNOSIS — R42 Dizziness and giddiness: Secondary | ICD-10-CM | POA: Diagnosis not present

## 2015-01-21 DIAGNOSIS — R2689 Other abnormalities of gait and mobility: Secondary | ICD-10-CM | POA: Diagnosis not present

## 2015-01-22 ENCOUNTER — Ambulatory Visit: Payer: Commercial Managed Care - HMO | Admitting: Physical Therapy

## 2015-01-22 ENCOUNTER — Encounter: Payer: Self-pay | Admitting: Physical Therapy

## 2015-01-22 DIAGNOSIS — R42 Dizziness and giddiness: Secondary | ICD-10-CM | POA: Diagnosis not present

## 2015-01-22 DIAGNOSIS — R296 Repeated falls: Secondary | ICD-10-CM

## 2015-01-22 DIAGNOSIS — R278 Other lack of coordination: Secondary | ICD-10-CM

## 2015-01-22 DIAGNOSIS — R269 Unspecified abnormalities of gait and mobility: Secondary | ICD-10-CM

## 2015-01-22 DIAGNOSIS — R2689 Other abnormalities of gait and mobility: Secondary | ICD-10-CM | POA: Diagnosis not present

## 2015-01-22 NOTE — Therapy (Signed)
[facility] [place], [place], [address] Phone: [phone]   Fax:  [phone]  Physical Therapy Treatment  Patient Details  Name: ANSELMA HERBEL MRN: [id number] Date of Birth: 1929/11/04 Referring Provider:  Chevis Pretty, *  Encounter Date: 01/22/2015      PT End of Session - 01/22/15 1120    Visit Number 9   Number of Visits 17   Date for PT Re-Evaluation 01/26/15   PT Start Time 1115   PT Stop Time 1158   PT Time Calculation (min) 43 min   Activity Tolerance Patient tolerated treatment well   Behavior During Therapy [facility] for tasks assessed/performed      Past Medical History  Diagnosis Date  . Labral tear of shoulder RIGHT SHOULDER  . Asthma   . Hypothyroidism   . History of breast cancer S/P RIGHT MASTECTOMY AND CHEMO 21 YRS AGO (APPROX.  1990)    NO RECURRENCE  . Right shoulder pain   . HOH (hard of hearing) NO AIDS    Past Surgical History  Procedure Laterality Date  . Excision of right chest mass  12-19-1999    BENIGN  . Mastectomy  1990  (APPROX.)    RIGHT BREAST CANCER -- NO RECURRENCE  . Orif bilateral wrist  2009  . Cataract extraction, bilateral    . Shoulder arthroscopy  12/06/2011    Procedure: ARTHROSCOPY SHOULDER;  Surgeon: Magnus Sinning, MD;  Location: [facility];  Service: Orthopedics;  Laterality: Right;  WITH LABRIAL DEBRIDEMENT AND SAD INTERSCALINE BLOCK    There were no vitals filed for this visit.  Visit Diagnosis:  Abnormality of gait  Coordination abnormal  Dizziness and giddiness  Gait disorder  Recurrent falls      Subjective Assessment - 01/22/15 1119    Subjective Denies any falls and states that she feels okay.   Patient is accompained by: Family member   Limitations Walking   Currently in Pain? No/denies            [facility] PT Assessment - 01/22/15 0001    Assessment   Medical Diagnosis gait disorder, recurrent falls, dizzines  and giddines   Onset Date/Surgical Date 09/14/14   Prior Therapy D/C'ed from OP neuro in February 2016   Precautions   Precautions Fall   Dynamic Gait Index   Level Surface Mild Impairment  Use of Rollator   Change in Gait Speed Mild Impairment  Use of Rollator   Gait with Horizontal Head Turns Mild Impairment  Use of Rollator   Gait with Vertical Head Turns Mild Impairment  Use of Rollator   Gait and Pivot Turn Normal   Step Over Obstacle Mild Impairment   Step Around Obstacles Mild Impairment   Steps Moderate Impairment   Total Score 16                     [facility] Adult PT Treatment/Exercise - 01/22/15 0001    Ambulation/Gait   Ambulation/Gait Yes   Ambulation/Gait Assistance 6: Modified independent (Device/Increase time)   Ambulation Distance (Feet) 500 Feet   Assistive device Rollator   Gait Pattern Within Functional Limits;Narrow base of support;Trunk flexed   Ambulation Surface Level;Indoor   Stairs Yes   Stair Management Technique One rail Right;Step to pattern;Forwards   Number of Stairs 8   Knee/Hip Exercises: Aerobic   Stationary Bike NuStep L5 x16 min             Balance  Exercises - 02/21/2015 1209    Balance Exercises: Standing   Tandem Gait Forward;Retro;Upper extremity support;Foam/compliant surface;4 reps   Sidestepping Foam/compliant support;3 reps             PT Short Term Goals - 2015/02/21 1145    PT SHORT TERM GOAL #1   Title Pt will perform HEP with mod I using paper handout to maximize functional gains made in physical therapy. Target: 12/25/14.   Time 4   Period Weeks   Status Achieved   PT SHORT TERM GOAL #2   Title Pt will verbalize understanding of fall prevention strategies to decrease risk of falling within home.  12/25/14.   Time 4   Period Weeks   Status Achieved   PT SHORT TERM GOAL #3   Title Pt will increase Dynamic Gait Index score from 12/24 to 15/24 to demo progress toward significant improvement in dynamic  gait stability. 12/25/14.   Time 4   Period Weeks   Status Achieved  18/24           PT Long Term Goals - Feb 21, 2015 1146    PT LONG TERM GOAL #1   Title Pt will score 19/24 on Dynamic Gait Index to demonstrate improved gait stability in the presence of external demands. Target: 21-Feb-2015.   Status Not Met  16/24 2015-02-21   PT LONG TERM GOAL #2   Title PT will ambulate x500' over level, indoor surfaces with mod I using LRAD with effective obstacle negotiation and no gait instability during turns. Target: February 21, 2015.   Time 8   Period Weeks   Status Achieved   PT LONG TERM GOAL #3   Title Pt will ambulate x500' over uneven, concrete surfaces with LRAD and mod I to progress toward community ambulation. Target: 02-21-15.   Time 8   Period Weeks   Status Achieved   PT LONG TERM GOAL #4   Title  Pt will increae self-selected gait speed from 2.0 to > or equal to 2.63 ft/sec to reach status as community ambulator. Target: Feb 21, 2015.   Time 8   Period Weeks   Status Achieved               Plan - 02-21-15 1201    Clinical Impression Statement Patient completed therapy very well today with use of rollator for ambulation throughout the session. Achieved all goals set at evaluation except for the DGI goal. DGI score taken today was 19/64. Did well with all gait exercises as well as the balance exercises completed at the end of the treatment. Neice is understanding of patient's needs and safety.    Pt will benefit from skilled therapeutic intervention in order to improve on the following deficits Abnormal gait;Decreased balance;Impaired perceived functional ability;Decreased strength;Decreased coordination;Dizziness   Rehab Potential Good   PT Frequency 2x / week   PT Duration 8 weeks   PT Treatment/Interventions Patient/family education;Therapeutic exercise;Gait training;Balance training;Neuromuscular re-education;Vestibular;ADLs/Self Care Home Management;DME Instruction;Stair  training;Therapeutic activities;Functional mobility training   PT Next Visit Plan Continue per PT POC for balance and gait activities.   Consulted and Agree with Plan of Care Patient          G-Codes - February 21, 2015 1304    Functional Assessment Tool Used Clinical judgement.   Functional Limitation Mobility: Walking and moving around   Mobility: Walking and Moving Around Current Status [phone]) At least 20 percent but less than 40 percent impaired, limited or restricted   Mobility: Walking and Moving Around Goal Status [phone])  At least 20 percent but less than 40 percent impaired, limited or restricted   Mobility: Walking and Moving Around Discharge Status [phone]) At least 20 percent but less than 40 percent impaired, limited or restricted      Problem List Patient Active Problem List   Diagnosis Date Noted  . Asthma, chronic 12/05/2012  . Osteoporosis 12/05/2012  . Hypothyroidism 12/05/2012   PHYSICAL THERAPY DISCHARGE SUMMARY  Visits from Start of Care: 9  Current functional level related to goals / functional outcomes: Please above.   Remaining deficits: LTG #1 not met.   Education / Equipment: HEP. Plan: Patient agrees to discharge.  Patient goals were partially met. Patient is being discharged due to meeting the stated rehab goals.  ?????      APPLE[place], [place]  MPT  01/22/2015, 1:05 PM  [facility] [address] [place], [place], [address] Phone: [phone]   Fax:  [phone]

## 2015-01-22 NOTE — Therapy (Signed)
[facility] [place], [place], [address] Phone: [phone]   Fax:  [phone]  Physical Therapy Treatment  Patient Details  Name: Sarah [place] MRN: [id number] Date of Birth: 1929/11/04 Referring Provider:  Chevis Pretty, *  Encounter Date: 01/22/2015      PT End of Session - 01/22/15 1120    Visit Number 9   Number of Visits 17   Date for PT Re-Evaluation 01/26/15   PT Start Time 1115   PT Stop Time 1158   PT Time Calculation (min) 43 min   Activity Tolerance Patient tolerated treatment well   Behavior During Therapy [facility] for tasks assessed/performed      Past Medical History  Diagnosis Date  . Labral tear of shoulder RIGHT SHOULDER  . Asthma   . Hypothyroidism   . History of breast cancer S/P RIGHT MASTECTOMY AND CHEMO 21 YRS AGO (APPROX.  1990)    NO RECURRENCE  . Right shoulder pain   . HOH (hard of hearing) NO AIDS    Past Surgical History  Procedure Laterality Date  . Excision of right chest mass  12-19-1999    BENIGN  . Mastectomy  1990  (APPROX.)    RIGHT BREAST CANCER -- NO RECURRENCE  . Orif bilateral wrist  2009  . Cataract extraction, bilateral    . Shoulder arthroscopy  12/06/2011    Procedure: ARTHROSCOPY SHOULDER;  Surgeon: Magnus Sinning, MD;  Location: [facility];  Service: Orthopedics;  Laterality: Right;  WITH LABRIAL DEBRIDEMENT AND SAD INTERSCALINE BLOCK    There were no vitals filed for this visit.  Visit Diagnosis:  Abnormality of gait  Coordination abnormal  Dizziness and giddiness  Gait disorder  Recurrent falls      Subjective Assessment - 01/22/15 1119    Subjective Denies any falls and states that she feels okay.   Patient is accompained by: Family member   Limitations Walking   Currently in Pain? No/denies            [facility] PT Assessment - 01/22/15 0001    Assessment   Medical Diagnosis gait disorder, recurrent falls, dizzines  and giddines   Onset Date/Surgical Date 09/14/14   Prior Therapy D/C'ed from OP neuro in February 2016   Precautions   Precautions Fall   Dynamic Gait Index   Level Surface Mild Impairment  Use of Rollator   Change in Gait Speed Mild Impairment  Use of Rollator   Gait with Horizontal Head Turns Mild Impairment  Use of Rollator   Gait with Vertical Head Turns Mild Impairment  Use of Rollator   Gait and Pivot Turn Normal   Step Over Obstacle Mild Impairment   Step Around Obstacles Mild Impairment   Steps Moderate Impairment   Total Score 16                     [facility] Adult PT Treatment/Exercise - 01/22/15 0001    Ambulation/Gait   Ambulation/Gait Yes   Ambulation/Gait Assistance 6: Modified independent (Device/Increase time)   Ambulation Distance (Feet) 500 Feet   Assistive device Rollator   Gait Pattern Within Functional Limits;Narrow base of support;Trunk flexed   Ambulation Surface Level;Indoor   Stairs Yes   Stair Management Technique One rail Right;Step to pattern;Forwards   Number of Stairs 8   Knee/Hip Exercises: Aerobic   Stationary Bike NuStep L5 x16 min             Balance  Exercises - 01/22/15 1209    Balance Exercises: Standing   Tandem Gait Forward;Retro;Upper extremity support;Foam/compliant surface;4 reps   Sidestepping Foam/compliant support;3 reps             PT Short Term Goals - 01/22/15 1145    PT SHORT TERM GOAL #1   Title Pt will perform HEP with mod I using paper handout to maximize functional gains made in physical therapy. Target: 12/25/14.   Time 4   Period Weeks   Status Achieved   PT SHORT TERM GOAL #2   Title Pt will verbalize understanding of fall prevention strategies to decrease risk of falling within home.  12/25/14.   Time 4   Period Weeks   Status Achieved   PT SHORT TERM GOAL #3   Title Pt will increase Dynamic Gait Index score from 12/24 to 15/24 to demo progress toward significant improvement in dynamic  gait stability. 12/25/14.   Time 4   Period Weeks   Status Achieved  18/24           PT Long Term Goals - 01/22/15 1146    PT LONG TERM GOAL #1   Title Pt will score 19/24 on Dynamic Gait Index to demonstrate improved gait stability in the presence of external demands. Target: 01/22/15.   Status Not Met  16/24 01/22/2015   PT LONG TERM GOAL #2   Title PT will ambulate x500' over level, indoor surfaces with mod I using LRAD with effective obstacle negotiation and no gait instability during turns. Target: 01/22/15.   Time 8   Period Weeks   Status Achieved   PT LONG TERM GOAL #3   Title Pt will ambulate x500' over uneven, concrete surfaces with LRAD and mod I to progress toward community ambulation. Target: 01/22/15.   Time 8   Period Weeks   Status Achieved   PT LONG TERM GOAL #4   Title  Pt will increae self-selected gait speed from 2.0 to > or equal to 2.63 ft/sec to reach status as community ambulator. Target: 01/22/15.   Time 8   Period Weeks   Status Achieved               Plan - 01/22/15 1201    Clinical Impression Statement Patient completed therapy very well today with use of rollator for ambulation throughout the session. Achieved all goals set at evaluation except for the DGI goal. DGI score taken today was 19/64. Did well with all gait exercises as well as the balance exercises completed at the end of the treatment. Neice is understanding of patient's needs and safety.    Pt will benefit from skilled therapeutic intervention in order to improve on the following deficits Abnormal gait;Decreased balance;Impaired perceived functional ability;Decreased strength;Decreased coordination;Dizziness   Rehab Potential Good   PT Frequency 2x / week   PT Duration 8 weeks   PT Treatment/Interventions Patient/family education;Therapeutic exercise;Gait training;Balance training;Neuromuscular re-education;Vestibular;ADLs/Self Care Home Management;DME Instruction;Stair  training;Therapeutic activities;Functional mobility training   PT Next Visit Plan Continue per PT POC for balance and gait activities.   Consulted and Agree with Plan of Care Patient        Problem List Patient Active Problem List   Diagnosis Date Noted  . Asthma, chronic 12/05/2012  . Osteoporosis 12/05/2012  . Hypothyroidism 12/05/2012    Ahmed Prima, PTA 01/22/2015 12:15 PM  [facility] [place], [place], [address] Phone: [phone]   Fax:  [phone]

## 2015-01-23 ENCOUNTER — Other Ambulatory Visit: Payer: Self-pay | Admitting: Nurse Practitioner

## 2015-01-25 DIAGNOSIS — R296 Repeated falls: Secondary | ICD-10-CM | POA: Diagnosis not present

## 2015-01-25 DIAGNOSIS — R2689 Other abnormalities of gait and mobility: Secondary | ICD-10-CM | POA: Diagnosis not present

## 2015-01-25 DIAGNOSIS — R42 Dizziness and giddiness: Secondary | ICD-10-CM | POA: Diagnosis not present

## 2015-01-26 DIAGNOSIS — R2689 Other abnormalities of gait and mobility: Secondary | ICD-10-CM | POA: Diagnosis not present

## 2015-01-26 DIAGNOSIS — R296 Repeated falls: Secondary | ICD-10-CM | POA: Diagnosis not present

## 2015-01-26 DIAGNOSIS — R42 Dizziness and giddiness: Secondary | ICD-10-CM | POA: Diagnosis not present

## 2015-01-27 DIAGNOSIS — R2689 Other abnormalities of gait and mobility: Secondary | ICD-10-CM | POA: Diagnosis not present

## 2015-01-27 DIAGNOSIS — R42 Dizziness and giddiness: Secondary | ICD-10-CM | POA: Diagnosis not present

## 2015-01-27 DIAGNOSIS — R296 Repeated falls: Secondary | ICD-10-CM | POA: Diagnosis not present

## 2015-01-28 DIAGNOSIS — R42 Dizziness and giddiness: Secondary | ICD-10-CM | POA: Diagnosis not present

## 2015-01-28 DIAGNOSIS — R296 Repeated falls: Secondary | ICD-10-CM | POA: Diagnosis not present

## 2015-01-28 DIAGNOSIS — R2689 Other abnormalities of gait and mobility: Secondary | ICD-10-CM | POA: Diagnosis not present

## 2015-01-29 DIAGNOSIS — R2689 Other abnormalities of gait and mobility: Secondary | ICD-10-CM | POA: Diagnosis not present

## 2015-01-29 DIAGNOSIS — R42 Dizziness and giddiness: Secondary | ICD-10-CM | POA: Diagnosis not present

## 2015-01-29 DIAGNOSIS — R296 Repeated falls: Secondary | ICD-10-CM | POA: Diagnosis not present

## 2015-02-01 ENCOUNTER — Ambulatory Visit (INDEPENDENT_AMBULATORY_CARE_PROVIDER_SITE_OTHER): Payer: Commercial Managed Care - HMO | Admitting: Nurse Practitioner

## 2015-02-01 ENCOUNTER — Ambulatory Visit (INDEPENDENT_AMBULATORY_CARE_PROVIDER_SITE_OTHER): Payer: Commercial Managed Care - HMO

## 2015-02-01 ENCOUNTER — Encounter: Payer: Self-pay | Admitting: Nurse Practitioner

## 2015-02-01 VITALS — BP 128/67 | HR 78 | Temp 96.9°F | Ht 61.0 in | Wt 105.0 lb

## 2015-02-01 DIAGNOSIS — J4520 Mild intermittent asthma, uncomplicated: Secondary | ICD-10-CM | POA: Diagnosis not present

## 2015-02-01 DIAGNOSIS — R42 Dizziness and giddiness: Secondary | ICD-10-CM | POA: Diagnosis not present

## 2015-02-01 DIAGNOSIS — E038 Other specified hypothyroidism: Secondary | ICD-10-CM

## 2015-02-01 DIAGNOSIS — M810 Age-related osteoporosis without current pathological fracture: Secondary | ICD-10-CM

## 2015-02-01 DIAGNOSIS — R296 Repeated falls: Secondary | ICD-10-CM | POA: Diagnosis not present

## 2015-02-01 DIAGNOSIS — R2689 Other abnormalities of gait and mobility: Secondary | ICD-10-CM | POA: Diagnosis not present

## 2015-02-01 DIAGNOSIS — E034 Atrophy of thyroid (acquired): Secondary | ICD-10-CM

## 2015-02-01 MED ORDER — LEVOTHYROXINE SODIUM 25 MCG PO TABS: ORAL_TABLET | ORAL | Status: DC

## 2015-02-01 MED ORDER — FLUTICASONE-SALMETEROL 100-50 MCG/DOSE IN AEPB: 1.0000 | INHALATION_SPRAY | Freq: Two times a day (BID) | RESPIRATORY_TRACT | Status: DC

## 2015-02-01 MED ORDER — ALENDRONATE SODIUM 70 MG PO TABS: ORAL_TABLET | ORAL | Status: DC

## 2015-02-01 MED ORDER — MONTELUKAST SODIUM 10 MG PO TABS: ORAL_TABLET | ORAL | Status: DC

## 2015-02-01 MED ORDER — ALBUTEROL SULFATE HFA 108 (90 BASE) MCG/ACT IN AERS: 2.0000 | INHALATION_SPRAY | Freq: Four times a day (QID) | RESPIRATORY_TRACT | Status: DC | PRN

## 2015-02-01 NOTE — Patient Instructions (Signed)

## 2015-02-01 NOTE — Progress Notes (Signed)
Subjective:    Patient ID: Sarah [place], female    DOB: Nov 16, 1929, 81 y.o.   MRN: [id number]  HPI  Patient in today for follow up of chronic medical problems. SHe is doing well. No changes since last visit other than she is starting to be a little weak and is now walking with a walker. She has had 2 falls and has broken or her pelvis with one fall- that has healed but is stil walking with walker.  Hypothyroidism Currently on synthroid 41mg daily- no complaints Asthma Takes singulair and advair daily has not need albuterol in quite sometime. Osteoporosis Fosamax weekly- has some back pain that she takes ultram for.  Review of Systems  Constitutional: Negative.   HENT: Negative.   Respiratory: Negative.   Genitourinary: Negative.   Neurological: Negative.   Psychiatric/Behavioral: Negative.   All other systems reviewed and are negative.      Objective:   Physical Exam  Constitutional: She is oriented to person, place, and time. She appears well-developed and well-nourished.  HENT:  Nose: Nose normal.  Mouth/Throat: Oropharynx is clear and moist.  Eyes: EOM are normal.  Neck: Trachea normal, normal range of motion and full passive range of motion without pain. Neck supple. No JVD present. Carotid bruit is not present. No thyromegaly present.  Cardiovascular: Normal rate, regular rhythm, normal heart sounds and intact distal pulses.  Exam reveals no gallop and no friction rub.   No murmur heard. Pulmonary/Chest: Effort normal and breath sounds normal.  Abdominal: Soft. Bowel sounds are normal. She exhibits no distension and no mass. There is no tenderness.  Musculoskeletal: Normal range of motion.  Patient walking with walker- steady and slow  Lymphadenopathy:    She has no cervical adenopathy.  Neurological: She is alert and oriented to person, place, and time. She has normal reflexes. No cranial nerve deficit.  Skin: Skin is warm and dry.  Psychiatric: She has a normal  mood and affect. Her behavior is normal. Judgment and thought content normal.    BP 128/67 mmHg  Pulse 78  Temp(Src) 96.9 F (36.1 C) (Oral)  Ht 5' 1" (1.549 m)  Wt 105 lb (47.628 kg)  BMI 19.85 kg/m2        Assessment & Plan:   1. Hypothyroidism due to acquired atrophy of thyroid  2. Osteoporosis - DG Bone Density; Future  3. Asthma, chronic, mild intermittent, uncomplicated  Orders Placed This Encounter  Procedures  . DG Bone Density    Standing Status: Future     Number of Occurrences:      Standing Expiration Date: 04/02/2016    Order Specific Question:  Reason for Exam (SYMPTOM  OR DIAGNOSIS REQUIRED)    Answer:  falls    Order Specific Question:  Preferred imaging location?    Answer:  Internal  . CMP14+EGFR  . Lipid panel  . Thyroid Panel With TSH   Meds ordered this encounter  Medications  . alendronate (FOSAMAX) 70 MG tablet    Sig: TAKE 1 TABLET BY MOUTH ONCE A WEEK WITH A FULL GLASS OF WATER ON AN EMPTY STOMACH    Dispense:  4 tablet    Refill:  5    Order Specific Question:  Supervising Provider    Answer:  MChipper Herb[[id number]]  . montelukast (SINGULAIR) 10 MG tablet    Sig: TAKE 1 TABLET (10 MG TOTAL) BY MOUTH AT BEDTIME.    Dispense:  30 tablet    Refill:  5    Order Specific Question:  Supervising Provider    Answer:  Chipper Herb [[id number]]  . Fluticasone-Salmeterol (ADVAIR) 100-50 MCG/DOSE AEPB    Sig: Inhale 1 puff into the lungs 2 (two) times daily.    Dispense:  1 each    Refill:  5    Order Specific Question:  Supervising Provider    Answer:  Chipper Herb [[id number]]  . levothyroxine (SYNTHROID, LEVOTHROID) 25 MCG tablet    Sig: TAKE 1 TABLET EVERY MORNING ON AN EMPTY STOMACH NEEDS TO BE SEEN    Dispense:  30 tablet    Refill:  3    Order Specific Question:  Supervising Provider    Answer:  Chipper Herb [[id number]]  . albuterol (PROAIR HFA) 108 (90 BASE) MCG/ACT inhaler    Sig: Inhale 2 puffs into the lungs every 6 (six) hours as  needed for wheezing or shortness of breath.    Dispense:  1 each    Refill:  1    Order Specific Question:  Supervising Provider    Answer:  Chipper Herb [place] pending Health maintenance reviewed Diet and exercise encouraged Continue all meds Follow up  In 6 months   [place], FNP

## 2015-02-02 ENCOUNTER — Telehealth: Payer: Self-pay | Admitting: Nurse Practitioner

## 2015-02-02 DIAGNOSIS — R42 Dizziness and giddiness: Secondary | ICD-10-CM | POA: Diagnosis not present

## 2015-02-02 DIAGNOSIS — R2689 Other abnormalities of gait and mobility: Secondary | ICD-10-CM | POA: Diagnosis not present

## 2015-02-02 DIAGNOSIS — R296 Repeated falls: Secondary | ICD-10-CM | POA: Diagnosis not present

## 2015-02-02 LAB — CMP14+EGFR
ALK PHOS: 64 IU/L (ref 39–117)
ALT: 14 IU/L (ref 0–32)
AST: 24 IU/L (ref 0–40)
Albumin/Globulin Ratio: 1.8 (ref 1.1–2.5)
Albumin: 4.4 g/dL (ref 3.5–4.7)
BILIRUBIN TOTAL: 0.3 mg/dL (ref 0.0–1.2)
BUN/Creatinine Ratio: 15 (ref 11–26)
BUN: 13 mg/dL (ref 8–27)
CHLORIDE: 101 mmol/L (ref 97–108)
CO2: 26 mmol/L (ref 18–29)
CREATININE: 0.86 mg/dL (ref 0.57–1.00)
Calcium: 9.4 mg/dL (ref 8.7–10.3)
GFR calc Af Amer: 73 mL/min/{1.73_m2} (ref >59)
GFR calc non Af Amer: 63 mL/min/{1.73_m2} (ref >59)
GLUCOSE: 67 mg/dL (ref 65–99)
Globulin, Total: 2.4 g/dL (ref 1.5–4.5)
Potassium: 4.8 mmol/L (ref 3.5–5.2)
Sodium: 143 mmol/L (ref 134–144)
Total Protein: 6.8 g/dL (ref 6.0–8.5)

## 2015-02-02 LAB — LIPID PANEL
CHOLESTEROL TOTAL: 181 mg/dL (ref 100–199)
Chol/HDL Ratio: 1.9 ratio units (ref 0.0–4.4)
HDL: 93 mg/dL (ref >39)
LDL Calculated: 55 mg/dL (ref 0–99)
Triglycerides: 165 mg/dL — ABNORMAL HIGH (ref 0–149)
VLDL CHOLESTEROL CAL: 33 mg/dL (ref 5–40)

## 2015-02-02 LAB — THYROID PANEL WITH TSH
FREE THYROXINE INDEX: 2 (ref 1.2–4.9)
T3 UPTAKE RATIO: 27 % (ref 24–39)
T4 TOTAL: 7.3 ug/dL (ref 4.5–12.0)
TSH: 3.28 u[IU]/mL (ref 0.450–4.500)

## 2015-02-03 DIAGNOSIS — R296 Repeated falls: Secondary | ICD-10-CM | POA: Diagnosis not present

## 2015-02-03 DIAGNOSIS — R42 Dizziness and giddiness: Secondary | ICD-10-CM | POA: Diagnosis not present

## 2015-02-03 DIAGNOSIS — R2689 Other abnormalities of gait and mobility: Secondary | ICD-10-CM | POA: Diagnosis not present

## 2015-02-04 DIAGNOSIS — R296 Repeated falls: Secondary | ICD-10-CM | POA: Diagnosis not present

## 2015-02-04 DIAGNOSIS — R42 Dizziness and giddiness: Secondary | ICD-10-CM | POA: Diagnosis not present

## 2015-02-04 DIAGNOSIS — R2689 Other abnormalities of gait and mobility: Secondary | ICD-10-CM | POA: Diagnosis not present

## 2015-02-05 DIAGNOSIS — R296 Repeated falls: Secondary | ICD-10-CM | POA: Diagnosis not present

## 2015-02-05 DIAGNOSIS — R2689 Other abnormalities of gait and mobility: Secondary | ICD-10-CM | POA: Diagnosis not present

## 2015-02-05 DIAGNOSIS — R42 Dizziness and giddiness: Secondary | ICD-10-CM | POA: Diagnosis not present

## 2015-02-08 ENCOUNTER — Other Ambulatory Visit: Payer: Self-pay

## 2015-02-08 DIAGNOSIS — R2689 Other abnormalities of gait and mobility: Secondary | ICD-10-CM | POA: Diagnosis not present

## 2015-02-08 DIAGNOSIS — Z1231 Encounter for screening mammogram for malignant neoplasm of breast: Secondary | ICD-10-CM

## 2015-02-08 DIAGNOSIS — R296 Repeated falls: Secondary | ICD-10-CM | POA: Diagnosis not present

## 2015-02-08 DIAGNOSIS — R42 Dizziness and giddiness: Secondary | ICD-10-CM | POA: Diagnosis not present

## 2015-02-09 DIAGNOSIS — R42 Dizziness and giddiness: Secondary | ICD-10-CM | POA: Diagnosis not present

## 2015-02-09 DIAGNOSIS — R296 Repeated falls: Secondary | ICD-10-CM | POA: Diagnosis not present

## 2015-02-09 DIAGNOSIS — R2689 Other abnormalities of gait and mobility: Secondary | ICD-10-CM | POA: Diagnosis not present

## 2015-02-10 DIAGNOSIS — R42 Dizziness and giddiness: Secondary | ICD-10-CM | POA: Diagnosis not present

## 2015-02-10 DIAGNOSIS — R2689 Other abnormalities of gait and mobility: Secondary | ICD-10-CM | POA: Diagnosis not present

## 2015-02-10 DIAGNOSIS — R296 Repeated falls: Secondary | ICD-10-CM | POA: Diagnosis not present

## 2015-02-11 DIAGNOSIS — R2689 Other abnormalities of gait and mobility: Secondary | ICD-10-CM | POA: Diagnosis not present

## 2015-02-11 DIAGNOSIS — R296 Repeated falls: Secondary | ICD-10-CM | POA: Diagnosis not present

## 2015-02-11 DIAGNOSIS — R42 Dizziness and giddiness: Secondary | ICD-10-CM | POA: Diagnosis not present

## 2015-02-12 DIAGNOSIS — R296 Repeated falls: Secondary | ICD-10-CM | POA: Diagnosis not present

## 2015-02-12 DIAGNOSIS — R2689 Other abnormalities of gait and mobility: Secondary | ICD-10-CM | POA: Diagnosis not present

## 2015-02-12 DIAGNOSIS — R42 Dizziness and giddiness: Secondary | ICD-10-CM | POA: Diagnosis not present

## 2015-02-15 DIAGNOSIS — R296 Repeated falls: Secondary | ICD-10-CM | POA: Diagnosis not present

## 2015-02-15 DIAGNOSIS — R42 Dizziness and giddiness: Secondary | ICD-10-CM | POA: Diagnosis not present

## 2015-02-15 DIAGNOSIS — R2689 Other abnormalities of gait and mobility: Secondary | ICD-10-CM | POA: Diagnosis not present

## 2015-02-16 DIAGNOSIS — R2689 Other abnormalities of gait and mobility: Secondary | ICD-10-CM | POA: Diagnosis not present

## 2015-02-16 DIAGNOSIS — R296 Repeated falls: Secondary | ICD-10-CM | POA: Diagnosis not present

## 2015-02-16 DIAGNOSIS — R42 Dizziness and giddiness: Secondary | ICD-10-CM | POA: Diagnosis not present

## 2015-02-16 NOTE — Telephone Encounter (Signed)
LMTCB on 02/04/15, 02/10/15 and 02/16/15.

## 2015-02-17 DIAGNOSIS — R296 Repeated falls: Secondary | ICD-10-CM | POA: Diagnosis not present

## 2015-02-17 DIAGNOSIS — R42 Dizziness and giddiness: Secondary | ICD-10-CM | POA: Diagnosis not present

## 2015-02-17 DIAGNOSIS — R2689 Other abnormalities of gait and mobility: Secondary | ICD-10-CM | POA: Diagnosis not present

## 2015-02-18 DIAGNOSIS — R296 Repeated falls: Secondary | ICD-10-CM | POA: Diagnosis not present

## 2015-02-18 DIAGNOSIS — R42 Dizziness and giddiness: Secondary | ICD-10-CM | POA: Diagnosis not present

## 2015-02-18 DIAGNOSIS — R2689 Other abnormalities of gait and mobility: Secondary | ICD-10-CM | POA: Diagnosis not present

## 2015-02-19 DIAGNOSIS — R2689 Other abnormalities of gait and mobility: Secondary | ICD-10-CM | POA: Diagnosis not present

## 2015-02-19 DIAGNOSIS — R42 Dizziness and giddiness: Secondary | ICD-10-CM | POA: Diagnosis not present

## 2015-02-19 DIAGNOSIS — R296 Repeated falls: Secondary | ICD-10-CM | POA: Diagnosis not present

## 2015-02-22 DIAGNOSIS — R296 Repeated falls: Secondary | ICD-10-CM | POA: Diagnosis not present

## 2015-02-22 DIAGNOSIS — R42 Dizziness and giddiness: Secondary | ICD-10-CM | POA: Diagnosis not present

## 2015-02-22 DIAGNOSIS — R2689 Other abnormalities of gait and mobility: Secondary | ICD-10-CM | POA: Diagnosis not present

## 2015-02-23 DIAGNOSIS — R296 Repeated falls: Secondary | ICD-10-CM | POA: Diagnosis not present

## 2015-02-23 DIAGNOSIS — R42 Dizziness and giddiness: Secondary | ICD-10-CM | POA: Diagnosis not present

## 2015-02-23 DIAGNOSIS — R2689 Other abnormalities of gait and mobility: Secondary | ICD-10-CM | POA: Diagnosis not present

## 2015-02-24 DIAGNOSIS — R2689 Other abnormalities of gait and mobility: Secondary | ICD-10-CM | POA: Diagnosis not present

## 2015-02-24 DIAGNOSIS — R296 Repeated falls: Secondary | ICD-10-CM | POA: Diagnosis not present

## 2015-02-24 DIAGNOSIS — R42 Dizziness and giddiness: Secondary | ICD-10-CM | POA: Diagnosis not present

## 2015-02-25 DIAGNOSIS — R2689 Other abnormalities of gait and mobility: Secondary | ICD-10-CM | POA: Diagnosis not present

## 2015-02-25 DIAGNOSIS — R296 Repeated falls: Secondary | ICD-10-CM | POA: Diagnosis not present

## 2015-02-25 DIAGNOSIS — R42 Dizziness and giddiness: Secondary | ICD-10-CM | POA: Diagnosis not present

## 2015-02-26 DIAGNOSIS — R296 Repeated falls: Secondary | ICD-10-CM | POA: Diagnosis not present

## 2015-02-26 DIAGNOSIS — R2689 Other abnormalities of gait and mobility: Secondary | ICD-10-CM | POA: Diagnosis not present

## 2015-02-26 DIAGNOSIS — R42 Dizziness and giddiness: Secondary | ICD-10-CM | POA: Diagnosis not present

## 2015-03-01 DIAGNOSIS — R296 Repeated falls: Secondary | ICD-10-CM | POA: Diagnosis not present

## 2015-03-01 DIAGNOSIS — R42 Dizziness and giddiness: Secondary | ICD-10-CM | POA: Diagnosis not present

## 2015-03-01 DIAGNOSIS — R2689 Other abnormalities of gait and mobility: Secondary | ICD-10-CM | POA: Diagnosis not present

## 2015-03-02 DIAGNOSIS — R42 Dizziness and giddiness: Secondary | ICD-10-CM | POA: Diagnosis not present

## 2015-03-02 DIAGNOSIS — R2689 Other abnormalities of gait and mobility: Secondary | ICD-10-CM | POA: Diagnosis not present

## 2015-03-02 DIAGNOSIS — R296 Repeated falls: Secondary | ICD-10-CM | POA: Diagnosis not present

## 2015-03-03 DIAGNOSIS — R2689 Other abnormalities of gait and mobility: Secondary | ICD-10-CM | POA: Diagnosis not present

## 2015-03-03 DIAGNOSIS — R42 Dizziness and giddiness: Secondary | ICD-10-CM | POA: Diagnosis not present

## 2015-03-03 DIAGNOSIS — R296 Repeated falls: Secondary | ICD-10-CM | POA: Diagnosis not present

## 2015-03-04 DIAGNOSIS — R42 Dizziness and giddiness: Secondary | ICD-10-CM | POA: Diagnosis not present

## 2015-03-04 DIAGNOSIS — R2689 Other abnormalities of gait and mobility: Secondary | ICD-10-CM | POA: Diagnosis not present

## 2015-03-04 DIAGNOSIS — R296 Repeated falls: Secondary | ICD-10-CM | POA: Diagnosis not present

## 2015-03-05 DIAGNOSIS — R2689 Other abnormalities of gait and mobility: Secondary | ICD-10-CM | POA: Diagnosis not present

## 2015-03-05 DIAGNOSIS — R42 Dizziness and giddiness: Secondary | ICD-10-CM | POA: Diagnosis not present

## 2015-03-05 DIAGNOSIS — R296 Repeated falls: Secondary | ICD-10-CM | POA: Diagnosis not present

## 2015-03-08 DIAGNOSIS — R296 Repeated falls: Secondary | ICD-10-CM | POA: Diagnosis not present

## 2015-03-08 DIAGNOSIS — R42 Dizziness and giddiness: Secondary | ICD-10-CM | POA: Diagnosis not present

## 2015-03-08 DIAGNOSIS — R2689 Other abnormalities of gait and mobility: Secondary | ICD-10-CM | POA: Diagnosis not present

## 2015-03-09 DIAGNOSIS — R2689 Other abnormalities of gait and mobility: Secondary | ICD-10-CM | POA: Diagnosis not present

## 2015-03-09 DIAGNOSIS — R42 Dizziness and giddiness: Secondary | ICD-10-CM | POA: Diagnosis not present

## 2015-03-09 DIAGNOSIS — R296 Repeated falls: Secondary | ICD-10-CM | POA: Diagnosis not present

## 2015-03-10 DIAGNOSIS — R2689 Other abnormalities of gait and mobility: Secondary | ICD-10-CM | POA: Diagnosis not present

## 2015-03-10 DIAGNOSIS — R296 Repeated falls: Secondary | ICD-10-CM | POA: Diagnosis not present

## 2015-03-10 DIAGNOSIS — R42 Dizziness and giddiness: Secondary | ICD-10-CM | POA: Diagnosis not present

## 2015-03-11 DIAGNOSIS — R296 Repeated falls: Secondary | ICD-10-CM | POA: Diagnosis not present

## 2015-03-11 DIAGNOSIS — R2689 Other abnormalities of gait and mobility: Secondary | ICD-10-CM | POA: Diagnosis not present

## 2015-03-11 DIAGNOSIS — R42 Dizziness and giddiness: Secondary | ICD-10-CM | POA: Diagnosis not present

## 2015-03-12 DIAGNOSIS — R296 Repeated falls: Secondary | ICD-10-CM | POA: Diagnosis not present

## 2015-03-12 DIAGNOSIS — R42 Dizziness and giddiness: Secondary | ICD-10-CM | POA: Diagnosis not present

## 2015-03-12 DIAGNOSIS — R2689 Other abnormalities of gait and mobility: Secondary | ICD-10-CM | POA: Diagnosis not present

## 2015-03-15 DIAGNOSIS — R296 Repeated falls: Secondary | ICD-10-CM | POA: Diagnosis not present

## 2015-03-15 DIAGNOSIS — R42 Dizziness and giddiness: Secondary | ICD-10-CM | POA: Diagnosis not present

## 2015-03-15 DIAGNOSIS — R2689 Other abnormalities of gait and mobility: Secondary | ICD-10-CM | POA: Diagnosis not present

## 2015-03-16 ENCOUNTER — Telehealth: Payer: Self-pay | Admitting: Nurse Practitioner

## 2015-03-16 DIAGNOSIS — R2689 Other abnormalities of gait and mobility: Secondary | ICD-10-CM | POA: Diagnosis not present

## 2015-03-16 DIAGNOSIS — R42 Dizziness and giddiness: Secondary | ICD-10-CM | POA: Diagnosis not present

## 2015-03-16 DIAGNOSIS — R296 Repeated falls: Secondary | ICD-10-CM | POA: Diagnosis not present

## 2015-03-17 DIAGNOSIS — R2689 Other abnormalities of gait and mobility: Secondary | ICD-10-CM | POA: Diagnosis not present

## 2015-03-17 DIAGNOSIS — R296 Repeated falls: Secondary | ICD-10-CM | POA: Diagnosis not present

## 2015-03-17 DIAGNOSIS — R42 Dizziness and giddiness: Secondary | ICD-10-CM | POA: Diagnosis not present

## 2015-03-18 DIAGNOSIS — R296 Repeated falls: Secondary | ICD-10-CM | POA: Diagnosis not present

## 2015-03-18 DIAGNOSIS — R2689 Other abnormalities of gait and mobility: Secondary | ICD-10-CM | POA: Diagnosis not present

## 2015-03-18 DIAGNOSIS — R42 Dizziness and giddiness: Secondary | ICD-10-CM | POA: Diagnosis not present

## 2015-03-19 DIAGNOSIS — R42 Dizziness and giddiness: Secondary | ICD-10-CM | POA: Diagnosis not present

## 2015-03-19 DIAGNOSIS — R296 Repeated falls: Secondary | ICD-10-CM | POA: Diagnosis not present

## 2015-03-19 DIAGNOSIS — R2689 Other abnormalities of gait and mobility: Secondary | ICD-10-CM | POA: Diagnosis not present

## 2015-03-22 DIAGNOSIS — R42 Dizziness and giddiness: Secondary | ICD-10-CM | POA: Diagnosis not present

## 2015-03-22 DIAGNOSIS — R296 Repeated falls: Secondary | ICD-10-CM | POA: Diagnosis not present

## 2015-03-22 DIAGNOSIS — R2689 Other abnormalities of gait and mobility: Secondary | ICD-10-CM | POA: Diagnosis not present

## 2015-03-23 DIAGNOSIS — R2689 Other abnormalities of gait and mobility: Secondary | ICD-10-CM | POA: Diagnosis not present

## 2015-03-23 DIAGNOSIS — R42 Dizziness and giddiness: Secondary | ICD-10-CM | POA: Diagnosis not present

## 2015-03-23 DIAGNOSIS — R296 Repeated falls: Secondary | ICD-10-CM | POA: Diagnosis not present

## 2015-03-24 DIAGNOSIS — R296 Repeated falls: Secondary | ICD-10-CM | POA: Diagnosis not present

## 2015-03-24 DIAGNOSIS — R2689 Other abnormalities of gait and mobility: Secondary | ICD-10-CM | POA: Diagnosis not present

## 2015-03-24 DIAGNOSIS — R42 Dizziness and giddiness: Secondary | ICD-10-CM | POA: Diagnosis not present

## 2015-03-25 ENCOUNTER — Ambulatory Visit
Admission: RE | Admit: 2015-03-25 | Discharge: 2015-03-25 | Disposition: A | Discharge status: Home / Self Care | Payer: Commercial Managed Care - HMO | Source: Ambulatory Visit

## 2015-03-25 ENCOUNTER — Other Ambulatory Visit: Payer: Self-pay

## 2015-03-25 DIAGNOSIS — Z1231 Encounter for screening mammogram for malignant neoplasm of breast: Secondary | ICD-10-CM

## 2015-03-25 DIAGNOSIS — R296 Repeated falls: Secondary | ICD-10-CM | POA: Diagnosis not present

## 2015-03-25 DIAGNOSIS — R42 Dizziness and giddiness: Secondary | ICD-10-CM | POA: Diagnosis not present

## 2015-03-25 DIAGNOSIS — R2689 Other abnormalities of gait and mobility: Secondary | ICD-10-CM | POA: Diagnosis not present

## 2015-03-25 NOTE — Telephone Encounter (Signed)
Told niece we have not received form, call them

## 2015-03-26 DIAGNOSIS — R296 Repeated falls: Secondary | ICD-10-CM | POA: Diagnosis not present

## 2015-03-26 DIAGNOSIS — R42 Dizziness and giddiness: Secondary | ICD-10-CM | POA: Diagnosis not present

## 2015-03-26 DIAGNOSIS — R2689 Other abnormalities of gait and mobility: Secondary | ICD-10-CM | POA: Diagnosis not present

## 2015-03-26 NOTE — Telephone Encounter (Signed)
Forms received 03/25/15, signed and faxed, niece aware

## 2015-03-29 DIAGNOSIS — R42 Dizziness and giddiness: Secondary | ICD-10-CM | POA: Diagnosis not present

## 2015-03-29 DIAGNOSIS — R296 Repeated falls: Secondary | ICD-10-CM | POA: Diagnosis not present

## 2015-03-29 DIAGNOSIS — R2689 Other abnormalities of gait and mobility: Secondary | ICD-10-CM | POA: Diagnosis not present

## 2015-03-30 ENCOUNTER — Telehealth: Payer: Self-pay | Admitting: Nurse Practitioner

## 2015-03-30 DIAGNOSIS — R42 Dizziness and giddiness: Secondary | ICD-10-CM | POA: Diagnosis not present

## 2015-03-30 DIAGNOSIS — R296 Repeated falls: Secondary | ICD-10-CM | POA: Diagnosis not present

## 2015-03-30 DIAGNOSIS — R2689 Other abnormalities of gait and mobility: Secondary | ICD-10-CM | POA: Diagnosis not present

## 2015-03-31 DIAGNOSIS — R42 Dizziness and giddiness: Secondary | ICD-10-CM | POA: Diagnosis not present

## 2015-03-31 DIAGNOSIS — R2689 Other abnormalities of gait and mobility: Secondary | ICD-10-CM | POA: Diagnosis not present

## 2015-03-31 DIAGNOSIS — R296 Repeated falls: Secondary | ICD-10-CM | POA: Diagnosis not present

## 2015-04-01 DIAGNOSIS — R42 Dizziness and giddiness: Secondary | ICD-10-CM | POA: Diagnosis not present

## 2015-04-01 DIAGNOSIS — R2689 Other abnormalities of gait and mobility: Secondary | ICD-10-CM | POA: Diagnosis not present

## 2015-04-01 DIAGNOSIS — R296 Repeated falls: Secondary | ICD-10-CM | POA: Diagnosis not present

## 2015-04-01 NOTE — Telephone Encounter (Signed)
Rx faxed

## 2015-04-02 ENCOUNTER — Telehealth: Payer: Self-pay | Admitting: Nurse Practitioner

## 2015-04-02 DIAGNOSIS — L299 Pruritus, unspecified: Secondary | ICD-10-CM

## 2015-04-02 DIAGNOSIS — R296 Repeated falls: Secondary | ICD-10-CM | POA: Diagnosis not present

## 2015-04-02 DIAGNOSIS — R2689 Other abnormalities of gait and mobility: Secondary | ICD-10-CM | POA: Diagnosis not present

## 2015-04-02 DIAGNOSIS — R42 Dizziness and giddiness: Secondary | ICD-10-CM | POA: Diagnosis not present

## 2015-04-05 DIAGNOSIS — R2689 Other abnormalities of gait and mobility: Secondary | ICD-10-CM | POA: Diagnosis not present

## 2015-04-05 DIAGNOSIS — R296 Repeated falls: Secondary | ICD-10-CM | POA: Diagnosis not present

## 2015-04-05 DIAGNOSIS — R42 Dizziness and giddiness: Secondary | ICD-10-CM | POA: Diagnosis not present

## 2015-04-05 NOTE — Telephone Encounter (Signed)
Referral made to derm

## 2015-04-05 NOTE — Telephone Encounter (Signed)
Pt aware.

## 2015-04-06 DIAGNOSIS — R296 Repeated falls: Secondary | ICD-10-CM | POA: Diagnosis not present

## 2015-04-06 DIAGNOSIS — R2689 Other abnormalities of gait and mobility: Secondary | ICD-10-CM | POA: Diagnosis not present

## 2015-04-06 DIAGNOSIS — R42 Dizziness and giddiness: Secondary | ICD-10-CM | POA: Diagnosis not present

## 2015-04-07 DIAGNOSIS — R42 Dizziness and giddiness: Secondary | ICD-10-CM | POA: Diagnosis not present

## 2015-04-07 DIAGNOSIS — R2689 Other abnormalities of gait and mobility: Secondary | ICD-10-CM | POA: Diagnosis not present

## 2015-04-07 DIAGNOSIS — R296 Repeated falls: Secondary | ICD-10-CM | POA: Diagnosis not present

## 2015-04-08 ENCOUNTER — Encounter: Payer: Self-pay | Admitting: Neurology

## 2015-04-08 ENCOUNTER — Ambulatory Visit (INDEPENDENT_AMBULATORY_CARE_PROVIDER_SITE_OTHER): Payer: Commercial Managed Care - HMO | Admitting: Neurology

## 2015-04-08 VITALS — BP 118/50 | HR 70 | Resp 14 | Ht 61.0 in | Wt 104.0 lb

## 2015-04-08 DIAGNOSIS — R269 Unspecified abnormalities of gait and mobility: Secondary | ICD-10-CM | POA: Diagnosis not present

## 2015-04-08 DIAGNOSIS — R296 Repeated falls: Secondary | ICD-10-CM | POA: Diagnosis not present

## 2015-04-08 DIAGNOSIS — R42 Dizziness and giddiness: Secondary | ICD-10-CM | POA: Diagnosis not present

## 2015-04-08 DIAGNOSIS — R2689 Other abnormalities of gait and mobility: Secondary | ICD-10-CM | POA: Diagnosis not present

## 2015-04-08 NOTE — Progress Notes (Signed)
Subjective:    Patient ID: Sarah Boyd is a 81 y.o. female.  HPI     Interim history:   Sarah Boyd is an 81 year old right-handed woman with an underlying medical history of hypothyroidism, breast cancer, hearing loss, arthritis, asthma, and bilateral cataract repairs, who presents for followup consultation of her gait disturbance, balance problems and recurrent falls. She is accompanied by her niece, Sarah Boyd, today. I last saw her on 11/04/2014 at which time she reported doing better as far as the pelvic pain and sore L hip goes. She moved in with Sarah Boyd in February 2016 after a fall. If she does not rush, per Sarah Boyd, she does okay. She saw orthopedics, [name], and was released, as she was doing better. She had a swallow study with [name] on 08/31/14, which showed mild aspiration with thin liquids. She was not always drinking enough water.  Today, 04/08/2015: she feels okay, fell one time in the bathroom, has not injured herself. She has had intermittent issues with feeling off balance. Sometimes she is scared to walk. She gets more insecure when it started. She uses a rolling walker. She could not use the 2 wheeled walker because of uneven grounds and gravel at her niece's house. She does not always drink enough water.  Previously:     I saw her on 05/26/2014, at which time Sarah Boyd reported that the patient fell about 3 times in the prior 6 months. Thankfully she had not injured herself. She was not using her walker inside the house. She had no loss of consciousness or head injuries. She had gone to the emergency room with cough and mild fever and chest x-ray was negative. She was treated with a nebulizer and prescribed prednisone.  In the interim, she presented to the emergency room on 08/02/2014 after fall. I reviewed the emergency room records. She hit her head but had no loss of consciousness. She had a superficial laceration. She had a head CT without contrast on 08/02/2014 as well  as a CT cervical spine without contrast which I reviewed: No acute traumatic injury to the cervical spine. No acute intracranial abnormalities. In addition, personally reviewed the head CT through the PACS system and agree with the findings.  In the interim, she presented back to the emergency room on 09/08/2014 after another fall. I reviewed the hospital emergency room records. She complained of leg pain on the left and also pelvic pain. CT of the left hip without contrast on 09/08/2014 showed: Mildly comminuted mildly displaced left superior pubic ramus fracture. Nondisplaced left inferior pubic ramus fracture. No extension to the acetabulum.   She was symptomatically treated for pain. Her primary care provider referred her to orthopedics in April 2016.   I saw her on 05/12/2013, at which time she was living by herself and I asked her to get a call alert button. I asked her to use her walker at all times for safety. In the interim, she was seen by our nurse practitioner, [name], on 11/24/2013 at which time she was using a cane more consistently. She had gotten a life alert button. She had not fallen recently.  I saw her on 11/05/2012, in which time I asked her to use her walker at all times. I also advised her to look into a life alert button. I felt, she most likely had a multifactorial gait disorder, d/t a combination of intermittent vertigo, arthritis, CNS atherosclerosis, and orthostatic hypotension. In the interim, she fell on 12/01/2012 and  broke a rib. She fell against the dresser. Her niece goes to see her at least once a week and helps her with groceries. The patient loves to walk and has been using her walker consistently. She has walked to [employer] and to [employer]. She has been through PT.   I first met her on 08/05/2012, at which time she gave a several year history of gait and balance problems. She fell in 2012 and fractured both wrists. Years ago she fell off the porch and hurt her right  shoulder. She has a history of breast cancer on the right, status post right mastectomy and chemotherapy. She was also complaining of lightheadedness upon standing quickly. She has an underlying medical history of hypothyroidism, osteoporosis, chronic lung disease, past history of breast cancer, status post right mastectomy and chemotherapy. At the time of her first visit with me she displayed mild gait insecurity but no frank ataxia. I felt she had multifactorial gait disturbance due to arthritis, cerebrovascular atherosclerosis, orthostatic hypotension, advanced age, and possible intermittent vertigo. I suggested a brain MRI with and without contrast and recommended physical therapy for gait and balance training. She was advised to use meclizine as needed. She has not been using it and was not sure if it was helpful.   Her brain MRI from 08/15/12 showed mild changes of age-appropriate generalized cerebral atrophy and chronic microvascular ischemia.   She lives in a 2 storey house by herself. If she gets up too quickly, she feels off balance and lightheaded. She was in PT in [place], [place], but I do not have a report available.   She recently had a check up with her PCP.    Her Past Medical History Is Significant For: Past Medical History  Diagnosis Date  . Labral tear of shoulder RIGHT SHOULDER  . Asthma   . Hypothyroidism   . History of breast cancer S/P RIGHT MASTECTOMY AND CHEMO 21 YRS AGO (APPROX.  1990)    NO RECURRENCE  . Right shoulder pain   . HOH (hard of hearing) NO AIDS    Her Past Surgical History Is Significant For: Past Surgical History  Procedure Laterality Date  . Excision of right chest mass  12-19-1999    BENIGN  . Mastectomy  1990  (APPROX.)    RIGHT BREAST CANCER -- NO RECURRENCE  . Orif bilateral wrist  2009  . Cataract extraction, bilateral    . Shoulder arthroscopy  12/06/2011    Procedure: ARTHROSCOPY SHOULDER;  Surgeon: Magnus Sinning, MD;  [facility];  Service: Orthopedics;  Laterality: Right;  WITH LABRIAL DEBRIDEMENT AND SAD INTERSCALINE BLOCK    Her Family History Is Significant For: Family History  Problem Relation Age of Onset  . Stroke Mother     Her Social History Is Significant For: Social History   Social History  . Marital Status: Single    Spouse Name: N/A  . Number of Children: 0  . Years of Education: College   Occupational History  . Retired    Social History Main Topics  . Smoking status: Never Smoker   . Smokeless tobacco: Never Used  . Alcohol Use: No  . Drug Use: No  . Sexual Activity: Not Asked   Other Topics Concern  . None   Social History Narrative   Patient lives at home alone.   Caffeine Use; none    Her Allergies Are:  Allergies  Allergen Reactions  . Benadryl [Diphenhydramine Hcl] Other (See  Comments)    ALTERED MENTAL STATIS  . Chocolate Other (See Comments)    Religous belief  :   Her Current Medications Are:  Outpatient Encounter Prescriptions as of 04/08/2015  Medication Sig  . albuterol (PROAIR HFA) 108 (90 BASE) MCG/ACT inhaler Inhale 2 puffs into the lungs every 6 (six) hours as needed for wheezing or shortness of breath.  Marland Kitchen alendronate (FOSAMAX) 70 MG tablet TAKE 1 TABLET BY MOUTH ONCE A WEEK WITH A FULL GLASS OF WATER ON AN EMPTY STOMACH  . Calcium Carbonate-Vitamin D (CALCIUM + D PO) Take 1 tablet by mouth daily.  . Fluticasone-Salmeterol (ADVAIR) 100-50 MCG/DOSE AEPB Inhale 1 puff into the lungs 2 (two) times daily.  Marland Kitchen levothyroxine (SYNTHROID, LEVOTHROID) 25 MCG tablet TAKE 1 TABLET EVERY MORNING ON AN EMPTY STOMACH NEEDS TO BE SEEN  . montelukast (SINGULAIR) 10 MG tablet TAKE 1 TABLET (10 MG TOTAL) BY MOUTH AT BEDTIME.  . [DISCONTINUED] meclizine (ANTIVERT) 25 MG tablet Take 1 tablet (25 mg total) by mouth 3 (three) times daily as needed. (Patient not taking: Reported on 11/27/2014)   No facility-administered encounter medications on file as of  04/08/2015.  :  Review of Systems:  Out of a complete 14 point review of systems, all are reviewed and negative with the exception of these symptoms as listed below:   Review of Systems  Neurological:       Family reports that patient has "good days and bad days", on bad days patient "walks like she is drunk". Patient states that she cannot use walker that was recently prescribed.     Objective:  Neurologic Exam  Physical Exam Physical Examination:   Filed Vitals:   04/08/15 1302  BP: 118/50  Pulse: 70  Resp: 14    General Examination: The patient is a very pleasant 81 y.o. female in no acute distress. She appears frail, but weight is stable. She denies orthostatic symptoms and does not have any vertigo upon change in position.  HEENT: Normocephalic, atraumatic, pupils are equal, round and reactive to light and accommodation. Hearing is mildly impaired bilaterally. She has bilateral cataract repairs. She has a hearing aid on the right. Neck is supple with full range of motion. She has no symptoms of vertigo upon sudden changes in head position. Extraocular tracking is good without nystagmus noted. Speech is clear. Oropharynx examination reveals mild mouth dryness otherwise no significant findings. Tongue protrudes centrally and palate elevates symmetrically. Neck auscultation reveals no carotid bruits.  Chest is clear to auscultation without wheezing or rhonchi noted.  Heart sounds are normal without murmurs, rubs or gallops noted. Abdomen is soft, nontender with normal bowel sounds noted. She has no pitting edema in the distal lower extremities. Skin is warm and dry. I do not appreciate any trophic changes and no joint deformities are noted and no joint swelling. She has mild arthritic changes in her hands.  Neurologically: Mental status: The patient is awake, alert and oriented in all 4 spheres. Her memory, attention, language and knowledge are age appropriate. Cranial nerves are as  described above. Motor exam reveals normal bulk and strength for age. She perhaps has a slight degree of hip flexor weakness bilaterally, unchanged. Reflexes are 1+ throughout including her ankles. Cerebellar testing shows no dysmetria or intention tremor on finger to nose testing and no truncal ataxia. Sensory exam is intact to light touch. Upon standing she has no significant lightheadedness, but she stands slowly. She walks slowly and cautiously. Romberg testing shows minimal  swaying but no corrective steps. She turns slowly and uses a rolling walker.             Assessment and Plan:   In summary, Ms. Kizer is an 81 year old lady with an underlying medical history of hypothyroidism, breast cancer, hearing loss, arthritis, asthma, and bilateral cataract repairs, who presents for followup consultation of her gait disturbance, balance problems and recurrent falls. She fell one time since her last visit. In the recent past, she fell and hit her head and needed staples and she also fell and broke her pelvis. She is using her rolling walker consistently but has had intermittent balance problems. She does not always hydrate well. Her physical exam is stable. I prescribed a 2 wheeled walker but this is difficult to use at her niece's house because there is a lot of gravel and and uneven grounds. Her memory and her other physical and neurological exam appears stable otherwise. She most likely has a multi-factorial gait disturbance d/t a combination of intermittent vertigo, arthritis, CNS atherosclerosis, orthostatic hypotension, dehydration, advancing age, prior falls and overall frailty.  she has had a lower blood pressure today. While she denies lightheadedness, I suggested that she monitor her blood pressure. Her knees has a blood pressure monitor at home and will keep a log. She does not have to use it daily. She is advised to monitor how much fluid she drinks. She may not be drinking enough water. I asked her  to phase out of meclizine because it made her too sleepy. I will see her back routinely in 6 months, sooner if needed. I answered all her questions today and the patient and her niece were in agreement. I spent 25 minutes in total face-to-face time with the patient, more than 50% of which was spent in counseling and coordination of care, reviewing test results, reviewing medication and discussing or reviewing the diagnosis of gait d/o, its prognosis and treatment options.

## 2015-04-08 NOTE — Patient Instructions (Signed)
We will need to monitor your blood pressure, it may be lower than normal and may cause you to feel lightheaded, off-balance.  Try to drink more water. Try the anti-itch cream from Eucerin.  Use your rolling walker at all times.

## 2015-04-09 DIAGNOSIS — R2689 Other abnormalities of gait and mobility: Secondary | ICD-10-CM | POA: Diagnosis not present

## 2015-04-09 DIAGNOSIS — R296 Repeated falls: Secondary | ICD-10-CM | POA: Diagnosis not present

## 2015-04-09 DIAGNOSIS — R42 Dizziness and giddiness: Secondary | ICD-10-CM | POA: Diagnosis not present

## 2015-04-11 IMAGING — CT CT CERVICAL SPINE W/O CM
4 of 6 series · 13 of 33 positions shown, 15 images · non-contrast
Comparison: 12/25/2007

CLINICAL DATA: Initial evaluation of fall today with laceration
right occipital area, history of vertigo, denies loss of
consciousness, personal history of breast cancer

EXAM:
CT HEAD WITHOUT CONTRAST
CT CERVICAL SPINE WITHOUT CONTRAST
TECHNIQUE: Multidetector CT imaging of the head and cervical spine was
performed following the standard protocol without intravenous
contrast. Multiplanar CT image reconstructions of the cervical spine
were also generated.

[Series 5: cervical st 2.0 b31s · axial · 0.29mm/px · z∈[+166,+216]mm · 2 of 77 slices shown]
[im 26/77  bone]
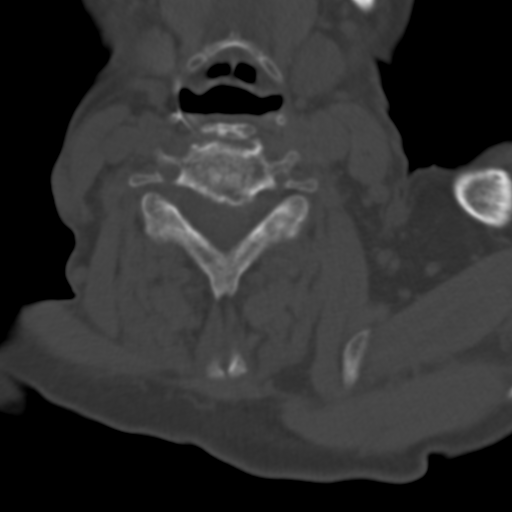
[im 51/77  bone]
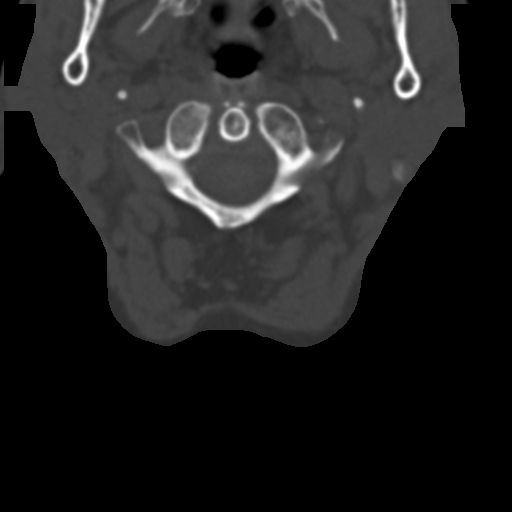

[Series 7: sagittal bone 2.0 · sagittal · 0.28mm/px · 5 of 59 slices shown, 6 images]
[im 20/59  bone]
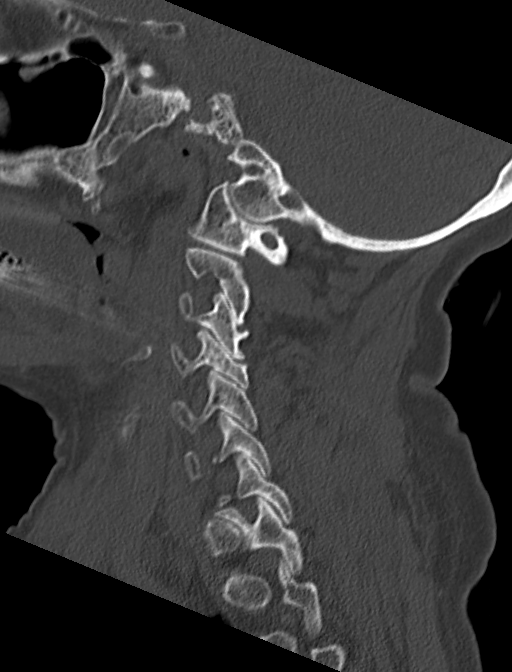
[im 25/59  bone]
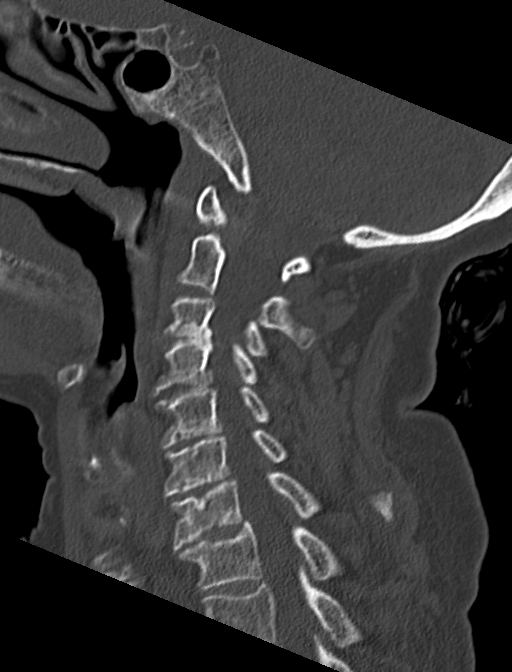
[im 30/59  soft-tissue]
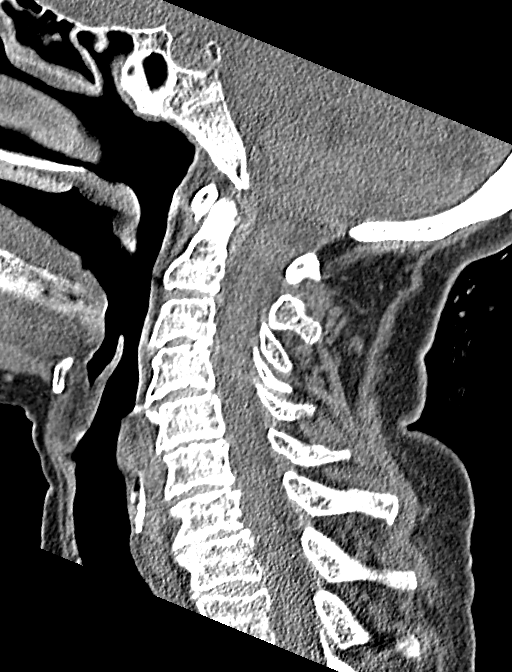
[im 30/59  bone]
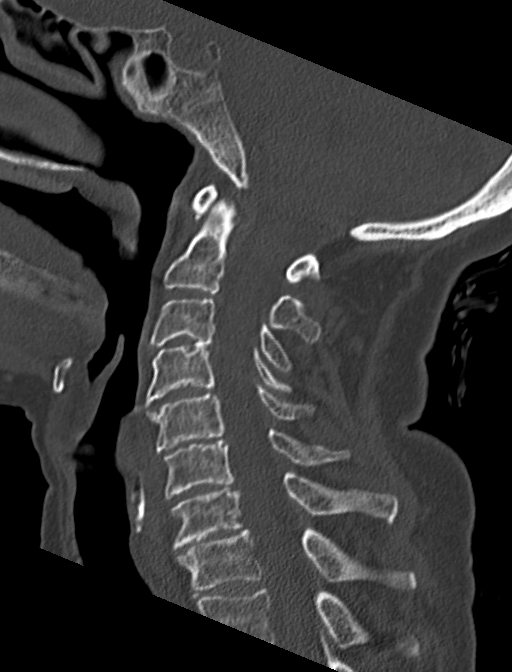
[im 34/59  bone]
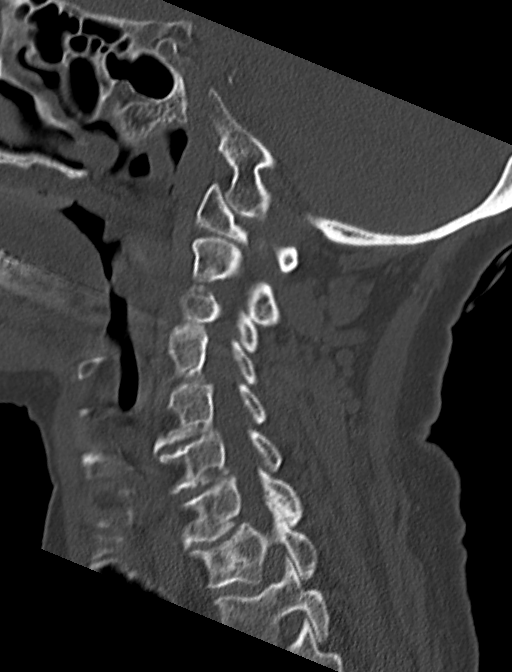
[im 39/59  bone]
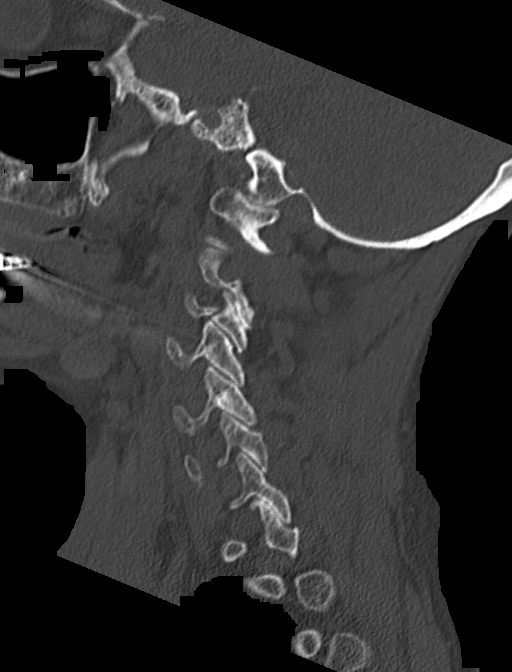

[Series 8: coronal bone 2.0 · coronal · 0.26mm/px · 3 of 54 slices shown]
[im 11/54  bone]
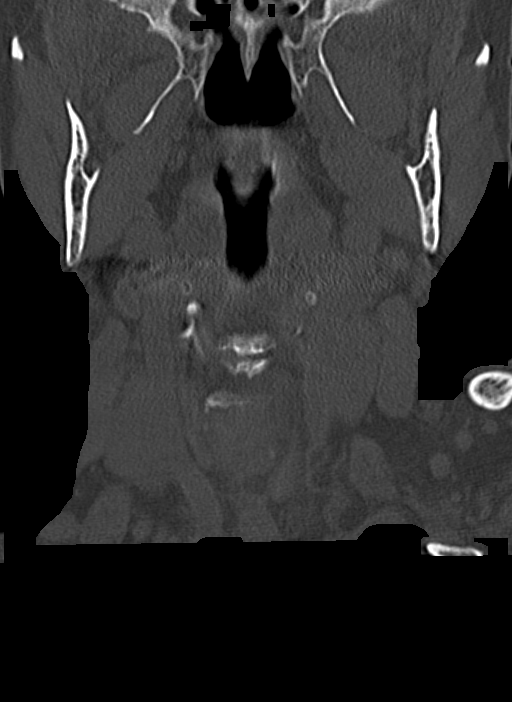
[im 22/54  bone]
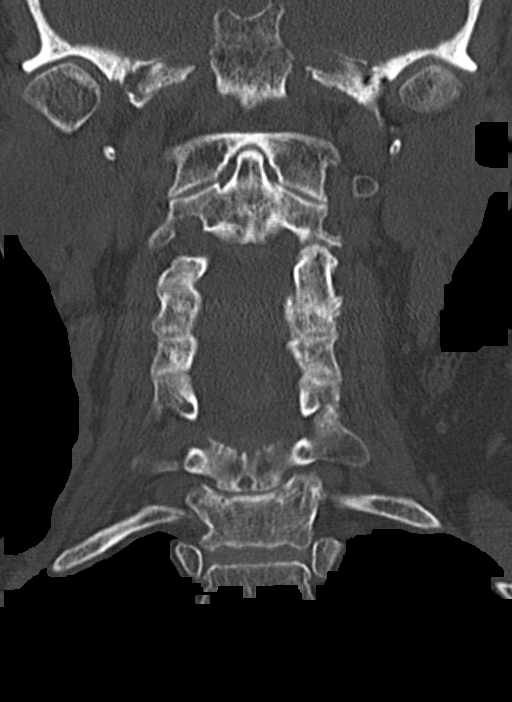
[im 32/54  bone]
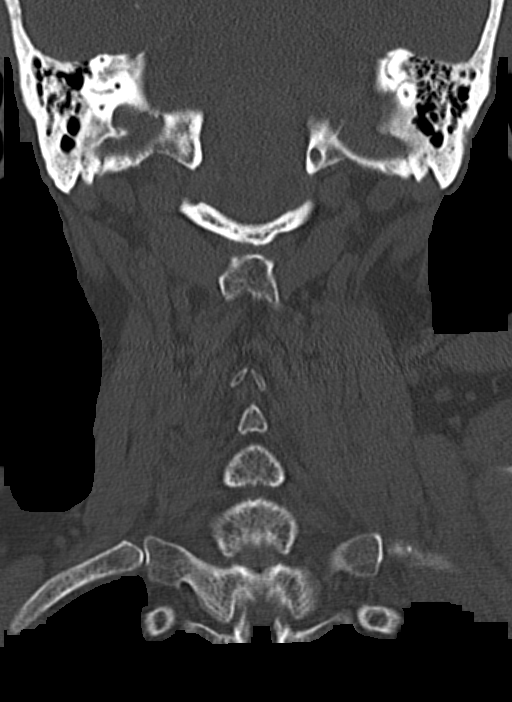

[Series 9: axial bone 2.0 · axial · 0.21mm/px · z∈[+115,+193]mm · 3 of 90 slices shown, 4 images]
[im 23/90  soft-tissue]
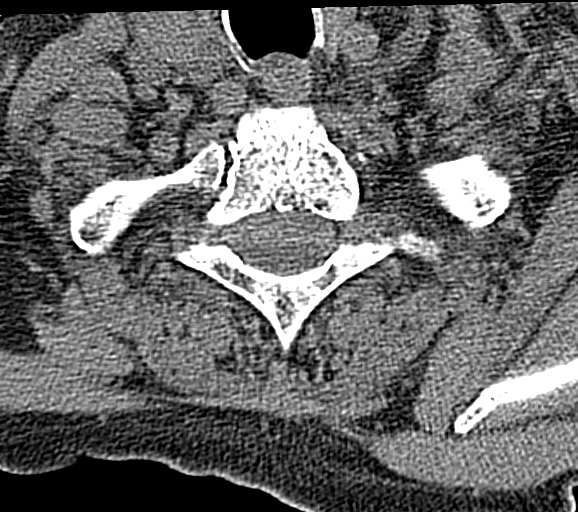
[im 23/90  bone]
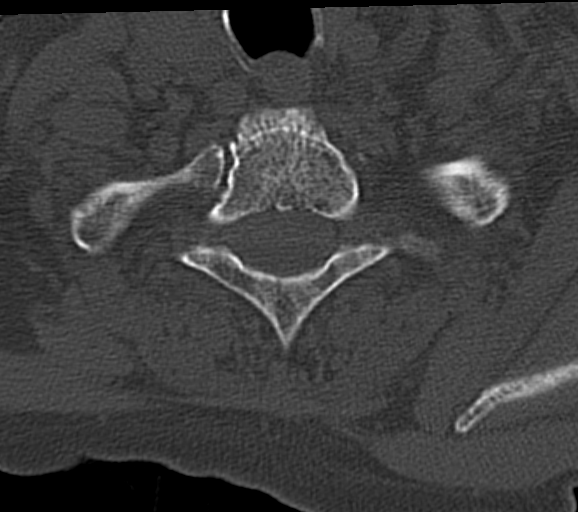
[im 45/90  bone]
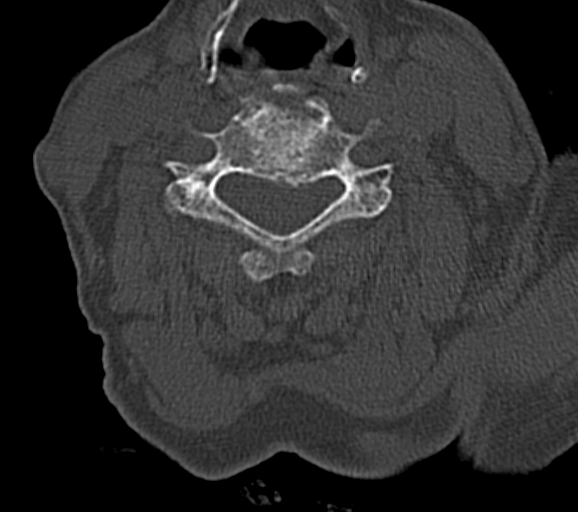
[im 67/90  bone]
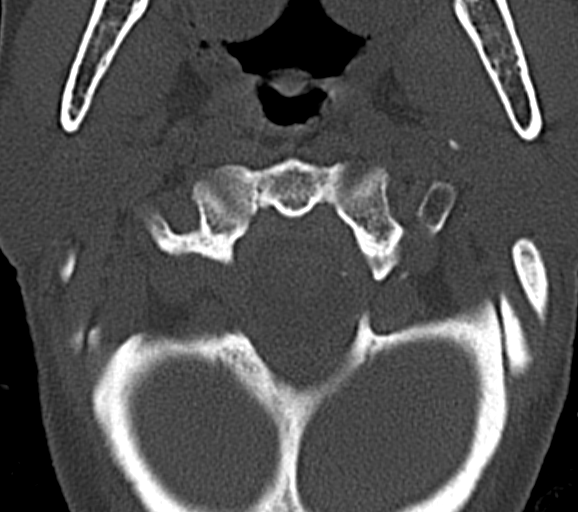

[13 of 33 positions shown; findings below may reference images not displayed]

FINDINGS: CT HEAD FINDINGS

Prominent perivascular space medial right temporal lobe, stable from
prior study. Moderate diffuse atrophy. Mild low attenuation in the
deep white matter. No evidence of vascular territory infarct or
mass. No hydrocephalus. No hemorrhage or extra-axial fluid.
Calvarium is intact.

CT CERVICAL SPINE FINDINGS

No soft tissue abnormalities. No prevertebral swelling. No fracture.
Normal alignment. Significant degenerative disc disease at C4-5,
C5-6, C6-7, and C7-T1. Lung apices clear.
IMPRESSION: No acute traumatic injury to the cervical spine. No acute
intracranial abnormalities.

## 2015-04-12 DIAGNOSIS — R42 Dizziness and giddiness: Secondary | ICD-10-CM | POA: Diagnosis not present

## 2015-04-12 DIAGNOSIS — R296 Repeated falls: Secondary | ICD-10-CM | POA: Diagnosis not present

## 2015-04-12 DIAGNOSIS — R2689 Other abnormalities of gait and mobility: Secondary | ICD-10-CM | POA: Diagnosis not present

## 2015-04-13 DIAGNOSIS — R2689 Other abnormalities of gait and mobility: Secondary | ICD-10-CM | POA: Diagnosis not present

## 2015-04-13 DIAGNOSIS — C50911 Malignant neoplasm of unspecified site of right female breast: Secondary | ICD-10-CM | POA: Diagnosis not present

## 2015-04-13 DIAGNOSIS — R296 Repeated falls: Secondary | ICD-10-CM | POA: Diagnosis not present

## 2015-04-13 DIAGNOSIS — R42 Dizziness and giddiness: Secondary | ICD-10-CM | POA: Diagnosis not present

## 2015-04-14 DIAGNOSIS — R2689 Other abnormalities of gait and mobility: Secondary | ICD-10-CM | POA: Diagnosis not present

## 2015-04-14 DIAGNOSIS — R42 Dizziness and giddiness: Secondary | ICD-10-CM | POA: Diagnosis not present

## 2015-04-14 DIAGNOSIS — R296 Repeated falls: Secondary | ICD-10-CM | POA: Diagnosis not present

## 2015-04-15 ENCOUNTER — Encounter: Payer: Self-pay | Admitting: Family Medicine

## 2015-04-15 ENCOUNTER — Ambulatory Visit (INDEPENDENT_AMBULATORY_CARE_PROVIDER_SITE_OTHER): Payer: Commercial Managed Care - HMO

## 2015-04-15 ENCOUNTER — Ambulatory Visit (INDEPENDENT_AMBULATORY_CARE_PROVIDER_SITE_OTHER): Payer: Commercial Managed Care - HMO | Admitting: Family Medicine

## 2015-04-15 VITALS — BP 136/68 | HR 89 | Temp 97.3°F | Ht 61.0 in | Wt 104.8 lb

## 2015-04-15 DIAGNOSIS — R0789 Other chest pain: Secondary | ICD-10-CM | POA: Diagnosis not present

## 2015-04-15 DIAGNOSIS — R42 Dizziness and giddiness: Secondary | ICD-10-CM | POA: Diagnosis not present

## 2015-04-15 DIAGNOSIS — R2689 Other abnormalities of gait and mobility: Secondary | ICD-10-CM | POA: Diagnosis not present

## 2015-04-15 DIAGNOSIS — R296 Repeated falls: Secondary | ICD-10-CM

## 2015-04-15 MED ORDER — DICLOFENAC SODIUM 1 % TD GEL: 2.0000 g | Freq: Four times a day (QID) | TRANSDERMAL | Status: DC

## 2015-04-15 NOTE — Patient Instructions (Addendum)
Great to meet you! Try 1 tylenol, three times a day (you may also try 2 at a time)  Try voltaren gel for her pain   Keep your appointment next week  Come back if she develops worsening cough, fever, nasuea, loss of appetite, or shortness of breath.   Chest Wall Pain Chest wall pain is pain in or around the bones and muscles of your chest. Sometimes, an injury causes this pain. Sometimes, the cause may not be known. This pain may take several weeks or longer to get better. HOME CARE Pay attention to any changes in your symptoms. Take these actions to help with your pain:  Rest as told by your doctor.  Avoid activities that cause pain. Try not to use your chest, belly (abdominal), or side muscles to lift heavy things.  If directed, apply ice to the painful area:  Put ice in a plastic bag.  Place a towel between your skin and the bag.  Leave the ice on for 20 minutes, 2-3 times per day.  Take over-the-counter and prescription medicines only as told by your doctor.  Do not use tobacco products, including cigarettes, chewing tobacco, and e-cigarettes. If you need help quitting, ask your doctor.  Keep all follow-up visits as told by your doctor. This is important. GET HELP IF:  You have a fever.  Your chest pain gets worse.  You have new symptoms. GET HELP RIGHT AWAY IF:  You feel sick to your stomach (nauseous) or you throw up (vomit).  You feel sweaty or light-headed.  You have a cough with phlegm (sputum) or you cough up blood.  You are short of breath.   This information is not intended to replace advice given to you by your health care provider. Make sure you discuss any questions you have with your health care provider.   Document Released: 11/15/2007 Document Revised: 02/17/2015 Document Reviewed: 08/24/2014 Elsevier Interactive Patient Education [employer].

## 2015-04-15 NOTE — Progress Notes (Signed)
   HPI  Patient presents today here woith rib pain  She fell 2 nights ago landing on her L chest. She denies syncope and cannot describe the circumstances of hjer falling. It hurst to cough but she is breathing without trouble.  She hasnt tried any medications, she has been rubbing alcohol on the area with some improvement.   She denies orthopnea, dyspnea, increased cough, fever, malaise, and concern for currentillness She does have stable dysphagia.   PMH: Smoking status noted ROS: Per HPI  Objective: BP 136/68 mmHg  Pulse 89  Temp(Src) 97.3 F (36.3 C) (Oral)  Ht 5\' 1"  (1.549 m)  Wt 104 lb 12.8 oz (47.537 kg)  BMI 19.81 kg/m2 Gen: NAD, alert, cooperative with exam HEENT: NCAT CV: RRR, good S1/S2, no murmur Resp: non-labored, soft crackles at the L base Abd: SNTND, BS present, no guarding or organomegaly Ext: No edema, warm Neuro: Alert and oriented, No gross deficits Chest wall with no bruising but she does have tenderness to palpation of L chest from sternum to mid axillary line  Exam with female nurse chaperone present.   CXR 04/15/2015- No fracture, L pleural effusion  Assessment and plan:  # Chest wall pain Tenderness to palpation after a fall, likely all MSK pain Tx with tylenol and voltaren gel They have f/u in 1 week  # pleural effusion No clear etiology No clinical signs of CAP or CHF, no smoking hx to raise risk of malignancy.  Plan repeat CXR in 1 month, offer pulm referral if persistnet.     Orders Placed This Encounter  Procedures  . DG Ribs Unilateral W/Chest Left    Standing Status: Future     Number of Occurrences:      Standing Expiration Date: 06/14/2016    Order Specific Question:  Reason for Exam (SYMPTOM  OR DIAGNOSIS REQUIRED)    Answer:  R/o rib Fx    Order Specific Question:  Preferred imaging location?    Answer:  Internal     Laroy Apple, MD [facility] 04/15/2015, 3:25 PM

## 2015-04-16 DIAGNOSIS — R42 Dizziness and giddiness: Secondary | ICD-10-CM | POA: Diagnosis not present

## 2015-04-16 DIAGNOSIS — R0789 Other chest pain: Secondary | ICD-10-CM | POA: Diagnosis not present

## 2015-04-16 DIAGNOSIS — R2689 Other abnormalities of gait and mobility: Secondary | ICD-10-CM | POA: Diagnosis not present

## 2015-04-16 DIAGNOSIS — R296 Repeated falls: Secondary | ICD-10-CM | POA: Diagnosis not present

## 2015-04-16 DIAGNOSIS — R1310 Dysphagia, unspecified: Secondary | ICD-10-CM | POA: Diagnosis not present

## 2015-04-16 DIAGNOSIS — J90 Pleural effusion, not elsewhere classified: Secondary | ICD-10-CM | POA: Diagnosis not present

## 2015-04-16 DIAGNOSIS — W19XXXD Unspecified fall, subsequent encounter: Secondary | ICD-10-CM | POA: Diagnosis not present

## 2015-04-16 NOTE — Addendum Note (Signed)
Addended by: Timmothy Euler on: 04/16/2015 07:56 AM   Modules accepted: Orders

## 2015-04-19 DIAGNOSIS — J90 Pleural effusion, not elsewhere classified: Secondary | ICD-10-CM | POA: Diagnosis not present

## 2015-04-19 DIAGNOSIS — R0789 Other chest pain: Secondary | ICD-10-CM | POA: Diagnosis not present

## 2015-04-19 DIAGNOSIS — R1310 Dysphagia, unspecified: Secondary | ICD-10-CM | POA: Diagnosis not present

## 2015-04-19 DIAGNOSIS — R296 Repeated falls: Secondary | ICD-10-CM | POA: Diagnosis not present

## 2015-04-19 DIAGNOSIS — R42 Dizziness and giddiness: Secondary | ICD-10-CM | POA: Diagnosis not present

## 2015-04-19 DIAGNOSIS — W19XXXD Unspecified fall, subsequent encounter: Secondary | ICD-10-CM | POA: Diagnosis not present

## 2015-04-19 DIAGNOSIS — R2689 Other abnormalities of gait and mobility: Secondary | ICD-10-CM | POA: Diagnosis not present

## 2015-04-20 DIAGNOSIS — W19XXXD Unspecified fall, subsequent encounter: Secondary | ICD-10-CM | POA: Diagnosis not present

## 2015-04-20 DIAGNOSIS — R1310 Dysphagia, unspecified: Secondary | ICD-10-CM | POA: Diagnosis not present

## 2015-04-20 DIAGNOSIS — R42 Dizziness and giddiness: Secondary | ICD-10-CM | POA: Diagnosis not present

## 2015-04-20 DIAGNOSIS — R296 Repeated falls: Secondary | ICD-10-CM | POA: Diagnosis not present

## 2015-04-20 DIAGNOSIS — J90 Pleural effusion, not elsewhere classified: Secondary | ICD-10-CM | POA: Diagnosis not present

## 2015-04-20 DIAGNOSIS — R0789 Other chest pain: Secondary | ICD-10-CM | POA: Diagnosis not present

## 2015-04-20 DIAGNOSIS — R2689 Other abnormalities of gait and mobility: Secondary | ICD-10-CM | POA: Diagnosis not present

## 2015-04-21 DIAGNOSIS — R2689 Other abnormalities of gait and mobility: Secondary | ICD-10-CM | POA: Diagnosis not present

## 2015-04-21 DIAGNOSIS — R42 Dizziness and giddiness: Secondary | ICD-10-CM | POA: Diagnosis not present

## 2015-04-21 DIAGNOSIS — R296 Repeated falls: Secondary | ICD-10-CM | POA: Diagnosis not present

## 2015-04-22 ENCOUNTER — Encounter: Payer: Self-pay | Admitting: Nurse Practitioner

## 2015-04-22 ENCOUNTER — Ambulatory Visit (INDEPENDENT_AMBULATORY_CARE_PROVIDER_SITE_OTHER): Payer: Commercial Managed Care - HMO | Admitting: Nurse Practitioner

## 2015-04-22 VITALS — BP 129/66 | HR 75 | Temp 96.8°F | Ht 61.0 in | Wt 107.0 lb

## 2015-04-22 DIAGNOSIS — E038 Other specified hypothyroidism: Secondary | ICD-10-CM | POA: Diagnosis not present

## 2015-04-22 DIAGNOSIS — E034 Atrophy of thyroid (acquired): Secondary | ICD-10-CM

## 2015-04-22 DIAGNOSIS — Z23 Encounter for immunization: Secondary | ICD-10-CM

## 2015-04-22 DIAGNOSIS — R42 Dizziness and giddiness: Secondary | ICD-10-CM | POA: Diagnosis not present

## 2015-04-22 DIAGNOSIS — J4520 Mild intermittent asthma, uncomplicated: Secondary | ICD-10-CM

## 2015-04-22 DIAGNOSIS — Z139 Encounter for screening, unspecified: Secondary | ICD-10-CM | POA: Diagnosis not present

## 2015-04-22 DIAGNOSIS — M810 Age-related osteoporosis without current pathological fracture: Secondary | ICD-10-CM

## 2015-04-22 DIAGNOSIS — R296 Repeated falls: Secondary | ICD-10-CM | POA: Diagnosis not present

## 2015-04-22 DIAGNOSIS — R2689 Other abnormalities of gait and mobility: Secondary | ICD-10-CM | POA: Diagnosis not present

## 2015-04-22 NOTE — Patient Instructions (Signed)
Osteoporosis  Osteoporosis is the thinning and loss of density in the bones. Osteoporosis makes the bones more brittle, fragile, and likely to break (fracture). Over time, osteoporosis can cause the bones to become so weak that they fracture after a simple fall. The bones most likely to fracture are the bones in the hip, wrist, and spine.  CAUSES   The exact cause is not known.  RISK FACTORS  Anyone can develop osteoporosis. You may be at greater risk if you have a family history of the condition or have poor nutrition. You may also have a higher risk if you are:   · Female.    · 50 years old or older.  · A smoker.  · Not physically active.    · White or Asian.  · Slender.  SIGNS AND SYMPTOMS   A fracture might be the first sign of the disease, especially if it results from a fall or injury that would not usually cause a bone to break. Other signs and symptoms include:   · Low back and neck pain.  · Stooped posture.  · Height loss.  DIAGNOSIS   To make a diagnosis, your health care provider may:  · Take a medical history.  · Perform a physical exam.  · Order tests, such as:    A bone mineral density test.    A dual-energy X-ray absorptiometry test.  TREATMENT   The goal of osteoporosis treatment is to strengthen your bones to reduce your risk of a fracture. Treatment may involve:  · Making lifestyle changes, such as:    Eating a diet rich in calcium.    Doing weight-bearing and muscle-strengthening exercises.    Stopping tobacco use.    Limiting alcohol intake.  · Taking medicine to slow the process of bone loss or to increase bone density.  · Monitoring your levels of calcium and vitamin D.  HOME CARE INSTRUCTIONS  · Include calcium and vitamin D in your diet. Calcium is important for bone health, and vitamin D helps the body absorb calcium.  · Perform weight-bearing and muscle-strengthening exercises as directed by your health care provider.  · Do not use any tobacco products, including cigarettes, chewing  tobacco, and electronic cigarettes. If you need help quitting, ask your health care provider.  · Limit your alcohol intake.  · Take medicines only as directed by your health care provider.  · Keep all follow-up visits as directed by your health care provider. This is important.  · Take precautions at home to lower your risk of falling, such as:    Keeping rooms well lit and clutter free.    Installing safety rails on stairs.    Using rubber mats in the bathroom and other areas that are often wet or slippery.  SEEK IMMEDIATE MEDICAL CARE IF:   You fall or injure yourself.      This information is not intended to replace advice given to you by your health care provider. Make sure you discuss any questions you have with your health care provider.     Document Released: 03/08/2005 Document Revised: 06/19/2014 Document Reviewed: 11/06/2013  Elsevier Interactive Patient Education ©2016 Elsevier Inc.

## 2015-04-22 NOTE — Progress Notes (Signed)
   Subjective:    Patient ID: Sarah Boyd, female    DOB: 04-29-1930, 81 y.o.   MRN: [id number]  HPI  Patient in today for follow up of chronic medical problems. Reports intermittent leg cramps over the past couple of months.   Hypothyroidism Currently on synthroid 28mcg daily- no complaints Asthma Takes singulair and advair daily has not need albuterol in quite sometime. Osteoporosis Fosamax weekly- has some back pain that she takes ultram for.  Review of Systems  Constitutional: Negative.   HENT: Negative.   Respiratory: Negative.   Genitourinary: Negative.   Neurological: Negative.   Psychiatric/Behavioral: Negative.   All other systems reviewed and are negative.      Objective:   Physical Exam  Constitutional: She is oriented to person, place, and time. She appears well-developed and well-nourished.  HENT:  Nose: Nose normal.  Mouth/Throat: Oropharynx is clear and moist.  Eyes: EOM are normal.  Neck: Trachea normal, normal range of motion and full passive range of motion without pain. Neck supple. No JVD present. Carotid bruit is not present. No thyromegaly present.  Cardiovascular: Normal rate, regular rhythm, normal heart sounds and intact distal pulses.  Exam reveals no gallop and no friction rub.   No murmur heard. Pulmonary/Chest: Effort normal and breath sounds normal.  Abdominal: Soft. Bowel sounds are normal. She exhibits no distension and no mass. There is no tenderness.  Musculoskeletal: Normal range of motion.  Patient walking with walker- steady and slow  Lymphadenopathy:    She has no cervical adenopathy.  Neurological: She is alert and oriented to person, place, and time. She has normal reflexes. No cranial nerve deficit.  Skin: Skin is warm and dry.  Psychiatric: She has a normal mood and affect. Her behavior is normal. Judgment and thought content normal.    BP 129/66 mmHg  Pulse 75  Temp(Src) 96.8 F (36 C) (Oral)  Ht $R'5\' 1"'kD$  (1.549 m)  Wt 107  lb (48.535 kg)  BMI 20.23 kg/m2      Assessment & Plan:  1. Hypothyroidism due to acquired atrophy of thyroid Continue with current medications  2. Osteoporosis Weight bearing exercises Continue fosamax  3. Asthma, chronic, mild intermittent, uncomplicated Continue with daily use of advair and singular. Only use albuterol as needed.   4. Screening  - Lipid panel - CMP14+EGFR  Discussed Flu and Pneumonia vaccines at this visit Continue all meds Labs pending Health Maintenance reviewed Diet and exercise encouraged RTO 6 months  Mary-Margaret Hassell Done, FNP

## 2015-04-22 NOTE — Addendum Note (Signed)
Addended by: Rolena Infante on: 04/22/2015 06:27 PM   Modules accepted: Orders

## 2015-04-23 ENCOUNTER — Other Ambulatory Visit: Payer: Self-pay | Admitting: Family

## 2015-04-23 DIAGNOSIS — R2689 Other abnormalities of gait and mobility: Secondary | ICD-10-CM | POA: Diagnosis not present

## 2015-04-23 DIAGNOSIS — W19XXXD Unspecified fall, subsequent encounter: Secondary | ICD-10-CM | POA: Diagnosis not present

## 2015-04-23 DIAGNOSIS — J90 Pleural effusion, not elsewhere classified: Secondary | ICD-10-CM | POA: Diagnosis not present

## 2015-04-23 DIAGNOSIS — R0789 Other chest pain: Secondary | ICD-10-CM | POA: Diagnosis not present

## 2015-04-23 DIAGNOSIS — R296 Repeated falls: Secondary | ICD-10-CM | POA: Diagnosis not present

## 2015-04-23 DIAGNOSIS — R1310 Dysphagia, unspecified: Secondary | ICD-10-CM | POA: Diagnosis not present

## 2015-04-23 DIAGNOSIS — R42 Dizziness and giddiness: Secondary | ICD-10-CM | POA: Diagnosis not present

## 2015-04-23 LAB — CMP14+EGFR
ALT: 13 IU/L (ref 0–32)
AST: 20 IU/L (ref 0–40)
Albumin/Globulin Ratio: 1.6 (ref 1.1–2.5)
Albumin: 4.2 g/dL (ref 3.5–4.7)
Alkaline Phosphatase: 65 IU/L (ref 39–117)
BUN/Creatinine Ratio: 16 (ref 11–26)
BUN: 14 mg/dL (ref 8–27)
Bilirubin Total: <0.2 mg/dL (ref 0.0–1.2)
CALCIUM: 9 mg/dL (ref 8.7–10.3)
CO2: 25 mmol/L (ref 18–29)
CREATININE: 0.89 mg/dL (ref 0.57–1.00)
Chloride: 102 mmol/L (ref 97–106)
GFR, EST AFRICAN AMERICAN: 69 mL/min/{1.73_m2} (ref >59)
GFR, EST NON AFRICAN AMERICAN: 60 mL/min/{1.73_m2} (ref >59)
GLUCOSE: 77 mg/dL (ref 65–99)
Globulin, Total: 2.7 g/dL (ref 1.5–4.5)
Potassium: 4.7 mmol/L (ref 3.5–5.2)
Sodium: 141 mmol/L (ref 136–144)
Total Protein: 6.9 g/dL (ref 6.0–8.5)

## 2015-04-23 LAB — LIPID PANEL
CHOL/HDL RATIO: 2 ratio (ref 0.0–4.4)
Cholesterol, Total: 177 mg/dL (ref 100–199)
HDL: 90 mg/dL (ref >39)
LDL Calculated: 57 mg/dL (ref 0–99)
Triglycerides: 150 mg/dL — ABNORMAL HIGH (ref 0–149)
VLDL Cholesterol Cal: 30 mg/dL (ref 5–40)

## 2015-04-26 DIAGNOSIS — R2689 Other abnormalities of gait and mobility: Secondary | ICD-10-CM | POA: Diagnosis not present

## 2015-04-26 DIAGNOSIS — R296 Repeated falls: Secondary | ICD-10-CM | POA: Diagnosis not present

## 2015-04-26 DIAGNOSIS — R42 Dizziness and giddiness: Secondary | ICD-10-CM | POA: Diagnosis not present

## 2015-04-27 DIAGNOSIS — J90 Pleural effusion, not elsewhere classified: Secondary | ICD-10-CM | POA: Diagnosis not present

## 2015-04-27 DIAGNOSIS — R1310 Dysphagia, unspecified: Secondary | ICD-10-CM | POA: Diagnosis not present

## 2015-04-27 DIAGNOSIS — R2689 Other abnormalities of gait and mobility: Secondary | ICD-10-CM | POA: Diagnosis not present

## 2015-04-27 DIAGNOSIS — W19XXXD Unspecified fall, subsequent encounter: Secondary | ICD-10-CM | POA: Diagnosis not present

## 2015-04-27 DIAGNOSIS — R296 Repeated falls: Secondary | ICD-10-CM | POA: Diagnosis not present

## 2015-04-27 DIAGNOSIS — R0789 Other chest pain: Secondary | ICD-10-CM | POA: Diagnosis not present

## 2015-04-27 DIAGNOSIS — R42 Dizziness and giddiness: Secondary | ICD-10-CM | POA: Diagnosis not present

## 2015-04-28 DIAGNOSIS — R296 Repeated falls: Secondary | ICD-10-CM | POA: Diagnosis not present

## 2015-04-28 DIAGNOSIS — R42 Dizziness and giddiness: Secondary | ICD-10-CM | POA: Diagnosis not present

## 2015-04-28 DIAGNOSIS — R2689 Other abnormalities of gait and mobility: Secondary | ICD-10-CM | POA: Diagnosis not present

## 2015-04-29 DIAGNOSIS — R2689 Other abnormalities of gait and mobility: Secondary | ICD-10-CM | POA: Diagnosis not present

## 2015-04-29 DIAGNOSIS — R1310 Dysphagia, unspecified: Secondary | ICD-10-CM | POA: Diagnosis not present

## 2015-04-29 DIAGNOSIS — J90 Pleural effusion, not elsewhere classified: Secondary | ICD-10-CM | POA: Diagnosis not present

## 2015-04-29 DIAGNOSIS — R0789 Other chest pain: Secondary | ICD-10-CM | POA: Diagnosis not present

## 2015-04-29 DIAGNOSIS — R296 Repeated falls: Secondary | ICD-10-CM | POA: Diagnosis not present

## 2015-04-29 DIAGNOSIS — W19XXXD Unspecified fall, subsequent encounter: Secondary | ICD-10-CM | POA: Diagnosis not present

## 2015-04-29 DIAGNOSIS — R42 Dizziness and giddiness: Secondary | ICD-10-CM | POA: Diagnosis not present

## 2015-04-30 ENCOUNTER — Telehealth: Payer: Self-pay | Admitting: Family Medicine

## 2015-04-30 DIAGNOSIS — R2689 Other abnormalities of gait and mobility: Secondary | ICD-10-CM | POA: Diagnosis not present

## 2015-04-30 DIAGNOSIS — R296 Repeated falls: Secondary | ICD-10-CM | POA: Diagnosis not present

## 2015-04-30 DIAGNOSIS — R42 Dizziness and giddiness: Secondary | ICD-10-CM | POA: Diagnosis not present

## 2015-05-03 DIAGNOSIS — R42 Dizziness and giddiness: Secondary | ICD-10-CM | POA: Diagnosis not present

## 2015-05-03 DIAGNOSIS — R2689 Other abnormalities of gait and mobility: Secondary | ICD-10-CM | POA: Diagnosis not present

## 2015-05-03 DIAGNOSIS — R296 Repeated falls: Secondary | ICD-10-CM | POA: Diagnosis not present

## 2015-05-04 DIAGNOSIS — R1310 Dysphagia, unspecified: Secondary | ICD-10-CM | POA: Diagnosis not present

## 2015-05-04 DIAGNOSIS — R296 Repeated falls: Secondary | ICD-10-CM | POA: Diagnosis not present

## 2015-05-04 DIAGNOSIS — R2689 Other abnormalities of gait and mobility: Secondary | ICD-10-CM | POA: Diagnosis not present

## 2015-05-04 DIAGNOSIS — R0789 Other chest pain: Secondary | ICD-10-CM | POA: Diagnosis not present

## 2015-05-04 DIAGNOSIS — W19XXXD Unspecified fall, subsequent encounter: Secondary | ICD-10-CM | POA: Diagnosis not present

## 2015-05-04 DIAGNOSIS — J90 Pleural effusion, not elsewhere classified: Secondary | ICD-10-CM | POA: Diagnosis not present

## 2015-05-04 DIAGNOSIS — R42 Dizziness and giddiness: Secondary | ICD-10-CM | POA: Diagnosis not present

## 2015-05-05 DIAGNOSIS — R42 Dizziness and giddiness: Secondary | ICD-10-CM | POA: Diagnosis not present

## 2015-05-05 DIAGNOSIS — R2689 Other abnormalities of gait and mobility: Secondary | ICD-10-CM | POA: Diagnosis not present

## 2015-05-05 DIAGNOSIS — J90 Pleural effusion, not elsewhere classified: Secondary | ICD-10-CM | POA: Diagnosis not present

## 2015-05-05 DIAGNOSIS — W19XXXD Unspecified fall, subsequent encounter: Secondary | ICD-10-CM | POA: Diagnosis not present

## 2015-05-05 DIAGNOSIS — R0789 Other chest pain: Secondary | ICD-10-CM | POA: Diagnosis not present

## 2015-05-05 DIAGNOSIS — R1310 Dysphagia, unspecified: Secondary | ICD-10-CM | POA: Diagnosis not present

## 2015-05-05 DIAGNOSIS — R296 Repeated falls: Secondary | ICD-10-CM | POA: Diagnosis not present

## 2015-05-06 DIAGNOSIS — R42 Dizziness and giddiness: Secondary | ICD-10-CM | POA: Diagnosis not present

## 2015-05-06 DIAGNOSIS — R2689 Other abnormalities of gait and mobility: Secondary | ICD-10-CM | POA: Diagnosis not present

## 2015-05-06 DIAGNOSIS — R296 Repeated falls: Secondary | ICD-10-CM | POA: Diagnosis not present

## 2015-05-07 DIAGNOSIS — R0789 Other chest pain: Secondary | ICD-10-CM | POA: Diagnosis not present

## 2015-05-07 DIAGNOSIS — R2689 Other abnormalities of gait and mobility: Secondary | ICD-10-CM | POA: Diagnosis not present

## 2015-05-07 DIAGNOSIS — R1310 Dysphagia, unspecified: Secondary | ICD-10-CM | POA: Diagnosis not present

## 2015-05-07 DIAGNOSIS — R296 Repeated falls: Secondary | ICD-10-CM | POA: Diagnosis not present

## 2015-05-07 DIAGNOSIS — W19XXXD Unspecified fall, subsequent encounter: Secondary | ICD-10-CM | POA: Diagnosis not present

## 2015-05-07 DIAGNOSIS — R42 Dizziness and giddiness: Secondary | ICD-10-CM | POA: Diagnosis not present

## 2015-05-07 DIAGNOSIS — J90 Pleural effusion, not elsewhere classified: Secondary | ICD-10-CM | POA: Diagnosis not present

## 2015-05-10 DIAGNOSIS — R296 Repeated falls: Secondary | ICD-10-CM | POA: Diagnosis not present

## 2015-05-10 DIAGNOSIS — R2689 Other abnormalities of gait and mobility: Secondary | ICD-10-CM | POA: Diagnosis not present

## 2015-05-10 DIAGNOSIS — R42 Dizziness and giddiness: Secondary | ICD-10-CM | POA: Diagnosis not present

## 2015-05-11 DIAGNOSIS — R42 Dizziness and giddiness: Secondary | ICD-10-CM | POA: Diagnosis not present

## 2015-05-11 DIAGNOSIS — R0789 Other chest pain: Secondary | ICD-10-CM | POA: Diagnosis not present

## 2015-05-11 DIAGNOSIS — R1310 Dysphagia, unspecified: Secondary | ICD-10-CM | POA: Diagnosis not present

## 2015-05-11 DIAGNOSIS — R296 Repeated falls: Secondary | ICD-10-CM | POA: Diagnosis not present

## 2015-05-11 DIAGNOSIS — W19XXXD Unspecified fall, subsequent encounter: Secondary | ICD-10-CM | POA: Diagnosis not present

## 2015-05-11 DIAGNOSIS — J90 Pleural effusion, not elsewhere classified: Secondary | ICD-10-CM | POA: Diagnosis not present

## 2015-05-11 DIAGNOSIS — R2689 Other abnormalities of gait and mobility: Secondary | ICD-10-CM | POA: Diagnosis not present

## 2015-05-12 ENCOUNTER — Ambulatory Visit (INDEPENDENT_AMBULATORY_CARE_PROVIDER_SITE_OTHER): Payer: Commercial Managed Care - HMO | Admitting: Family Medicine

## 2015-05-12 DIAGNOSIS — R1310 Dysphagia, unspecified: Secondary | ICD-10-CM

## 2015-05-12 DIAGNOSIS — J90 Pleural effusion, not elsewhere classified: Secondary | ICD-10-CM | POA: Diagnosis not present

## 2015-05-12 DIAGNOSIS — R2689 Other abnormalities of gait and mobility: Secondary | ICD-10-CM | POA: Diagnosis not present

## 2015-05-12 DIAGNOSIS — R42 Dizziness and giddiness: Secondary | ICD-10-CM | POA: Diagnosis not present

## 2015-05-12 DIAGNOSIS — R0789 Other chest pain: Secondary | ICD-10-CM

## 2015-05-12 DIAGNOSIS — R296 Repeated falls: Secondary | ICD-10-CM

## 2015-05-13 DIAGNOSIS — R2689 Other abnormalities of gait and mobility: Secondary | ICD-10-CM | POA: Diagnosis not present

## 2015-05-13 DIAGNOSIS — R42 Dizziness and giddiness: Secondary | ICD-10-CM | POA: Diagnosis not present

## 2015-05-13 DIAGNOSIS — R296 Repeated falls: Secondary | ICD-10-CM | POA: Diagnosis not present

## 2015-05-13 DIAGNOSIS — J90 Pleural effusion, not elsewhere classified: Secondary | ICD-10-CM | POA: Diagnosis not present

## 2015-05-13 DIAGNOSIS — W19XXXD Unspecified fall, subsequent encounter: Secondary | ICD-10-CM | POA: Diagnosis not present

## 2015-05-13 DIAGNOSIS — R1310 Dysphagia, unspecified: Secondary | ICD-10-CM | POA: Diagnosis not present

## 2015-05-13 DIAGNOSIS — R0789 Other chest pain: Secondary | ICD-10-CM | POA: Diagnosis not present

## 2015-05-14 ENCOUNTER — Other Ambulatory Visit: Payer: Commercial Managed Care - HMO

## 2015-05-14 ENCOUNTER — Ambulatory Visit (INDEPENDENT_AMBULATORY_CARE_PROVIDER_SITE_OTHER): Payer: Commercial Managed Care - HMO

## 2015-05-14 DIAGNOSIS — J90 Pleural effusion, not elsewhere classified: Secondary | ICD-10-CM | POA: Diagnosis not present

## 2015-05-14 DIAGNOSIS — R42 Dizziness and giddiness: Secondary | ICD-10-CM | POA: Diagnosis not present

## 2015-05-14 DIAGNOSIS — R0789 Other chest pain: Secondary | ICD-10-CM | POA: Diagnosis not present

## 2015-05-14 DIAGNOSIS — W19XXXD Unspecified fall, subsequent encounter: Secondary | ICD-10-CM | POA: Diagnosis not present

## 2015-05-14 DIAGNOSIS — R2689 Other abnormalities of gait and mobility: Secondary | ICD-10-CM | POA: Diagnosis not present

## 2015-05-14 DIAGNOSIS — R1310 Dysphagia, unspecified: Secondary | ICD-10-CM | POA: Diagnosis not present

## 2015-05-14 DIAGNOSIS — R296 Repeated falls: Secondary | ICD-10-CM | POA: Diagnosis not present

## 2015-05-17 DIAGNOSIS — R296 Repeated falls: Secondary | ICD-10-CM | POA: Diagnosis not present

## 2015-05-17 DIAGNOSIS — R2689 Other abnormalities of gait and mobility: Secondary | ICD-10-CM | POA: Diagnosis not present

## 2015-05-17 DIAGNOSIS — R42 Dizziness and giddiness: Secondary | ICD-10-CM | POA: Diagnosis not present

## 2015-05-18 DIAGNOSIS — R2689 Other abnormalities of gait and mobility: Secondary | ICD-10-CM | POA: Diagnosis not present

## 2015-05-18 DIAGNOSIS — R42 Dizziness and giddiness: Secondary | ICD-10-CM | POA: Diagnosis not present

## 2015-05-18 DIAGNOSIS — R296 Repeated falls: Secondary | ICD-10-CM | POA: Diagnosis not present

## 2015-05-18 IMAGING — DX DG FEMUR 2+V*L*
4 series · 4 of 4 positions shown · non-contrast
Comparison: None.

CLINICAL DATA: 84-year-old female who fell with left lower
extremity pain. Able to bear weight. Initial encounter.

EXAM:
LEFT FEMUR 2 VIEWS

[femur ap (1 of 2)]
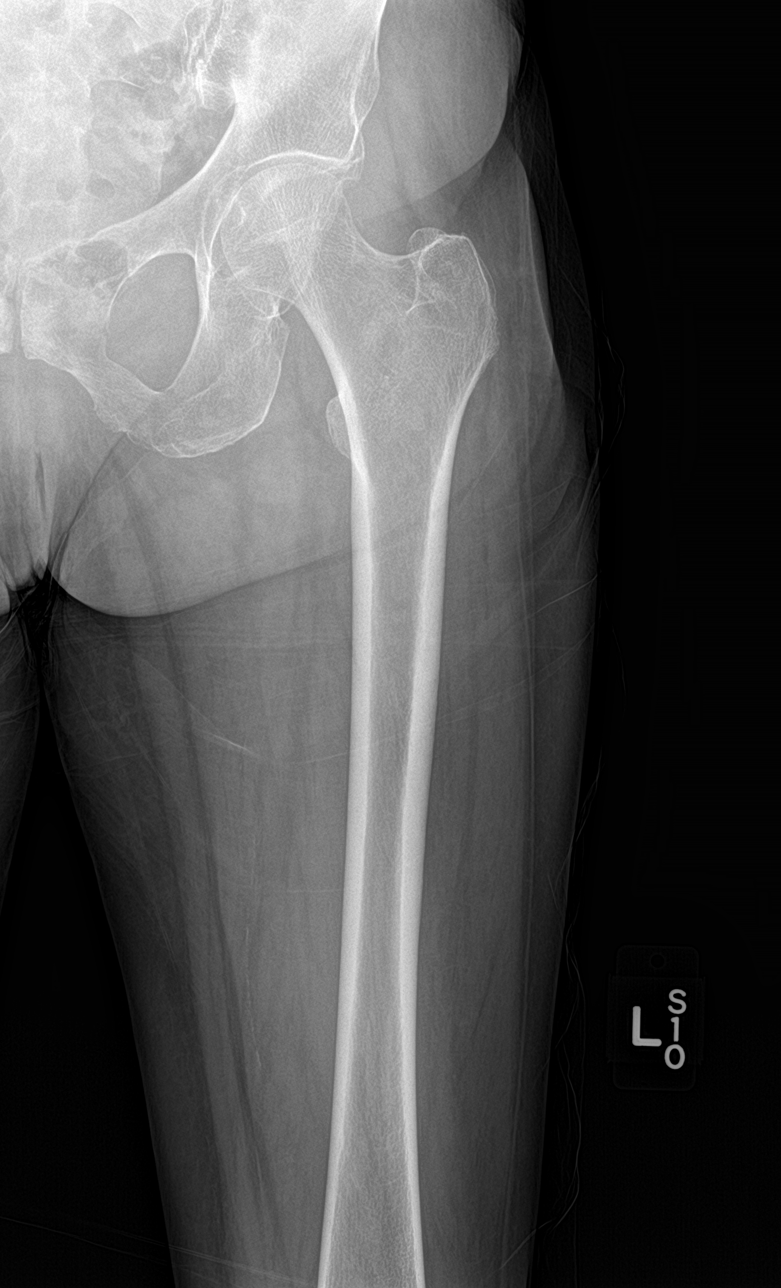

[femur ap (2 of 2)]
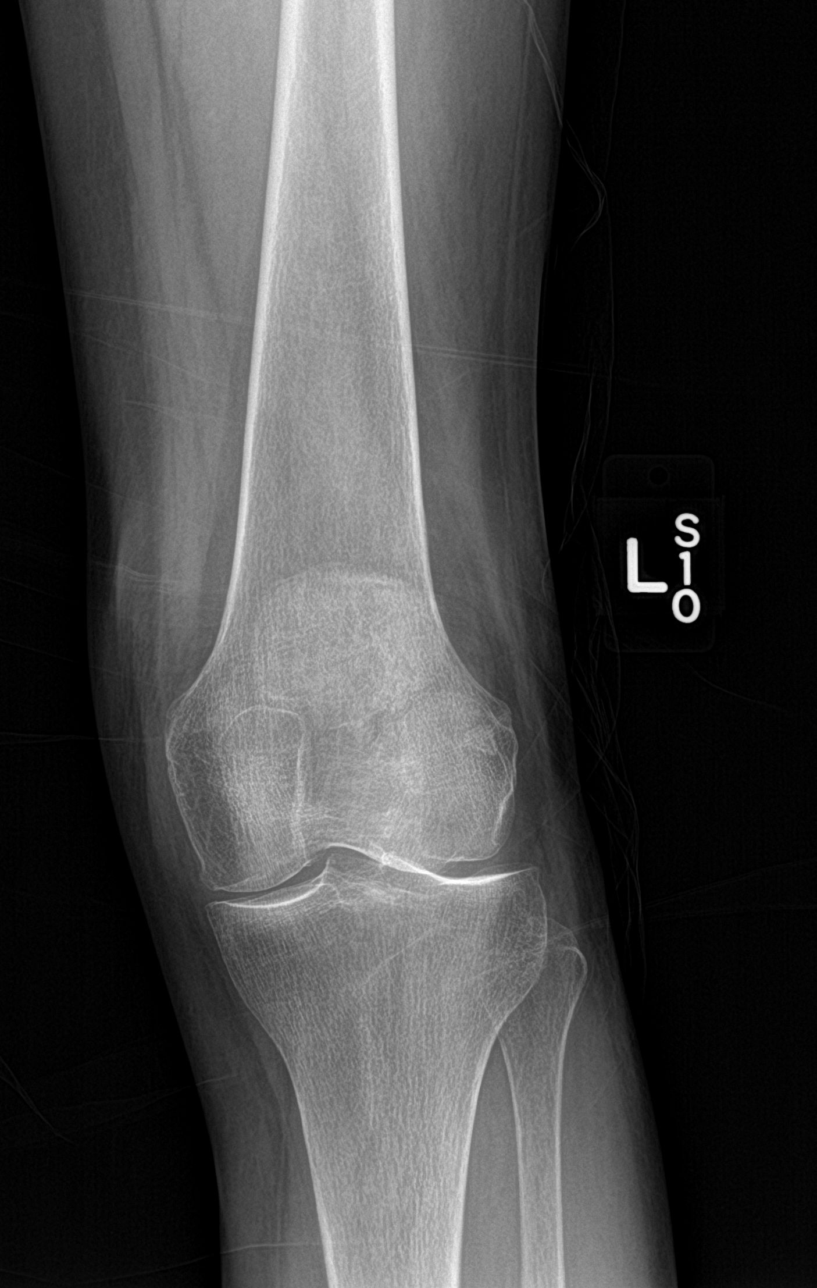

[femur lat (1 of 2)]
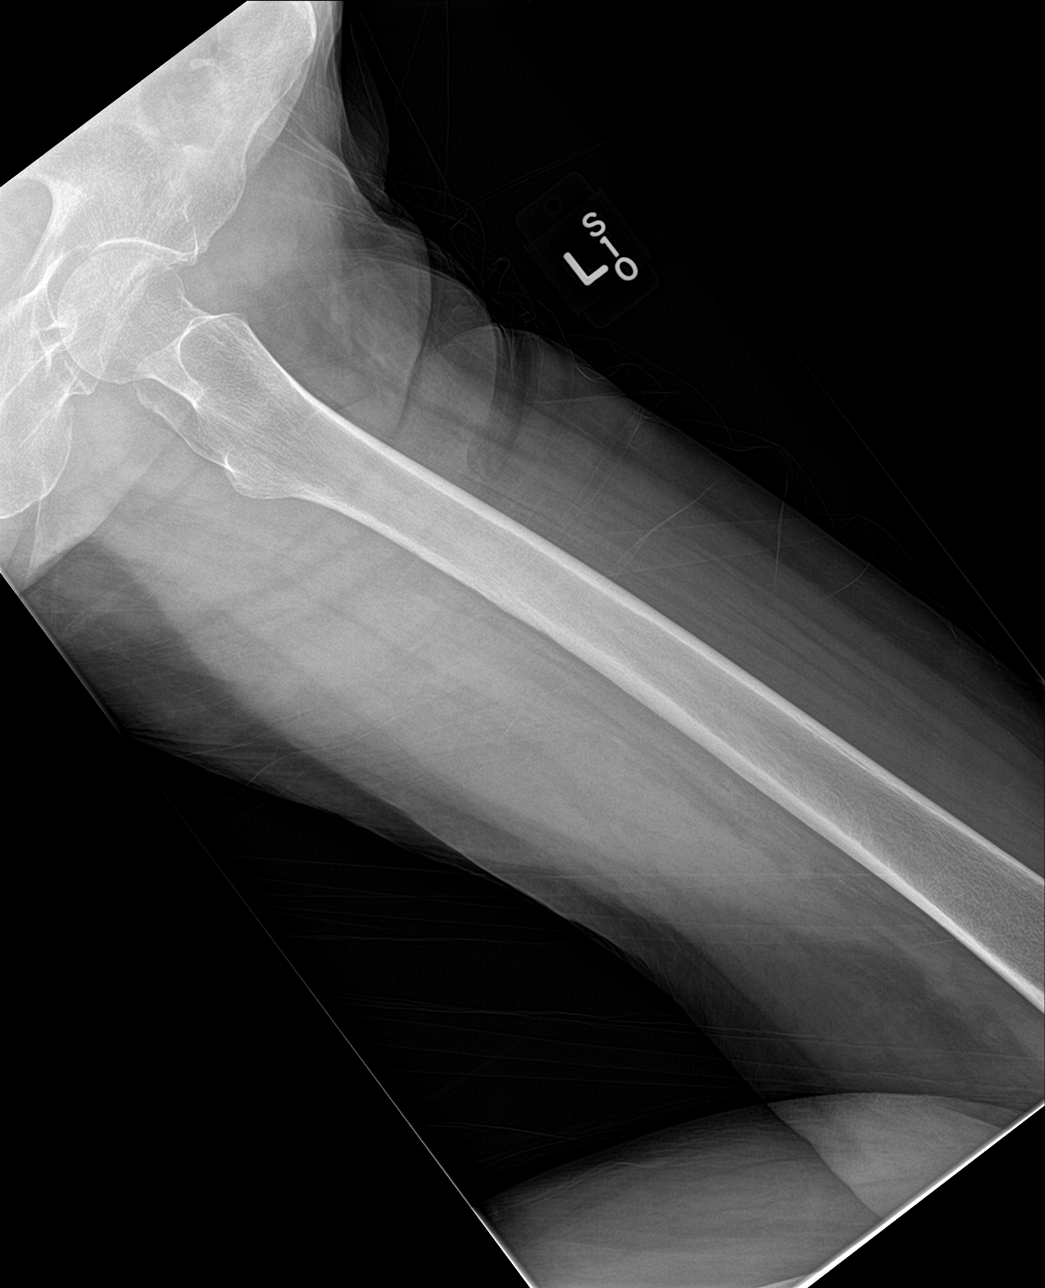

[femur lat (2 of 2)]
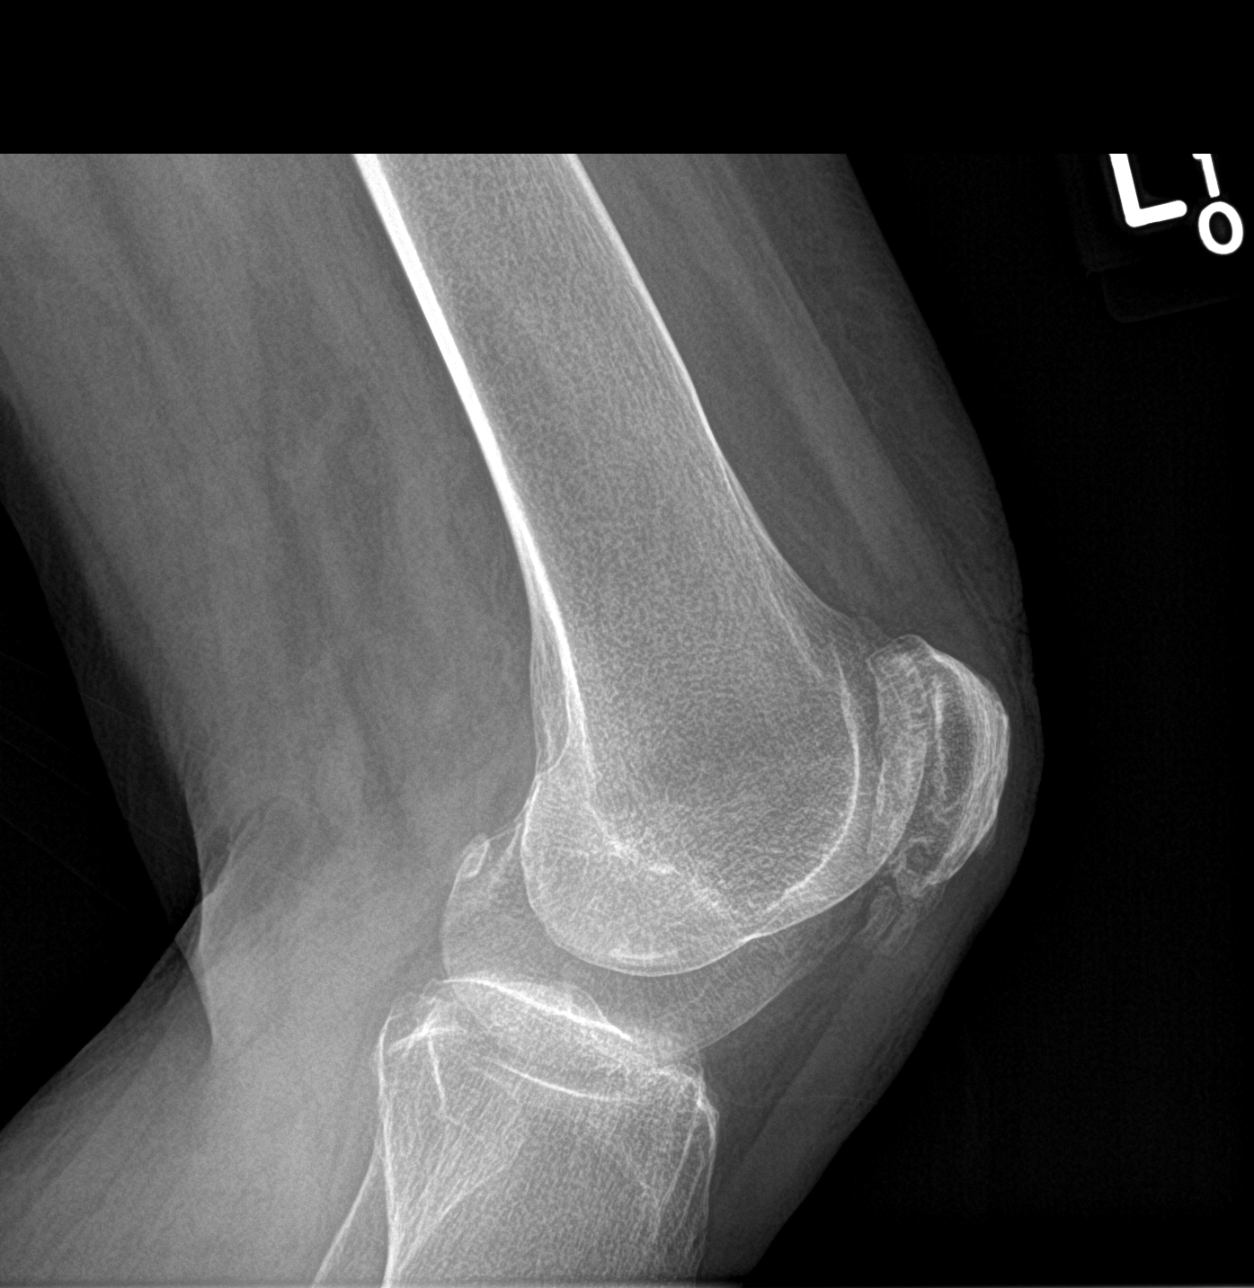

[4 of 4 positions shown; findings below may reference images not displayed]

FINDINGS: Bone mineralization is within normal limits for age. Left femoral
head is normally located. Visible left hemipelvis is intact.
Proximal left femur intact. Left femoral shaft intact. Distal left
femur appears intact with grossly normal alignment at the left knee.
IMPRESSION: No acute fracture or dislocation identified about the left femur.

If occult fracture is suspected or if the patient is unable to
weightbear, MRI is the preferred modality for further evaluation.

## 2015-05-19 DIAGNOSIS — R42 Dizziness and giddiness: Secondary | ICD-10-CM | POA: Diagnosis not present

## 2015-05-19 DIAGNOSIS — R2689 Other abnormalities of gait and mobility: Secondary | ICD-10-CM | POA: Diagnosis not present

## 2015-05-19 DIAGNOSIS — R296 Repeated falls: Secondary | ICD-10-CM | POA: Diagnosis not present

## 2015-05-20 DIAGNOSIS — L299 Pruritus, unspecified: Secondary | ICD-10-CM | POA: Diagnosis not present

## 2015-05-20 DIAGNOSIS — L309 Dermatitis, unspecified: Secondary | ICD-10-CM | POA: Diagnosis not present

## 2015-05-20 DIAGNOSIS — R42 Dizziness and giddiness: Secondary | ICD-10-CM | POA: Diagnosis not present

## 2015-05-20 DIAGNOSIS — R2689 Other abnormalities of gait and mobility: Secondary | ICD-10-CM | POA: Diagnosis not present

## 2015-05-20 DIAGNOSIS — R296 Repeated falls: Secondary | ICD-10-CM | POA: Diagnosis not present

## 2015-05-20 DIAGNOSIS — E858 Other amyloidosis: Secondary | ICD-10-CM | POA: Diagnosis not present

## 2015-05-21 DIAGNOSIS — R42 Dizziness and giddiness: Secondary | ICD-10-CM | POA: Diagnosis not present

## 2015-05-21 DIAGNOSIS — R2689 Other abnormalities of gait and mobility: Secondary | ICD-10-CM | POA: Diagnosis not present

## 2015-05-21 DIAGNOSIS — R296 Repeated falls: Secondary | ICD-10-CM | POA: Diagnosis not present

## 2015-05-24 DIAGNOSIS — R42 Dizziness and giddiness: Secondary | ICD-10-CM | POA: Diagnosis not present

## 2015-05-24 DIAGNOSIS — R296 Repeated falls: Secondary | ICD-10-CM | POA: Diagnosis not present

## 2015-05-24 DIAGNOSIS — R2689 Other abnormalities of gait and mobility: Secondary | ICD-10-CM | POA: Diagnosis not present

## 2015-05-25 DIAGNOSIS — R2689 Other abnormalities of gait and mobility: Secondary | ICD-10-CM | POA: Diagnosis not present

## 2015-05-25 DIAGNOSIS — R296 Repeated falls: Secondary | ICD-10-CM | POA: Diagnosis not present

## 2015-05-25 DIAGNOSIS — R42 Dizziness and giddiness: Secondary | ICD-10-CM | POA: Diagnosis not present

## 2015-05-26 DIAGNOSIS — R2689 Other abnormalities of gait and mobility: Secondary | ICD-10-CM | POA: Diagnosis not present

## 2015-05-26 DIAGNOSIS — R42 Dizziness and giddiness: Secondary | ICD-10-CM | POA: Diagnosis not present

## 2015-05-26 DIAGNOSIS — R296 Repeated falls: Secondary | ICD-10-CM | POA: Diagnosis not present

## 2015-05-27 DIAGNOSIS — R42 Dizziness and giddiness: Secondary | ICD-10-CM | POA: Diagnosis not present

## 2015-05-27 DIAGNOSIS — R296 Repeated falls: Secondary | ICD-10-CM | POA: Diagnosis not present

## 2015-05-27 DIAGNOSIS — R2689 Other abnormalities of gait and mobility: Secondary | ICD-10-CM | POA: Diagnosis not present

## 2015-05-28 DIAGNOSIS — R42 Dizziness and giddiness: Secondary | ICD-10-CM | POA: Diagnosis not present

## 2015-05-28 DIAGNOSIS — R2689 Other abnormalities of gait and mobility: Secondary | ICD-10-CM | POA: Diagnosis not present

## 2015-05-28 DIAGNOSIS — R296 Repeated falls: Secondary | ICD-10-CM | POA: Diagnosis not present

## 2015-05-31 DIAGNOSIS — R2689 Other abnormalities of gait and mobility: Secondary | ICD-10-CM | POA: Diagnosis not present

## 2015-05-31 DIAGNOSIS — R296 Repeated falls: Secondary | ICD-10-CM | POA: Diagnosis not present

## 2015-05-31 DIAGNOSIS — R42 Dizziness and giddiness: Secondary | ICD-10-CM | POA: Diagnosis not present

## 2015-06-01 DIAGNOSIS — R2689 Other abnormalities of gait and mobility: Secondary | ICD-10-CM | POA: Diagnosis not present

## 2015-06-01 DIAGNOSIS — R42 Dizziness and giddiness: Secondary | ICD-10-CM | POA: Diagnosis not present

## 2015-06-01 DIAGNOSIS — R296 Repeated falls: Secondary | ICD-10-CM | POA: Diagnosis not present

## 2015-06-02 DIAGNOSIS — R296 Repeated falls: Secondary | ICD-10-CM | POA: Diagnosis not present

## 2015-06-02 DIAGNOSIS — R2689 Other abnormalities of gait and mobility: Secondary | ICD-10-CM | POA: Diagnosis not present

## 2015-06-02 DIAGNOSIS — R42 Dizziness and giddiness: Secondary | ICD-10-CM | POA: Diagnosis not present

## 2015-06-03 DIAGNOSIS — R2689 Other abnormalities of gait and mobility: Secondary | ICD-10-CM | POA: Diagnosis not present

## 2015-06-03 DIAGNOSIS — R42 Dizziness and giddiness: Secondary | ICD-10-CM | POA: Diagnosis not present

## 2015-06-03 DIAGNOSIS — R296 Repeated falls: Secondary | ICD-10-CM | POA: Diagnosis not present

## 2015-06-04 DIAGNOSIS — R42 Dizziness and giddiness: Secondary | ICD-10-CM | POA: Diagnosis not present

## 2015-06-04 DIAGNOSIS — R296 Repeated falls: Secondary | ICD-10-CM | POA: Diagnosis not present

## 2015-06-04 DIAGNOSIS — R2689 Other abnormalities of gait and mobility: Secondary | ICD-10-CM | POA: Diagnosis not present

## 2015-06-05 ENCOUNTER — Other Ambulatory Visit: Payer: Self-pay | Admitting: Nurse Practitioner

## 2015-06-07 DIAGNOSIS — R2689 Other abnormalities of gait and mobility: Secondary | ICD-10-CM | POA: Diagnosis not present

## 2015-06-07 DIAGNOSIS — R296 Repeated falls: Secondary | ICD-10-CM | POA: Diagnosis not present

## 2015-06-07 DIAGNOSIS — R42 Dizziness and giddiness: Secondary | ICD-10-CM | POA: Diagnosis not present

## 2015-06-08 DIAGNOSIS — R42 Dizziness and giddiness: Secondary | ICD-10-CM | POA: Diagnosis not present

## 2015-06-08 DIAGNOSIS — R296 Repeated falls: Secondary | ICD-10-CM | POA: Diagnosis not present

## 2015-06-08 DIAGNOSIS — R2689 Other abnormalities of gait and mobility: Secondary | ICD-10-CM | POA: Diagnosis not present

## 2015-06-09 DIAGNOSIS — R2689 Other abnormalities of gait and mobility: Secondary | ICD-10-CM | POA: Diagnosis not present

## 2015-06-09 DIAGNOSIS — R296 Repeated falls: Secondary | ICD-10-CM | POA: Diagnosis not present

## 2015-06-09 DIAGNOSIS — R42 Dizziness and giddiness: Secondary | ICD-10-CM | POA: Diagnosis not present

## 2015-06-10 DIAGNOSIS — R2689 Other abnormalities of gait and mobility: Secondary | ICD-10-CM | POA: Diagnosis not present

## 2015-06-10 DIAGNOSIS — E859 Amyloidosis, unspecified: Secondary | ICD-10-CM | POA: Diagnosis not present

## 2015-06-10 DIAGNOSIS — R42 Dizziness and giddiness: Secondary | ICD-10-CM | POA: Diagnosis not present

## 2015-06-10 DIAGNOSIS — R296 Repeated falls: Secondary | ICD-10-CM | POA: Diagnosis not present

## 2015-06-11 DIAGNOSIS — R42 Dizziness and giddiness: Secondary | ICD-10-CM | POA: Diagnosis not present

## 2015-06-11 DIAGNOSIS — R296 Repeated falls: Secondary | ICD-10-CM | POA: Diagnosis not present

## 2015-06-11 DIAGNOSIS — R2689 Other abnormalities of gait and mobility: Secondary | ICD-10-CM | POA: Diagnosis not present

## 2015-06-14 DIAGNOSIS — R42 Dizziness and giddiness: Secondary | ICD-10-CM | POA: Diagnosis not present

## 2015-06-14 DIAGNOSIS — R296 Repeated falls: Secondary | ICD-10-CM | POA: Diagnosis not present

## 2015-06-14 DIAGNOSIS — R2689 Other abnormalities of gait and mobility: Secondary | ICD-10-CM | POA: Diagnosis not present

## 2015-06-15 DIAGNOSIS — R42 Dizziness and giddiness: Secondary | ICD-10-CM | POA: Diagnosis not present

## 2015-06-15 DIAGNOSIS — R296 Repeated falls: Secondary | ICD-10-CM | POA: Diagnosis not present

## 2015-06-15 DIAGNOSIS — R2689 Other abnormalities of gait and mobility: Secondary | ICD-10-CM | POA: Diagnosis not present

## 2015-06-16 DIAGNOSIS — R42 Dizziness and giddiness: Secondary | ICD-10-CM | POA: Diagnosis not present

## 2015-06-16 DIAGNOSIS — R296 Repeated falls: Secondary | ICD-10-CM | POA: Diagnosis not present

## 2015-06-16 DIAGNOSIS — R2689 Other abnormalities of gait and mobility: Secondary | ICD-10-CM | POA: Diagnosis not present

## 2015-06-17 DIAGNOSIS — R2689 Other abnormalities of gait and mobility: Secondary | ICD-10-CM | POA: Diagnosis not present

## 2015-06-17 DIAGNOSIS — R42 Dizziness and giddiness: Secondary | ICD-10-CM | POA: Diagnosis not present

## 2015-06-17 DIAGNOSIS — R296 Repeated falls: Secondary | ICD-10-CM | POA: Diagnosis not present

## 2015-06-18 DIAGNOSIS — R2689 Other abnormalities of gait and mobility: Secondary | ICD-10-CM | POA: Diagnosis not present

## 2015-06-18 DIAGNOSIS — R296 Repeated falls: Secondary | ICD-10-CM | POA: Diagnosis not present

## 2015-06-18 DIAGNOSIS — R42 Dizziness and giddiness: Secondary | ICD-10-CM | POA: Diagnosis not present

## 2015-06-21 DIAGNOSIS — R42 Dizziness and giddiness: Secondary | ICD-10-CM | POA: Diagnosis not present

## 2015-06-21 DIAGNOSIS — R2689 Other abnormalities of gait and mobility: Secondary | ICD-10-CM | POA: Diagnosis not present

## 2015-06-21 DIAGNOSIS — R296 Repeated falls: Secondary | ICD-10-CM | POA: Diagnosis not present

## 2015-06-22 DIAGNOSIS — R2689 Other abnormalities of gait and mobility: Secondary | ICD-10-CM | POA: Diagnosis not present

## 2015-06-22 DIAGNOSIS — R296 Repeated falls: Secondary | ICD-10-CM | POA: Diagnosis not present

## 2015-06-22 DIAGNOSIS — R42 Dizziness and giddiness: Secondary | ICD-10-CM | POA: Diagnosis not present

## 2015-06-23 DIAGNOSIS — R42 Dizziness and giddiness: Secondary | ICD-10-CM | POA: Diagnosis not present

## 2015-06-23 DIAGNOSIS — R296 Repeated falls: Secondary | ICD-10-CM | POA: Diagnosis not present

## 2015-06-23 DIAGNOSIS — R2689 Other abnormalities of gait and mobility: Secondary | ICD-10-CM | POA: Diagnosis not present

## 2015-06-24 DIAGNOSIS — R2689 Other abnormalities of gait and mobility: Secondary | ICD-10-CM | POA: Diagnosis not present

## 2015-06-24 DIAGNOSIS — R42 Dizziness and giddiness: Secondary | ICD-10-CM | POA: Diagnosis not present

## 2015-06-24 DIAGNOSIS — R296 Repeated falls: Secondary | ICD-10-CM | POA: Diagnosis not present

## 2015-06-25 DIAGNOSIS — R2689 Other abnormalities of gait and mobility: Secondary | ICD-10-CM | POA: Diagnosis not present

## 2015-06-25 DIAGNOSIS — R296 Repeated falls: Secondary | ICD-10-CM | POA: Diagnosis not present

## 2015-06-25 DIAGNOSIS — R42 Dizziness and giddiness: Secondary | ICD-10-CM | POA: Diagnosis not present

## 2015-06-28 DIAGNOSIS — R2689 Other abnormalities of gait and mobility: Secondary | ICD-10-CM | POA: Diagnosis not present

## 2015-06-28 DIAGNOSIS — R42 Dizziness and giddiness: Secondary | ICD-10-CM | POA: Diagnosis not present

## 2015-06-28 DIAGNOSIS — R296 Repeated falls: Secondary | ICD-10-CM | POA: Diagnosis not present

## 2015-06-29 DIAGNOSIS — R2689 Other abnormalities of gait and mobility: Secondary | ICD-10-CM | POA: Diagnosis not present

## 2015-06-29 DIAGNOSIS — R296 Repeated falls: Secondary | ICD-10-CM | POA: Diagnosis not present

## 2015-06-29 DIAGNOSIS — R42 Dizziness and giddiness: Secondary | ICD-10-CM | POA: Diagnosis not present

## 2015-06-30 DIAGNOSIS — R296 Repeated falls: Secondary | ICD-10-CM | POA: Diagnosis not present

## 2015-06-30 DIAGNOSIS — R42 Dizziness and giddiness: Secondary | ICD-10-CM | POA: Diagnosis not present

## 2015-06-30 DIAGNOSIS — R2689 Other abnormalities of gait and mobility: Secondary | ICD-10-CM | POA: Diagnosis not present

## 2015-07-01 DIAGNOSIS — R42 Dizziness and giddiness: Secondary | ICD-10-CM | POA: Diagnosis not present

## 2015-07-01 DIAGNOSIS — R296 Repeated falls: Secondary | ICD-10-CM | POA: Diagnosis not present

## 2015-07-01 DIAGNOSIS — R2689 Other abnormalities of gait and mobility: Secondary | ICD-10-CM | POA: Diagnosis not present

## 2015-07-02 DIAGNOSIS — R42 Dizziness and giddiness: Secondary | ICD-10-CM | POA: Diagnosis not present

## 2015-07-02 DIAGNOSIS — R2689 Other abnormalities of gait and mobility: Secondary | ICD-10-CM | POA: Diagnosis not present

## 2015-07-02 DIAGNOSIS — R296 Repeated falls: Secondary | ICD-10-CM | POA: Diagnosis not present

## 2015-07-05 DIAGNOSIS — R296 Repeated falls: Secondary | ICD-10-CM | POA: Diagnosis not present

## 2015-07-05 DIAGNOSIS — R42 Dizziness and giddiness: Secondary | ICD-10-CM | POA: Diagnosis not present

## 2015-07-05 DIAGNOSIS — R2689 Other abnormalities of gait and mobility: Secondary | ICD-10-CM | POA: Diagnosis not present

## 2015-07-06 ENCOUNTER — Telehealth: Payer: Self-pay | Admitting: Family Medicine

## 2015-07-06 ENCOUNTER — Other Ambulatory Visit: Payer: Self-pay | Admitting: Nurse Practitioner

## 2015-07-06 DIAGNOSIS — R2689 Other abnormalities of gait and mobility: Secondary | ICD-10-CM | POA: Diagnosis not present

## 2015-07-06 DIAGNOSIS — H9193 Unspecified hearing loss, bilateral: Secondary | ICD-10-CM

## 2015-07-06 DIAGNOSIS — R296 Repeated falls: Secondary | ICD-10-CM | POA: Diagnosis not present

## 2015-07-06 DIAGNOSIS — R42 Dizziness and giddiness: Secondary | ICD-10-CM | POA: Diagnosis not present

## 2015-07-06 NOTE — Telephone Encounter (Signed)
Niece states she needs to see ENT for the audiologist to do a hearing test. They do it once a year. For the neurologist in [place] she is currently seeing one in [place] for dizziness and they want a second opinion

## 2015-07-06 NOTE — Telephone Encounter (Signed)
i need to know what she needs referral for in order to do them

## 2015-07-06 NOTE — Telephone Encounter (Signed)
Please review and advise.

## 2015-07-07 DIAGNOSIS — R42 Dizziness and giddiness: Secondary | ICD-10-CM | POA: Diagnosis not present

## 2015-07-07 DIAGNOSIS — R2689 Other abnormalities of gait and mobility: Secondary | ICD-10-CM | POA: Diagnosis not present

## 2015-07-07 DIAGNOSIS — R296 Repeated falls: Secondary | ICD-10-CM | POA: Diagnosis not present

## 2015-07-08 DIAGNOSIS — R296 Repeated falls: Secondary | ICD-10-CM | POA: Diagnosis not present

## 2015-07-08 DIAGNOSIS — R42 Dizziness and giddiness: Secondary | ICD-10-CM | POA: Diagnosis not present

## 2015-07-08 DIAGNOSIS — R2689 Other abnormalities of gait and mobility: Secondary | ICD-10-CM | POA: Diagnosis not present

## 2015-07-09 DIAGNOSIS — R42 Dizziness and giddiness: Secondary | ICD-10-CM | POA: Diagnosis not present

## 2015-07-09 DIAGNOSIS — R296 Repeated falls: Secondary | ICD-10-CM | POA: Diagnosis not present

## 2015-07-09 DIAGNOSIS — R2689 Other abnormalities of gait and mobility: Secondary | ICD-10-CM | POA: Diagnosis not present

## 2015-07-12 DIAGNOSIS — R296 Repeated falls: Secondary | ICD-10-CM | POA: Diagnosis not present

## 2015-07-12 DIAGNOSIS — R42 Dizziness and giddiness: Secondary | ICD-10-CM | POA: Diagnosis not present

## 2015-07-12 DIAGNOSIS — R2689 Other abnormalities of gait and mobility: Secondary | ICD-10-CM | POA: Diagnosis not present

## 2015-07-13 DIAGNOSIS — R2689 Other abnormalities of gait and mobility: Secondary | ICD-10-CM | POA: Diagnosis not present

## 2015-07-13 DIAGNOSIS — R296 Repeated falls: Secondary | ICD-10-CM | POA: Diagnosis not present

## 2015-07-13 DIAGNOSIS — R42 Dizziness and giddiness: Secondary | ICD-10-CM | POA: Diagnosis not present

## 2015-07-14 DIAGNOSIS — R2689 Other abnormalities of gait and mobility: Secondary | ICD-10-CM | POA: Diagnosis not present

## 2015-07-14 DIAGNOSIS — R42 Dizziness and giddiness: Secondary | ICD-10-CM | POA: Diagnosis not present

## 2015-07-14 DIAGNOSIS — R296 Repeated falls: Secondary | ICD-10-CM | POA: Diagnosis not present

## 2015-07-15 DIAGNOSIS — R296 Repeated falls: Secondary | ICD-10-CM | POA: Diagnosis not present

## 2015-07-15 DIAGNOSIS — R42 Dizziness and giddiness: Secondary | ICD-10-CM | POA: Diagnosis not present

## 2015-07-15 DIAGNOSIS — R2689 Other abnormalities of gait and mobility: Secondary | ICD-10-CM | POA: Diagnosis not present

## 2015-07-16 DIAGNOSIS — R296 Repeated falls: Secondary | ICD-10-CM | POA: Diagnosis not present

## 2015-07-16 DIAGNOSIS — R2689 Other abnormalities of gait and mobility: Secondary | ICD-10-CM | POA: Diagnosis not present

## 2015-07-16 DIAGNOSIS — R42 Dizziness and giddiness: Secondary | ICD-10-CM | POA: Diagnosis not present

## 2015-07-19 DIAGNOSIS — R2689 Other abnormalities of gait and mobility: Secondary | ICD-10-CM | POA: Diagnosis not present

## 2015-07-19 DIAGNOSIS — R296 Repeated falls: Secondary | ICD-10-CM | POA: Diagnosis not present

## 2015-07-19 DIAGNOSIS — R42 Dizziness and giddiness: Secondary | ICD-10-CM | POA: Diagnosis not present

## 2015-07-20 DIAGNOSIS — R42 Dizziness and giddiness: Secondary | ICD-10-CM | POA: Diagnosis not present

## 2015-07-20 DIAGNOSIS — R296 Repeated falls: Secondary | ICD-10-CM | POA: Diagnosis not present

## 2015-07-20 DIAGNOSIS — R2689 Other abnormalities of gait and mobility: Secondary | ICD-10-CM | POA: Diagnosis not present

## 2015-07-21 DIAGNOSIS — R2689 Other abnormalities of gait and mobility: Secondary | ICD-10-CM | POA: Diagnosis not present

## 2015-07-21 DIAGNOSIS — R42 Dizziness and giddiness: Secondary | ICD-10-CM | POA: Diagnosis not present

## 2015-07-21 DIAGNOSIS — R296 Repeated falls: Secondary | ICD-10-CM | POA: Diagnosis not present

## 2015-07-22 DIAGNOSIS — R2689 Other abnormalities of gait and mobility: Secondary | ICD-10-CM | POA: Diagnosis not present

## 2015-07-22 DIAGNOSIS — R42 Dizziness and giddiness: Secondary | ICD-10-CM | POA: Diagnosis not present

## 2015-07-22 DIAGNOSIS — R296 Repeated falls: Secondary | ICD-10-CM | POA: Diagnosis not present

## 2015-07-23 DIAGNOSIS — R2689 Other abnormalities of gait and mobility: Secondary | ICD-10-CM | POA: Diagnosis not present

## 2015-07-23 DIAGNOSIS — R296 Repeated falls: Secondary | ICD-10-CM | POA: Diagnosis not present

## 2015-07-23 DIAGNOSIS — R42 Dizziness and giddiness: Secondary | ICD-10-CM | POA: Diagnosis not present

## 2015-07-26 DIAGNOSIS — R2689 Other abnormalities of gait and mobility: Secondary | ICD-10-CM | POA: Diagnosis not present

## 2015-07-26 DIAGNOSIS — R296 Repeated falls: Secondary | ICD-10-CM | POA: Diagnosis not present

## 2015-07-26 DIAGNOSIS — R42 Dizziness and giddiness: Secondary | ICD-10-CM | POA: Diagnosis not present

## 2015-07-27 DIAGNOSIS — R42 Dizziness and giddiness: Secondary | ICD-10-CM | POA: Diagnosis not present

## 2015-07-27 DIAGNOSIS — R296 Repeated falls: Secondary | ICD-10-CM | POA: Diagnosis not present

## 2015-07-27 DIAGNOSIS — R2689 Other abnormalities of gait and mobility: Secondary | ICD-10-CM | POA: Diagnosis not present

## 2015-07-28 DIAGNOSIS — R42 Dizziness and giddiness: Secondary | ICD-10-CM | POA: Diagnosis not present

## 2015-07-28 DIAGNOSIS — R296 Repeated falls: Secondary | ICD-10-CM | POA: Diagnosis not present

## 2015-07-28 DIAGNOSIS — R2689 Other abnormalities of gait and mobility: Secondary | ICD-10-CM | POA: Diagnosis not present

## 2015-07-29 DIAGNOSIS — R2689 Other abnormalities of gait and mobility: Secondary | ICD-10-CM | POA: Diagnosis not present

## 2015-07-29 DIAGNOSIS — R296 Repeated falls: Secondary | ICD-10-CM | POA: Diagnosis not present

## 2015-07-29 DIAGNOSIS — R42 Dizziness and giddiness: Secondary | ICD-10-CM | POA: Diagnosis not present

## 2015-07-30 DIAGNOSIS — R42 Dizziness and giddiness: Secondary | ICD-10-CM | POA: Diagnosis not present

## 2015-07-30 DIAGNOSIS — R296 Repeated falls: Secondary | ICD-10-CM | POA: Diagnosis not present

## 2015-07-30 DIAGNOSIS — R2689 Other abnormalities of gait and mobility: Secondary | ICD-10-CM | POA: Diagnosis not present

## 2015-08-02 DIAGNOSIS — R296 Repeated falls: Secondary | ICD-10-CM | POA: Diagnosis not present

## 2015-08-02 DIAGNOSIS — R42 Dizziness and giddiness: Secondary | ICD-10-CM | POA: Diagnosis not present

## 2015-08-02 DIAGNOSIS — R2689 Other abnormalities of gait and mobility: Secondary | ICD-10-CM | POA: Diagnosis not present

## 2015-08-03 DIAGNOSIS — R42 Dizziness and giddiness: Secondary | ICD-10-CM | POA: Diagnosis not present

## 2015-08-03 DIAGNOSIS — R296 Repeated falls: Secondary | ICD-10-CM | POA: Diagnosis not present

## 2015-08-03 DIAGNOSIS — R2689 Other abnormalities of gait and mobility: Secondary | ICD-10-CM | POA: Diagnosis not present

## 2015-08-04 DIAGNOSIS — R2689 Other abnormalities of gait and mobility: Secondary | ICD-10-CM | POA: Diagnosis not present

## 2015-08-04 DIAGNOSIS — R296 Repeated falls: Secondary | ICD-10-CM | POA: Diagnosis not present

## 2015-08-04 DIAGNOSIS — R42 Dizziness and giddiness: Secondary | ICD-10-CM | POA: Diagnosis not present

## 2015-08-05 ENCOUNTER — Ambulatory Visit (INDEPENDENT_AMBULATORY_CARE_PROVIDER_SITE_OTHER): Payer: Commercial Managed Care - HMO | Admitting: Nurse Practitioner

## 2015-08-05 ENCOUNTER — Encounter: Payer: Self-pay | Admitting: Nurse Practitioner

## 2015-08-05 VITALS — BP 122/63 | HR 81 | Temp 97.3°F | Ht 61.0 in | Wt 100.0 lb

## 2015-08-05 DIAGNOSIS — J4520 Mild intermittent asthma, uncomplicated: Secondary | ICD-10-CM | POA: Diagnosis not present

## 2015-08-05 DIAGNOSIS — E034 Atrophy of thyroid (acquired): Secondary | ICD-10-CM | POA: Diagnosis not present

## 2015-08-05 DIAGNOSIS — R296 Repeated falls: Secondary | ICD-10-CM | POA: Diagnosis not present

## 2015-08-05 DIAGNOSIS — R2689 Other abnormalities of gait and mobility: Secondary | ICD-10-CM | POA: Diagnosis not present

## 2015-08-05 DIAGNOSIS — M810 Age-related osteoporosis without current pathological fracture: Secondary | ICD-10-CM

## 2015-08-05 DIAGNOSIS — E038 Other specified hypothyroidism: Secondary | ICD-10-CM

## 2015-08-05 DIAGNOSIS — R42 Dizziness and giddiness: Secondary | ICD-10-CM | POA: Diagnosis not present

## 2015-08-05 MED ORDER — MONTELUKAST SODIUM 10 MG PO TABS: ORAL_TABLET | ORAL | Status: DC

## 2015-08-05 MED ORDER — LEVOTHYROXINE SODIUM 25 MCG PO TABS: ORAL_TABLET | ORAL | Status: DC

## 2015-08-05 MED ORDER — ALBUTEROL SULFATE HFA 108 (90 BASE) MCG/ACT IN AERS: 2.0000 | INHALATION_SPRAY | Freq: Four times a day (QID) | RESPIRATORY_TRACT | Status: DC | PRN

## 2015-08-05 MED ORDER — FLUTICASONE-SALMETEROL 100-50 MCG/DOSE IN AEPB
1.0000 | INHALATION_SPRAY | Freq: Two times a day (BID) | RESPIRATORY_TRACT | Status: DC
Start: 2015-08-05 — End: 2015-09-14

## 2015-08-05 MED ORDER — ALENDRONATE SODIUM 70 MG PO TABS: ORAL_TABLET | ORAL | Status: DC

## 2015-08-05 NOTE — Progress Notes (Signed)
Subjective:    Patient ID: Sarah Boyd, female    DOB: Jun 08, 1930, 82 y.o.   MRN: [id number]  HPI   Patient here today for follow up of chronic medical problems.  Outpatient Encounter Prescriptions as of 08/05/2015  Medication Sig  . albuterol (PROAIR HFA) 108 (90 BASE) MCG/ACT inhaler Inhale 2 puffs into the lungs every 6 (six) hours as needed for wheezing or shortness of breath.  Marland Kitchen alendronate (FOSAMAX) 70 MG tablet TAKE 1 TABLET BY MOUTH ONCE A WEEK WITH A FULL GLASS OF WATER ON AN EMPTY STOMACH  . alendronate (FOSAMAX) 70 MG tablet TAKE 1 TABLET BY MOUTH ONCE A WEEK WITH A FULL GLASS OF WATER ON AN EMPTY STOMACH  . Calcium Carbonate-Vitamin D (CALCIUM + D PO) Take 1 tablet by mouth daily.  . diclofenac sodium (VOLTAREN) 1 % GEL Apply 2 g topically 4 (four) times daily. (Patient not taking: Reported on 04/22/2015)  . Fluticasone-Salmeterol (ADVAIR) 100-50 MCG/DOSE AEPB Inhale 1 puff into the lungs 2 (two) times daily.  Marland Kitchen levothyroxine (SYNTHROID, LEVOTHROID) 25 MCG tablet TAKE 1 TABLET EVERY MORNING ON AN EMPTY STOMACH NEEDS TO BE SEEN  . montelukast (SINGULAIR) 10 MG tablet TAKE 1 TABLET (10 MG TOTAL) BY MOUTH AT BEDTIME.   No facility-administered encounter medications on file as of 08/05/2015.  *very unsteady gait and will not use her walker in her home.  Hypothyroidism Currently on synthroid 5mcg daily- no complaints Asthma Takes singulair and advair daily has not need albuterol in quite sometime. Osteoporosis Fosamax weekly- has some back pain that she takes ultram for.  Review of Systems  Constitutional: Negative.   HENT: Negative.   Respiratory: Negative.   Genitourinary: Negative.   Neurological: Negative.   Psychiatric/Behavioral: Negative.   All other systems reviewed and are negative.      Objective:   Physical Exam  Constitutional: She is oriented to person, place, and time. She appears well-developed and well-nourished.  HENT:  Nose: Nose normal.    Mouth/Throat: Oropharynx is clear and moist.  Eyes: EOM are normal.  Neck: Trachea normal, normal range of motion and full passive range of motion without pain. Neck supple. No JVD present. Carotid bruit is not present. No thyromegaly present.  Cardiovascular: Normal rate, regular rhythm, normal heart sounds and intact distal pulses.  Exam reveals no gallop and no friction rub.   No murmur heard. Pulmonary/Chest: Effort normal and breath sounds normal.  Abdominal: Soft. Bowel sounds are normal. She exhibits no distension and no mass. There is no tenderness.  Musculoskeletal: Normal range of motion.  Patient walking with walker- steady and slow  Lymphadenopathy:    She has no cervical adenopathy.  Neurological: She is alert and oriented to person, place, and time. She has normal reflexes. No cranial nerve deficit.  Skin: Skin is warm and dry.  Psychiatric: She has a normal mood and affect. Her behavior is normal. Judgment and thought content normal.   BP 122/63 mmHg  Pulse 81  Temp(Src) 97.3 F (36.3 C) (Oral)  Ht 5\' 1"  (1.549 m)  Wt 100 lb (45.36 kg)  BMI 18.90 kg/m2       Assessment & Plan:  1. Asthma, chronic, mild intermittent, uncomplicated - Fluticasone-Salmeterol (ADVAIR) 100-50 MCG/DOSE AEPB; Inhale 1 puff into the lungs 2 (two) times daily.  Dispense: 1 each; Refill: 5 - albuterol (PROAIR HFA) 108 (90 Base) MCG/ACT inhaler; Inhale 2 puffs into the lungs every 6 (six) hours as needed for wheezing or shortness of  breath.  Dispense: 1 each; Refill: 1 - montelukast (SINGULAIR) 10 MG tablet; TAKE 1 TABLET (10 MG TOTAL) BY MOUTH AT BEDTIME.  Dispense: 30 tablet; Refill: 5  2. Hypothyroidism due to acquired atrophy of thyroid - levothyroxine (SYNTHROID, LEVOTHROID) 25 MCG tablet; TAKE 1 TABLET EVERY MORNING ON AN EMPTY STOMACH NEEDS TO BE SEEN  Dispense: 30 tablet; Refill: 8  3. Osteoporosis Weight bearing exercises - alendronate (FOSAMAX) 70 MG tablet; TAKE 1 TABLET BY MOUTH  ONCE A WEEK WITH A FULL GLASS OF WATER ON AN EMPTY STOMACH  Dispense: 4 tablet; Refill: 5    Labs pending Health maintenance reviewed Diet and exercise encouraged Continue all meds Follow up  In 60months   [place], FNP

## 2015-08-05 NOTE — Patient Instructions (Signed)
Fall Prevention in the Home  Falls can cause injuries and can affect people from all age groups. There are many simple things that you can do to make your home safe and to help prevent falls. WHAT CAN I DO ON THE OUTSIDE OF MY HOME?  Regularly repair the edges of walkways and driveways and fix any cracks.  Remove high doorway thresholds.  Trim any shrubbery on the main path into your home.  Use bright outdoor lighting.  Clear walkways of debris and clutter, including tools and rocks.  Regularly check that handrails are securely fastened and in good repair. Both sides of any steps should have handrails.  Install guardrails along the edges of any raised decks or porches.  Have leaves, snow, and ice cleared regularly.  Use sand or salt on walkways during winter months.  In the garage, clean up any spills right away, including grease or oil spills. WHAT CAN I DO IN THE BATHROOM?  Use night lights.  Install grab bars by the toilet and in the tub and shower. Do not use towel bars as grab bars.  Use non-skid mats or decals on the floor of the tub or shower.  If you need to sit down while you are in the shower, use a plastic, non-slip stool..  Keep the floor dry. Immediately clean up any water that spills on the floor.  Remove soap buildup in the tub or shower on a regular basis.  Attach bath mats securely with double-sided non-slip rug tape.  Remove throw rugs and other tripping hazards from the floor. WHAT CAN I DO IN THE BEDROOM?  Use night lights.  Make sure that a bedside light is easy to reach.  Do not use oversized bedding that drapes onto the floor.  Have a firm chair that has side arms to use for getting dressed.  Remove throw rugs and other tripping hazards from the floor. WHAT CAN I DO IN THE KITCHEN?   Clean up any spills right away.  Avoid walking on wet floors.  Place frequently used items in easy-to-reach places.  If you need to reach for something  above you, use a sturdy step stool that has a grab bar.  Keep electrical cables out of the way.  Do not use floor polish or wax that makes floors slippery. If you have to use wax, make sure that it is non-skid floor wax.  Remove throw rugs and other tripping hazards from the floor. WHAT CAN I DO IN THE STAIRWAYS?  Do not leave any items on the stairs.  Make sure that there are handrails on both sides of the stairs. Fix handrails that are broken or loose. Make sure that handrails are as long as the stairways.  Check any carpeting to make sure that it is firmly attached to the stairs. Fix any carpet that is loose or worn.  Avoid having throw rugs at the top or bottom of stairways, or secure the rugs with carpet tape to prevent them from moving.  Make sure that you have a light switch at the top of the stairs and the bottom of the stairs. If you do not have them, have them installed. WHAT ARE SOME OTHER FALL PREVENTION TIPS?  Wear closed-toe shoes that fit well and support your feet. Wear shoes that have rubber soles or low heels.  When you use a stepladder, make sure that it is completely opened and that the sides are firmly locked. Have someone hold the ladder while you   are using it. Do not climb a closed stepladder.  Add color or contrast paint or tape to grab bars and handrails in your home. Place contrasting color strips on the first and last steps.  Use mobility aids as needed, such as canes, walkers, scooters, and crutches.  Turn on lights if it is dark. Replace any light bulbs that burn out.  Set up furniture so that there are clear paths. Keep the furniture in the same spot.  Fix any uneven floor surfaces.  Choose a carpet design that does not hide the edge of steps of a stairway.  Be aware of any and all pets.  Review your medicines with your healthcare provider. Some medicines can cause dizziness or changes in blood pressure, which increase your risk of falling. Talk  with your health care provider about other ways that you can decrease your risk of falls. This may include working with a physical therapist or trainer to improve your strength, balance, and endurance.   This information is not intended to replace advice given to you by your health care provider. Make sure you discuss any questions you have with your health care provider.   Document Released: 05/19/2002 Document Revised: 10/13/2014 Document Reviewed: 07/03/2014 Elsevier Interactive Patient Education 2016 [employer]  

## 2015-08-06 DIAGNOSIS — R2689 Other abnormalities of gait and mobility: Secondary | ICD-10-CM | POA: Diagnosis not present

## 2015-08-06 DIAGNOSIS — R296 Repeated falls: Secondary | ICD-10-CM | POA: Diagnosis not present

## 2015-08-06 DIAGNOSIS — R42 Dizziness and giddiness: Secondary | ICD-10-CM | POA: Diagnosis not present

## 2015-08-06 LAB — CMP14+EGFR
A/G RATIO: 1.6 (ref 1.1–2.5)
ALBUMIN: 4.1 g/dL (ref 3.5–4.7)
ALK PHOS: 61 IU/L (ref 39–117)
ALT: 16 IU/L (ref 0–32)
AST: 24 IU/L (ref 0–40)
BILIRUBIN TOTAL: 0.3 mg/dL (ref 0.0–1.2)
BUN / CREAT RATIO: 16 (ref 11–26)
BUN: 16 mg/dL (ref 8–27)
CO2: 24 mmol/L (ref 18–29)
Calcium: 9 mg/dL (ref 8.7–10.3)
Chloride: 102 mmol/L (ref 96–106)
Creatinine, Ser: 1 mg/dL (ref 0.57–1.00)
GFR calc Af Amer: 60 mL/min/{1.73_m2} (ref >59)
GFR calc non Af Amer: 52 mL/min/{1.73_m2} — ABNORMAL LOW (ref >59)
GLOBULIN, TOTAL: 2.6 g/dL (ref 1.5–4.5)
Glucose: 79 mg/dL (ref 65–99)
POTASSIUM: 4.3 mmol/L (ref 3.5–5.2)
SODIUM: 142 mmol/L (ref 134–144)
Total Protein: 6.7 g/dL (ref 6.0–8.5)

## 2015-08-06 LAB — LIPID PANEL
CHOLESTEROL TOTAL: 187 mg/dL (ref 100–199)
Chol/HDL Ratio: 2.1 ratio units (ref 0.0–4.4)
HDL: 90 mg/dL (ref >39)
LDL Calculated: 68 mg/dL (ref 0–99)
TRIGLYCERIDES: 146 mg/dL (ref 0–149)
VLDL Cholesterol Cal: 29 mg/dL (ref 5–40)

## 2015-08-16 DIAGNOSIS — R2689 Other abnormalities of gait and mobility: Secondary | ICD-10-CM | POA: Diagnosis not present

## 2015-08-16 DIAGNOSIS — R42 Dizziness and giddiness: Secondary | ICD-10-CM | POA: Diagnosis not present

## 2015-08-16 DIAGNOSIS — R296 Repeated falls: Secondary | ICD-10-CM | POA: Diagnosis not present

## 2015-08-17 DIAGNOSIS — R42 Dizziness and giddiness: Secondary | ICD-10-CM | POA: Diagnosis not present

## 2015-08-17 DIAGNOSIS — R296 Repeated falls: Secondary | ICD-10-CM | POA: Diagnosis not present

## 2015-08-17 DIAGNOSIS — R2689 Other abnormalities of gait and mobility: Secondary | ICD-10-CM | POA: Diagnosis not present

## 2015-08-18 DIAGNOSIS — R42 Dizziness and giddiness: Secondary | ICD-10-CM | POA: Diagnosis not present

## 2015-08-18 DIAGNOSIS — R2689 Other abnormalities of gait and mobility: Secondary | ICD-10-CM | POA: Diagnosis not present

## 2015-08-18 DIAGNOSIS — R296 Repeated falls: Secondary | ICD-10-CM | POA: Diagnosis not present

## 2015-08-19 DIAGNOSIS — R296 Repeated falls: Secondary | ICD-10-CM | POA: Diagnosis not present

## 2015-08-19 DIAGNOSIS — R2689 Other abnormalities of gait and mobility: Secondary | ICD-10-CM | POA: Diagnosis not present

## 2015-08-19 DIAGNOSIS — R42 Dizziness and giddiness: Secondary | ICD-10-CM | POA: Diagnosis not present

## 2015-08-20 DIAGNOSIS — R2689 Other abnormalities of gait and mobility: Secondary | ICD-10-CM | POA: Diagnosis not present

## 2015-08-20 DIAGNOSIS — R296 Repeated falls: Secondary | ICD-10-CM | POA: Diagnosis not present

## 2015-08-20 DIAGNOSIS — R42 Dizziness and giddiness: Secondary | ICD-10-CM | POA: Diagnosis not present

## 2015-08-23 DIAGNOSIS — R2689 Other abnormalities of gait and mobility: Secondary | ICD-10-CM | POA: Diagnosis not present

## 2015-08-23 DIAGNOSIS — R296 Repeated falls: Secondary | ICD-10-CM | POA: Diagnosis not present

## 2015-08-23 DIAGNOSIS — R42 Dizziness and giddiness: Secondary | ICD-10-CM | POA: Diagnosis not present

## 2015-08-24 DIAGNOSIS — R2689 Other abnormalities of gait and mobility: Secondary | ICD-10-CM | POA: Diagnosis not present

## 2015-08-24 DIAGNOSIS — R296 Repeated falls: Secondary | ICD-10-CM | POA: Diagnosis not present

## 2015-08-24 DIAGNOSIS — R42 Dizziness and giddiness: Secondary | ICD-10-CM | POA: Diagnosis not present

## 2015-08-25 DIAGNOSIS — R42 Dizziness and giddiness: Secondary | ICD-10-CM | POA: Diagnosis not present

## 2015-08-25 DIAGNOSIS — R296 Repeated falls: Secondary | ICD-10-CM | POA: Diagnosis not present

## 2015-08-25 DIAGNOSIS — R2689 Other abnormalities of gait and mobility: Secondary | ICD-10-CM | POA: Diagnosis not present

## 2015-08-26 DIAGNOSIS — R42 Dizziness and giddiness: Secondary | ICD-10-CM | POA: Diagnosis not present

## 2015-08-26 DIAGNOSIS — R2689 Other abnormalities of gait and mobility: Secondary | ICD-10-CM | POA: Diagnosis not present

## 2015-08-26 DIAGNOSIS — R296 Repeated falls: Secondary | ICD-10-CM | POA: Diagnosis not present

## 2015-08-27 ENCOUNTER — Emergency Department (HOSPITAL_COMMUNITY)
Admission: EM | Admit: 2015-08-27 | Discharge: 2015-08-27 | Disposition: A | Discharge status: Home / Self Care | Payer: Commercial Managed Care - HMO | Attending: Emergency Medicine | Admitting: Emergency Medicine

## 2015-08-27 ENCOUNTER — Emergency Department (HOSPITAL_COMMUNITY): Payer: Commercial Managed Care - HMO

## 2015-08-27 ENCOUNTER — Encounter (HOSPITAL_COMMUNITY): Payer: Self-pay | Admitting: *Deleted

## 2015-08-27 DIAGNOSIS — E039 Hypothyroidism, unspecified: Secondary | ICD-10-CM | POA: Insufficient documentation

## 2015-08-27 DIAGNOSIS — S0990XA Unspecified injury of head, initial encounter: Secondary | ICD-10-CM | POA: Diagnosis not present

## 2015-08-27 DIAGNOSIS — Z853 Personal history of malignant neoplasm of breast: Secondary | ICD-10-CM | POA: Insufficient documentation

## 2015-08-27 DIAGNOSIS — Y999 Unspecified external cause status: Secondary | ICD-10-CM | POA: Diagnosis not present

## 2015-08-27 DIAGNOSIS — Z79899 Other long term (current) drug therapy: Secondary | ICD-10-CM | POA: Insufficient documentation

## 2015-08-27 DIAGNOSIS — J45909 Unspecified asthma, uncomplicated: Secondary | ICD-10-CM | POA: Diagnosis not present

## 2015-08-27 DIAGNOSIS — R296 Repeated falls: Secondary | ICD-10-CM

## 2015-08-27 DIAGNOSIS — W01198A Fall on same level from slipping, tripping and stumbling with subsequent striking against other object, initial encounter: Secondary | ICD-10-CM | POA: Insufficient documentation

## 2015-08-27 DIAGNOSIS — Y9301 Activity, walking, marching and hiking: Secondary | ICD-10-CM | POA: Insufficient documentation

## 2015-08-27 DIAGNOSIS — S0083XA Contusion of other part of head, initial encounter: Secondary | ICD-10-CM | POA: Diagnosis not present

## 2015-08-27 DIAGNOSIS — Y929 Unspecified place or not applicable: Secondary | ICD-10-CM | POA: Insufficient documentation

## 2015-08-27 DIAGNOSIS — R2689 Other abnormalities of gait and mobility: Secondary | ICD-10-CM | POA: Diagnosis not present

## 2015-08-27 DIAGNOSIS — R42 Dizziness and giddiness: Secondary | ICD-10-CM | POA: Diagnosis not present

## 2015-08-27 LAB — BASIC METABOLIC PANEL
Anion gap: 6 (ref 5–15)
BUN: 14 mg/dL (ref 6–20)
CHLORIDE: 105 mmol/L (ref 101–111)
CO2: 28 mmol/L (ref 22–32)
CREATININE: 0.89 mg/dL (ref 0.44–1.00)
Calcium: 9 mg/dL (ref 8.9–10.3)
GFR calc Af Amer: >60 mL/min (ref >60)
GFR calc non Af Amer: 58 mL/min — ABNORMAL LOW (ref >60)
GLUCOSE: 94 mg/dL (ref 65–99)
POTASSIUM: 4 mmol/L (ref 3.5–5.1)
Sodium: 139 mmol/L (ref 135–145)

## 2015-08-27 LAB — CBC
HEMATOCRIT: 40.1 % (ref 36.0–46.0)
HEMOGLOBIN: 13.3 g/dL (ref 12.0–15.0)
MCH: 28.1 pg (ref 26.0–34.0)
MCHC: 33.2 g/dL (ref 30.0–36.0)
MCV: 84.6 fL (ref 78.0–100.0)
Platelets: 251 10*3/uL (ref 150–400)
RBC: 4.74 MIL/uL (ref 3.87–5.11)
RDW: 14.4 % (ref 11.5–15.5)
WBC: 9.3 10*3/uL (ref 4.0–10.5)

## 2015-08-27 MED ORDER — ACETAMINOPHEN 325 MG PO TABS
650.0000 mg | ORAL_TABLET | Freq: Once | ORAL | Status: AC
  Administered 2015-08-27: 650 mg via ORAL
  Filled 2015-08-27: qty 2

## 2015-08-27 NOTE — ED Provider Notes (Signed)
CSN: [id number]     Arrival date & time 08/27/15  1247 History   First MD Initiated Contact with Patient 08/27/15 1535     Chief Complaint  Patient presents with  . Fall     (Consider location/radiation/quality/duration/timing/severity/associated sxs/prior Treatment) HPI Comments: A 82 year old female with history of frequent falls osteoporosis, asthma presents after witnessed fall. Patient was leaning forward to pick something up and tipped forward hitting the right upper face area. No loss of consciousness. Patient felt brief dizziness for which she has had multiple times before. No new medicines no blood in the stools. Patient feels at baseline currently and uses a walker. Family member with patient says she is at baseline. Patient has been told to do things slowly and carefully. No blood thinners. No chest pain or headache.  Patient is a 82 y.o. female presenting with fall. The history is provided by the patient and a relative.  Fall Pertinent negatives include no chest pain, no abdominal pain, no headaches and no shortness of breath.    Past Medical History  Diagnosis Date  . Labral tear of shoulder RIGHT SHOULDER  . Asthma   . History of breast cancer S/P RIGHT MASTECTOMY AND CHEMO 21 YRS AGO (APPROX.  1990)    NO RECURRENCE  . Right shoulder pain   . HOH (hard of hearing) NO AIDS  . Hypothyroidism    Past Surgical History  Procedure Laterality Date  . Excision of right chest mass  12-19-1999    BENIGN  . Mastectomy  1990  (APPROX.)    RIGHT BREAST CANCER -- NO RECURRENCE  . Orif bilateral wrist  2009  . Cataract extraction, bilateral    . Shoulder arthroscopy  12/06/2011    Procedure: ARTHROSCOPY SHOULDER;  Surgeon: Magnus Sinning, MD;  Location: [facility];  Service: Orthopedics;  Laterality: Right;  WITH LABRIAL DEBRIDEMENT AND SAD INTERSCALINE BLOCK   Family History  Problem Relation Age of Onset  . Stroke Mother    Social History  Substance Use  Topics  . Smoking status: Never Smoker   . Smokeless tobacco: Never Used  . Alcohol Use: No   OB History    Gravida Para Term Preterm AB TAB SAB Ectopic Multiple Living            0     Review of Systems  Constitutional: Negative for fever and chills.  HENT: Negative for congestion.   Eyes: Negative for visual disturbance.  Respiratory: Negative for shortness of breath.   Cardiovascular: Negative for chest pain.  Gastrointestinal: Negative for vomiting and abdominal pain.  Genitourinary: Negative for dysuria and flank pain.  Musculoskeletal: Negative for back pain, neck pain and neck stiffness.  Skin: Positive for wound. Negative for rash.  Neurological: Positive for dizziness. Negative for light-headedness and headaches.      Allergies  Benadryl and Chocolate  Home Medications   Prior to Admission medications   Medication Sig Start Date End Date Taking? Authorizing Provider  albuterol (PROAIR HFA) 108 (90 Base) MCG/ACT inhaler Inhale 2 puffs into the lungs every 6 (six) hours as needed for wheezing or shortness of breath. 08/05/15   Mary-Margaret Hassell Done, FNP  alendronate (FOSAMAX) 70 MG tablet TAKE 1 TABLET BY MOUTH ONCE A WEEK WITH A FULL GLASS OF WATER ON AN EMPTY STOMACH 08/05/15   Mary-Margaret Hassell Done, FNP  Calcium Carbonate-Vitamin D (CALCIUM + D PO) Take 1 tablet by mouth daily.    Historical Provider, MD  diclofenac sodium (VOLTAREN)  1 % GEL Apply 2 g topically 4 (four) times daily. 04/15/15   Timmothy Euler, MD  Fluticasone-Salmeterol (ADVAIR) 100-50 MCG/DOSE AEPB Inhale 1 puff into the lungs 2 (two) times daily. 08/05/15   Mary-Margaret Hassell Done, FNP  levothyroxine (SYNTHROID, LEVOTHROID) 25 MCG tablet TAKE 1 TABLET EVERY MORNING ON AN EMPTY STOMACH NEEDS TO BE SEEN 08/05/15   Mary-Margaret Hassell Done, FNP  montelukast (SINGULAIR) 10 MG tablet TAKE 1 TABLET (10 MG TOTAL) BY MOUTH AT BEDTIME. 08/05/15   Mary-Margaret Hassell Done, FNP   BP 159/62 mmHg  Pulse 89  Temp(Src) 98.4 F  (36.9 C) (Oral)  Resp 16  Ht 5\' 1"  (1.549 m)  Wt 100 lb (45.36 kg)  BMI 18.90 kg/m2  SpO2 95% Physical Exam  Constitutional: She is oriented to person, place, and time. She appears well-developed and well-nourished.  HENT:  Head: Normocephalic.  Patient has contusion and ecchymosis periorbital on the right. Mild tender and swelling to palpation bilateral maxillary region. No tenderness with opening and closing the jaw. No neck tenderness full range of motion head neck. No tenderness with moving all 4 extremities at major joints.  Eyes: Conjunctivae are normal. Right eye exhibits no discharge. Left eye exhibits no discharge.  Neck: Normal range of motion. Neck supple. No tracheal deviation present.  Cardiovascular: Normal rate and regular rhythm.   Pulmonary/Chest: Effort normal and breath sounds normal.  Abdominal: Soft. She exhibits no distension. There is no tenderness. There is no guarding.  Musculoskeletal: She exhibits tenderness. She exhibits no edema.  Neurological: She is alert and oriented to person, place, and time. GCS eye subscore is 4. GCS verbal subscore is 5. GCS motor subscore is 6.  Mild general weakness which is baseline for patient. Patient uses a walker normally. Finger nose intact, X Acoma muscle function intact, no slurred speech, no facial droop, no arm drift. In isolation 5+ strength at major joints in no hip tenderness with flexion.  Skin: Skin is warm. No rash noted.  Psychiatric: She has a normal mood and affect.  Nursing note and vitals reviewed.   ED Course  Procedures (including critical care time) Labs Review Labs Reviewed  BASIC METABOLIC PANEL - Abnormal; Notable for the following:    GFR calc non Af Amer 58 (*)    All other components within normal limits  CBC    Imaging Review Ct Head Wo Contrast  08/27/2015  CLINICAL DATA:  Fall.  Head trauma.  Right orbital ecchymosis. EXAM: CT HEAD WITHOUT CONTRAST CT MAXILLOFACIAL WITHOUT CONTRAST TECHNIQUE:  Multidetector CT imaging of the head and maxillofacial structures were performed using the standard protocol without intravenous contrast. Multiplanar CT image reconstructions of the maxillofacial structures were also generated. COMPARISON:  08/02/2014 head CT. FINDINGS: CT HEAD FINDINGS No evidence of parenchymal hemorrhage or extra-axial fluid collection. No mass lesion, mass effect, or midline shift. No CT evidence of acute infarction. Intracranial atherosclerosis. Nonspecific subcortical and periventricular white matter hypodensity, most in keeping with chronic small vessel ischemic change. Mild diffuse cerebral volume loss. Stable small dilated perivascular spaces posterior to the basal ganglia. No ventriculomegaly. The visualized paranasal sinuses are essentially clear. The mastoid air cells are unopacified. No evidence of calvarial fracture. CT MAXILLOFACIAL FINDINGS Mild to moderate superficial lateral right periorbital and right premaxillary contusion. Globes appear intact. No intraconal hematoma. No maxillofacial fracture. The maxilla and mandible appear intact. The nasal bones are unremarkable in appearance. No dislocation at the temporomandibular joints. The visualized dentition demonstrates no acute abnormality. The visualized paranasal  sinuses are essentially clear. The visualized mastoid air cells are unopacified. No aggressive appearing focal osseous lesions. The parapharyngeal fat planes are preserved. The nasopharynx, oropharynx and hypopharynx are unremarkable in appearance. The parotid and submandibular glands are within normal limits. No cervical lymphadenopathy is seen. IMPRESSION: 1. No evidence of acute intracranial abnormality. No calvarial fracture. 2. Lateral right periorbital and right pre-maxillary contusions. No maxillofacial fracture. 3. Mild cerebral volume loss and chronic small vessel ischemia. Electronically Signed   By: Ilona Sorrel M.D.   On: 08/27/2015 17:27   Ct Maxillofacial  Wo Cm  08/27/2015  CLINICAL DATA:  Fall.  Head trauma.  Right orbital ecchymosis. EXAM: CT HEAD WITHOUT CONTRAST CT MAXILLOFACIAL WITHOUT CONTRAST TECHNIQUE: Multidetector CT imaging of the head and maxillofacial structures were performed using the standard protocol without intravenous contrast. Multiplanar CT image reconstructions of the maxillofacial structures were also generated. COMPARISON:  08/02/2014 head CT. FINDINGS: CT HEAD FINDINGS No evidence of parenchymal hemorrhage or extra-axial fluid collection. No mass lesion, mass effect, or midline shift. No CT evidence of acute infarction. Intracranial atherosclerosis. Nonspecific subcortical and periventricular white matter hypodensity, most in keeping with chronic small vessel ischemic change. Mild diffuse cerebral volume loss. Stable small dilated perivascular spaces posterior to the basal ganglia. No ventriculomegaly. The visualized paranasal sinuses are essentially clear. The mastoid air cells are unopacified. No evidence of calvarial fracture. CT MAXILLOFACIAL FINDINGS Mild to moderate superficial lateral right periorbital and right premaxillary contusion. Globes appear intact. No intraconal hematoma. No maxillofacial fracture. The maxilla and mandible appear intact. The nasal bones are unremarkable in appearance. No dislocation at the temporomandibular joints. The visualized dentition demonstrates no acute abnormality. The visualized paranasal sinuses are essentially clear. The visualized mastoid air cells are unopacified. No aggressive appearing focal osseous lesions. The parapharyngeal fat planes are preserved. The nasopharynx, oropharynx and hypopharynx are unremarkable in appearance. The parotid and submandibular glands are within normal limits. No cervical lymphadenopathy is seen. IMPRESSION: 1. No evidence of acute intracranial abnormality. No calvarial fracture. 2. Lateral right periorbital and right pre-maxillary contusions. No maxillofacial  fracture. 3. Mild cerebral volume loss and chronic small vessel ischemia. Electronically Signed   By: Ilona Sorrel M.D.   On: 08/27/2015 17:27   I have personally reviewed and evaluated these images and lab results as part of my medical decision-making.   EKG Interpretation None      MDM   Final diagnoses:  Acute head injury, initial encounter  Frequent falls  Facial contusion, initial encounter   Patient presents after likely mechanical fall, patient has frequent falls and intermittent dizziness. No focal neuro deficits, no chest pain or shortness of breath. Plan for screening blood work CT head and face and likely outpatient follow-up with primary doctor. Patient has a walker. CTs and blood work unremarkable. Patient stable in the ER. Results and differential diagnosis were discussed with the patient/parent/guardian. Xrays were independently reviewed by myself.  Close follow up outpatient was discussed, comfortable with the plan.   Medications  acetaminophen (TYLENOL) tablet 650 mg (650 mg Oral Given 08/27/15 1711)    Filed Vitals:   08/27/15 1315  BP: 159/62  Pulse: 89  Temp: 98.4 F (36.9 C)  TempSrc: Oral  Resp: 16  Height: 5\' 1"  (1.549 m)  Weight: 100 lb (45.36 kg)  SpO2: 95%    Final diagnoses:  Acute head injury, initial encounter  Frequent falls  Facial contusion, initial encounter       Elnora Morrison, MD 08/27/15 [id number]

## 2015-08-27 NOTE — ED Notes (Signed)
Pt reports leaning down and falling  forward, hit head on floor. Denies LOC. Right eye bruised. Pt alert, ambulatory to triage.

## 2015-08-27 NOTE — ED Notes (Signed)
Ice given

## 2015-08-27 NOTE — Discharge Instructions (Signed)
If you were given medicines take as directed.  If you are on coumadin or contraceptives realize their levels and effectiveness is altered by many different medicines.  If you have any reaction (rash, tongues swelling, other) to the medicines stop taking and see a physician.    If your blood pressure was elevated in the ER make sure you follow up for management with a primary doctor or return for chest pain, shortness of breath or stroke symptoms.  Please follow up as directed and return to the ER or see a physician for new or worsening symptoms.  Thank you. Filed Vitals:   08/27/15 1315  BP: 159/62  Pulse: 89  Temp: 98.4 F (36.9 C)  TempSrc: Oral  Resp: 16  Height: 5\' 1"  (1.549 m)  Weight: 100 lb (45.36 kg)  SpO2: 95%

## 2015-08-30 ENCOUNTER — Telehealth: Payer: Self-pay | Admitting: Nurse Practitioner

## 2015-08-30 DIAGNOSIS — R2689 Other abnormalities of gait and mobility: Secondary | ICD-10-CM | POA: Diagnosis not present

## 2015-08-30 DIAGNOSIS — R42 Dizziness and giddiness: Secondary | ICD-10-CM | POA: Diagnosis not present

## 2015-08-30 DIAGNOSIS — R296 Repeated falls: Secondary | ICD-10-CM | POA: Diagnosis not present

## 2015-08-30 NOTE — Telephone Encounter (Signed)
Pt was given appt with MMM 3/23 at 11:30.

## 2015-08-31 DIAGNOSIS — R42 Dizziness and giddiness: Secondary | ICD-10-CM | POA: Diagnosis not present

## 2015-08-31 DIAGNOSIS — R2689 Other abnormalities of gait and mobility: Secondary | ICD-10-CM | POA: Diagnosis not present

## 2015-08-31 DIAGNOSIS — R296 Repeated falls: Secondary | ICD-10-CM | POA: Diagnosis not present

## 2015-09-01 DIAGNOSIS — R296 Repeated falls: Secondary | ICD-10-CM | POA: Diagnosis not present

## 2015-09-01 DIAGNOSIS — R2689 Other abnormalities of gait and mobility: Secondary | ICD-10-CM | POA: Diagnosis not present

## 2015-09-01 DIAGNOSIS — R42 Dizziness and giddiness: Secondary | ICD-10-CM | POA: Diagnosis not present

## 2015-09-02 ENCOUNTER — Encounter: Payer: Self-pay | Admitting: Nurse Practitioner

## 2015-09-02 ENCOUNTER — Ambulatory Visit (INDEPENDENT_AMBULATORY_CARE_PROVIDER_SITE_OTHER): Payer: Commercial Managed Care - HMO | Admitting: Nurse Practitioner

## 2015-09-02 VITALS — BP 135/66 | HR 87 | Temp 96.5°F | Ht 61.0 in | Wt 101.0 lb

## 2015-09-02 DIAGNOSIS — R42 Dizziness and giddiness: Secondary | ICD-10-CM | POA: Diagnosis not present

## 2015-09-02 DIAGNOSIS — Z09 Encounter for follow-up examination after completed treatment for conditions other than malignant neoplasm: Secondary | ICD-10-CM | POA: Diagnosis not present

## 2015-09-02 DIAGNOSIS — R296 Repeated falls: Secondary | ICD-10-CM | POA: Diagnosis not present

## 2015-09-02 DIAGNOSIS — R2689 Other abnormalities of gait and mobility: Secondary | ICD-10-CM | POA: Diagnosis not present

## 2015-09-02 MED ORDER — ALBUTEROL SULFATE (2.5 MG/3ML) 0.083% IN NEBU: 2.5000 mg | INHALATION_SOLUTION | Freq: Four times a day (QID) | RESPIRATORY_TRACT | Status: DC | PRN

## 2015-09-02 NOTE — Progress Notes (Addendum)
   Subjective:    Patient ID: Sarah Boyd, female    DOB: 1930-02-26, 82 y.o.   MRN: [id number]  HPI Patient here today for hospital follow up- she fell at home and hit her head on th floor- did not knock her out- went to ER and they did ct scan which was negative. Did not stay over night in hospital. She denies headache or blurred vision- she does C/O dizziness but that is not unusual for her. Has some meclizine at home that she can take.    Review of Systems  Constitutional: Negative.   HENT: Negative.   Eyes: Negative for visual disturbance.  Respiratory: Negative.   Cardiovascular: Negative.   Genitourinary: Negative.   Neurological: Positive for dizziness.  Psychiatric/Behavioral: Negative.   All other systems reviewed and are negative.      Objective:   Physical Exam  Constitutional: She is oriented to person, place, and time. She appears well-developed and well-nourished. No distress.  Cardiovascular: Normal rate, regular rhythm and normal heart sounds.   Pulmonary/Chest: Effort normal and breath sounds normal.  Neurological: She is alert and oriented to person, place, and time. She has normal reflexes. No cranial nerve deficit.  Skin: Skin is warm.  Yellowish green ecchymosis to right cheek and around eye   Psychiatric: She has a normal mood and affect. Her behavior is normal. Judgment and thought content normal.   BP 135/66 mmHg  Pulse 87  Temp(Src) 96.5 F (35.8 C) (Oral)  Ht 5\' 1"  (1.549 m)  Wt 101 lb (45.813 kg)  BMI 19.09 kg/m2        Assessment & Plan:  1. Hospital discharge follow-up Hospital records reviewed  2. Vertigo Take meclizine as needed Force fluids Rise slowly from laying to sitting and sitting to standing RTO prn  Mary-Margaret Hassell Done, FNP

## 2015-09-02 NOTE — Patient Instructions (Signed)
Fall Prevention in the Home  Falls can cause injuries and can affect people from all age groups. There are many simple things that you can do to make your home safe and to help prevent falls. WHAT CAN I DO ON THE OUTSIDE OF MY HOME?  Regularly repair the edges of walkways and driveways and fix any cracks.  Remove high doorway thresholds.  Trim any shrubbery on the main path into your home.  Use bright outdoor lighting.  Clear walkways of debris and clutter, including tools and rocks.  Regularly check that handrails are securely fastened and in good repair. Both sides of any steps should have handrails.  Install guardrails along the edges of any raised decks or porches.  Have leaves, snow, and ice cleared regularly.  Use sand or salt on walkways during winter months.  In the garage, clean up any spills right away, including grease or oil spills. WHAT CAN I DO IN THE BATHROOM?  Use night lights.  Install grab bars by the toilet and in the tub and shower. Do not use towel bars as grab bars.  Use non-skid mats or decals on the floor of the tub or shower.  If you need to sit down while you are in the shower, use a plastic, non-slip stool..  Keep the floor dry. Immediately clean up any water that spills on the floor.  Remove soap buildup in the tub or shower on a regular basis.  Attach bath mats securely with double-sided non-slip rug tape.  Remove throw rugs and other tripping hazards from the floor. WHAT CAN I DO IN THE BEDROOM?  Use night lights.  Make sure that a bedside light is easy to reach.  Do not use oversized bedding that drapes onto the floor.  Have a firm chair that has side arms to use for getting dressed.  Remove throw rugs and other tripping hazards from the floor. WHAT CAN I DO IN THE KITCHEN?   Clean up any spills right away.  Avoid walking on wet floors.  Place frequently used items in easy-to-reach places.  If you need to reach for something  above you, use a sturdy step stool that has a grab bar.  Keep electrical cables out of the way.  Do not use floor polish or wax that makes floors slippery. If you have to use wax, make sure that it is non-skid floor wax.  Remove throw rugs and other tripping hazards from the floor. WHAT CAN I DO IN THE STAIRWAYS?  Do not leave any items on the stairs.  Make sure that there are handrails on both sides of the stairs. Fix handrails that are broken or loose. Make sure that handrails are as long as the stairways.  Check any carpeting to make sure that it is firmly attached to the stairs. Fix any carpet that is loose or worn.  Avoid having throw rugs at the top or bottom of stairways, or secure the rugs with carpet tape to prevent them from moving.  Make sure that you have a light switch at the top of the stairs and the bottom of the stairs. If you do not have them, have them installed. WHAT ARE SOME OTHER FALL PREVENTION TIPS?  Wear closed-toe shoes that fit well and support your feet. Wear shoes that have rubber soles or low heels.  When you use a stepladder, make sure that it is completely opened and that the sides are firmly locked. Have someone hold the ladder while you   are using it. Do not climb a closed stepladder.  Add color or contrast paint or tape to grab bars and handrails in your home. Place contrasting color strips on the first and last steps.  Use mobility aids as needed, such as canes, walkers, scooters, and crutches.  Turn on lights if it is dark. Replace any light bulbs that burn out.  Set up furniture so that there are clear paths. Keep the furniture in the same spot.  Fix any uneven floor surfaces.  Choose a carpet design that does not hide the edge of steps of a stairway.  Be aware of any and all pets.  Review your medicines with your healthcare provider. Some medicines can cause dizziness or changes in blood pressure, which increase your risk of falling. Talk  with your health care provider about other ways that you can decrease your risk of falls. This may include working with a physical therapist or trainer to improve your strength, balance, and endurance.   This information is not intended to replace advice given to you by your health care provider. Make sure you discuss any questions you have with your health care provider.   Document Released: 05/19/2002 Document Revised: 10/13/2014 Document Reviewed: 07/03/2014 Elsevier Interactive Patient Education 2016 [employer]  

## 2015-09-03 DIAGNOSIS — R296 Repeated falls: Secondary | ICD-10-CM | POA: Diagnosis not present

## 2015-09-03 DIAGNOSIS — R42 Dizziness and giddiness: Secondary | ICD-10-CM | POA: Diagnosis not present

## 2015-09-03 DIAGNOSIS — R2689 Other abnormalities of gait and mobility: Secondary | ICD-10-CM | POA: Diagnosis not present

## 2015-09-06 DIAGNOSIS — R42 Dizziness and giddiness: Secondary | ICD-10-CM | POA: Diagnosis not present

## 2015-09-06 DIAGNOSIS — R2689 Other abnormalities of gait and mobility: Secondary | ICD-10-CM | POA: Diagnosis not present

## 2015-09-06 DIAGNOSIS — R296 Repeated falls: Secondary | ICD-10-CM | POA: Diagnosis not present

## 2015-09-07 DIAGNOSIS — R42 Dizziness and giddiness: Secondary | ICD-10-CM | POA: Diagnosis not present

## 2015-09-07 DIAGNOSIS — R2689 Other abnormalities of gait and mobility: Secondary | ICD-10-CM | POA: Diagnosis not present

## 2015-09-07 DIAGNOSIS — R296 Repeated falls: Secondary | ICD-10-CM | POA: Diagnosis not present

## 2015-09-08 DIAGNOSIS — R42 Dizziness and giddiness: Secondary | ICD-10-CM | POA: Diagnosis not present

## 2015-09-08 DIAGNOSIS — R2689 Other abnormalities of gait and mobility: Secondary | ICD-10-CM | POA: Diagnosis not present

## 2015-09-08 DIAGNOSIS — R296 Repeated falls: Secondary | ICD-10-CM | POA: Diagnosis not present

## 2015-09-09 DIAGNOSIS — R2689 Other abnormalities of gait and mobility: Secondary | ICD-10-CM | POA: Diagnosis not present

## 2015-09-09 DIAGNOSIS — R42 Dizziness and giddiness: Secondary | ICD-10-CM | POA: Diagnosis not present

## 2015-09-09 DIAGNOSIS — R296 Repeated falls: Secondary | ICD-10-CM | POA: Diagnosis not present

## 2015-09-10 ENCOUNTER — Other Ambulatory Visit: Payer: Self-pay | Admitting: *Deleted

## 2015-09-10 DIAGNOSIS — R296 Repeated falls: Secondary | ICD-10-CM | POA: Diagnosis not present

## 2015-09-10 DIAGNOSIS — R2689 Other abnormalities of gait and mobility: Secondary | ICD-10-CM | POA: Diagnosis not present

## 2015-09-10 DIAGNOSIS — J4520 Mild intermittent asthma, uncomplicated: Secondary | ICD-10-CM

## 2015-09-10 DIAGNOSIS — R42 Dizziness and giddiness: Secondary | ICD-10-CM | POA: Diagnosis not present

## 2015-09-10 DIAGNOSIS — E034 Atrophy of thyroid (acquired): Secondary | ICD-10-CM

## 2015-09-10 DIAGNOSIS — M810 Age-related osteoporosis without current pathological fracture: Secondary | ICD-10-CM

## 2015-09-10 MED ORDER — LEVOTHYROXINE SODIUM 25 MCG PO TABS
ORAL_TABLET | ORAL | Status: DC
Start: 2015-09-10 — End: 2016-02-04

## 2015-09-10 MED ORDER — ALENDRONATE SODIUM 70 MG PO TABS: ORAL_TABLET | ORAL | Status: DC

## 2015-09-10 MED ORDER — MONTELUKAST SODIUM 10 MG PO TABS: ORAL_TABLET | ORAL | Status: DC

## 2015-09-13 DIAGNOSIS — R2689 Other abnormalities of gait and mobility: Secondary | ICD-10-CM | POA: Diagnosis not present

## 2015-09-14 ENCOUNTER — Telehealth: Payer: Self-pay | Admitting: *Deleted

## 2015-09-14 DIAGNOSIS — R2689 Other abnormalities of gait and mobility: Secondary | ICD-10-CM | POA: Diagnosis not present

## 2015-09-14 DIAGNOSIS — J4520 Mild intermittent asthma, uncomplicated: Secondary | ICD-10-CM

## 2015-09-14 MED ORDER — FLUTICASONE-SALMETEROL 100-50 MCG/DOSE IN AEPB: 1.0000 | INHALATION_SPRAY | Freq: Two times a day (BID) | RESPIRATORY_TRACT | Status: DC

## 2015-09-14 NOTE — Telephone Encounter (Signed)
done

## 2015-09-15 DIAGNOSIS — R2689 Other abnormalities of gait and mobility: Secondary | ICD-10-CM | POA: Diagnosis not present

## 2015-09-16 DIAGNOSIS — R2689 Other abnormalities of gait and mobility: Secondary | ICD-10-CM | POA: Diagnosis not present

## 2015-09-17 ENCOUNTER — Telehealth: Payer: Self-pay | Admitting: Nurse Practitioner

## 2015-09-17 DIAGNOSIS — R2689 Other abnormalities of gait and mobility: Secondary | ICD-10-CM | POA: Diagnosis not present

## 2015-09-17 NOTE — Telephone Encounter (Signed)
Pt scheduled with [facility]

## 2015-09-20 DIAGNOSIS — H903 Sensorineural hearing loss, bilateral: Secondary | ICD-10-CM | POA: Diagnosis not present

## 2015-09-20 DIAGNOSIS — R2689 Other abnormalities of gait and mobility: Secondary | ICD-10-CM | POA: Diagnosis not present

## 2015-09-21 DIAGNOSIS — R2689 Other abnormalities of gait and mobility: Secondary | ICD-10-CM | POA: Diagnosis not present

## 2015-09-21 DIAGNOSIS — M1711 Unilateral primary osteoarthritis, right knee: Secondary | ICD-10-CM | POA: Diagnosis not present

## 2015-09-21 DIAGNOSIS — M25561 Pain in right knee: Secondary | ICD-10-CM | POA: Diagnosis not present

## 2015-09-22 DIAGNOSIS — R2689 Other abnormalities of gait and mobility: Secondary | ICD-10-CM | POA: Diagnosis not present

## 2015-09-23 DIAGNOSIS — R2689 Other abnormalities of gait and mobility: Secondary | ICD-10-CM | POA: Diagnosis not present

## 2015-09-24 DIAGNOSIS — R2689 Other abnormalities of gait and mobility: Secondary | ICD-10-CM | POA: Diagnosis not present

## 2015-09-27 DIAGNOSIS — R2689 Other abnormalities of gait and mobility: Secondary | ICD-10-CM | POA: Diagnosis not present

## 2015-09-28 DIAGNOSIS — R2689 Other abnormalities of gait and mobility: Secondary | ICD-10-CM | POA: Diagnosis not present

## 2015-09-29 DIAGNOSIS — R2689 Other abnormalities of gait and mobility: Secondary | ICD-10-CM | POA: Diagnosis not present

## 2015-09-30 DIAGNOSIS — R2689 Other abnormalities of gait and mobility: Secondary | ICD-10-CM | POA: Diagnosis not present

## 2015-10-01 DIAGNOSIS — R2689 Other abnormalities of gait and mobility: Secondary | ICD-10-CM | POA: Diagnosis not present

## 2015-10-04 DIAGNOSIS — R2689 Other abnormalities of gait and mobility: Secondary | ICD-10-CM | POA: Diagnosis not present

## 2015-10-05 DIAGNOSIS — R2689 Other abnormalities of gait and mobility: Secondary | ICD-10-CM | POA: Diagnosis not present

## 2015-10-06 DIAGNOSIS — R2689 Other abnormalities of gait and mobility: Secondary | ICD-10-CM | POA: Diagnosis not present

## 2015-10-07 ENCOUNTER — Encounter: Payer: Self-pay | Admitting: Neurology

## 2015-10-07 ENCOUNTER — Ambulatory Visit (INDEPENDENT_AMBULATORY_CARE_PROVIDER_SITE_OTHER): Payer: Commercial Managed Care - HMO | Admitting: Neurology

## 2015-10-07 VITALS — BP 116/58 | HR 78 | Resp 14 | Ht 61.0 in | Wt 100.0 lb

## 2015-10-07 DIAGNOSIS — R42 Dizziness and giddiness: Secondary | ICD-10-CM | POA: Diagnosis not present

## 2015-10-07 DIAGNOSIS — R296 Repeated falls: Secondary | ICD-10-CM

## 2015-10-07 DIAGNOSIS — R269 Unspecified abnormalities of gait and mobility: Secondary | ICD-10-CM

## 2015-10-07 DIAGNOSIS — R2689 Other abnormalities of gait and mobility: Secondary | ICD-10-CM | POA: Diagnosis not present

## 2015-10-07 NOTE — Progress Notes (Signed)
Subjective:    Patient ID: Sarah Boyd is a 82 y.o. female.  HPI     Interim history:   Sarah Boyd is an 82 year old right-handed woman with an underlying medical history of hypothyroidism, breast cancer, hearing loss, arthritis, asthma, and bilateral cataract repairs, who presents for followup consultation of her gait disturbance, balance problems and recurrent falls. She is accompanied by her niece, [name], again today. I last saw her on 04/08/2015: she feels okay, fell one time in the bathroom, has not injured herself. She has had intermittent issues with feeling off balance. Sometimes she is scared to walk. She gets more insecure when it started. She uses a rolling walker. She could not use the 2 wheeled walker because of uneven grounds and gravel at her niece's house. She does not always drink enough water.  Today, 10/07/2015: She reports doing about the same, no fall since March. She lives with her niece. Her home health nurse suggested that she should try medication for dizziness. Her nurse also suggested she should change doctors, per niece.  In the interim, she had another fall. She was seen in the emergency room on 08/27/2015 after a fall. I reviewed the records. She fell at the house. She was leaning forward to pick something up and tumbled forward. She hit her right upper face area, no loss of consciousness was reported. Head CT and maxillofacial CT without contrast on 08/27/2015: IMPRESSION: 1. No evidence of acute intracranial abnormality. No calvarial fracture. 2. Lateral right periorbital and right pre-maxillary contusions. No maxillofacial fracture. 3. Mild cerebral volume loss and chronic small vessel ischemia.  In addition, I personally reviewed the images through the PACS system.  Previously:   I saw her on 11/04/2014 at which time she reported doing better as far as the pelvic pain and sore L hip goes. She moved in with [name] in February 2016 after a fall. If she  does not rush, per [name], she does okay. She saw orthopedics, [name], and was released, as she was doing better. She had a swallow study with [name] on 08/31/14, which showed mild aspiration with thin liquids. She was not always drinking enough water.  I saw her on 05/26/2014, at which time [name] reported that the patient fell about 3 times in the prior 6 months. Thankfully she had not injured herself. She was not using her walker inside the house. She had no loss of consciousness or head injuries. She had gone to the emergency room with cough and mild fever and chest x-ray was negative. She was treated with a nebulizer and prescribed prednisone.  In the interim, she presented to the emergency room on 08/02/2014 after fall. I reviewed the emergency room records. She hit her head but had no loss of consciousness. She had a superficial laceration. She had a head CT without contrast on 08/02/2014 as well as a CT cervical spine without contrast which I reviewed: No acute traumatic injury to the cervical spine. No acute intracranial abnormalities. In addition, personally reviewed the head CT through the PACS system and agree with the findings.  In the interim, she presented back to the emergency room on 09/08/2014 after another fall. I reviewed the hospital emergency room records. She complained of leg pain on the left and also pelvic pain. CT of the left hip without contrast on 09/08/2014 showed: Mildly comminuted mildly displaced left superior pubic ramus fracture. Nondisplaced left inferior pubic ramus fracture. No extension to the acetabulum.   She was  symptomatically treated for pain. Her primary care provider referred her to orthopedics in April 2016.  I saw her on 05/12/2013, at which time she was living by herself and I asked her to get a call alert button. I asked her to use her walker at all times for safety. In the interim, she was seen by our nurse practitioner, [name], on 11/24/2013 at  which time she was using a cane more consistently. She had gotten a life alert button. She had not fallen recently.  I saw her on 11/05/2012, in which time I asked her to use her walker at all times. I also advised her to look into a life alert button. I felt, she most likely had a multifactorial gait disorder, d/t a combination of intermittent vertigo, arthritis, CNS atherosclerosis, and orthostatic hypotension. In the interim, she fell on 12/01/2012 and broke a rib. She fell against the dresser. Her niece goes to see her at least once a week and helps her with groceries. The patient loves to walk and has been using her walker consistently. She has walked to [employer] and to [employer]. She has been through PT.   I first met her on 08/05/2012, at which time she gave a several year history of gait and balance problems. She fell in 2012 and fractured both wrists. Years ago she fell off the porch and hurt her right shoulder. She has a history of breast cancer on the right, status post right mastectomy and chemotherapy. She was also complaining of lightheadedness upon standing quickly. She has an underlying medical history of hypothyroidism, osteoporosis, chronic lung disease, past history of breast cancer, status post right mastectomy and chemotherapy. At the time of her first visit with me she displayed mild gait insecurity but no frank ataxia. I felt she had multifactorial gait disturbance due to arthritis, cerebrovascular atherosclerosis, orthostatic hypotension, advanced age, and possible intermittent vertigo. I suggested a brain MRI with and without contrast and recommended physical therapy for gait and balance training. She was advised to use meclizine as needed. She has not been using it and was not sure if it was helpful.   Her brain MRI from 08/15/12 showed mild changes of age-appropriate generalized cerebral atrophy and chronic microvascular ischemia.   She lives in a 2 storey house by herself. If she gets up  too quickly, she feels off balance and lightheaded. She was in PT in [place], [place], but I do not have a report available.   She recently had a check up with her PCP.     Her Past Medical History Is Significant For: Past Medical History  Diagnosis Date  . Labral tear of shoulder RIGHT SHOULDER  . Asthma   . History of breast cancer S/P RIGHT MASTECTOMY AND CHEMO 21 YRS AGO (APPROX.  1990)    NO RECURRENCE  . Right shoulder pain   . HOH (hard of hearing) NO AIDS  . Hypothyroidism     Her Past Surgical History Is Significant For: Past Surgical History  Procedure Laterality Date  . Excision of right chest mass  12-19-1999    BENIGN  . Mastectomy  1990  (APPROX.)    RIGHT BREAST CANCER -- NO RECURRENCE  . Orif bilateral wrist  2009  . Cataract extraction, bilateral    . Shoulder arthroscopy  12/06/2011    Procedure: ARTHROSCOPY SHOULDER;  Surgeon: Magnus Sinning, MD;  Location: [facility];  Service: Orthopedics;  Laterality: Right;  WITH LABRIAL DEBRIDEMENT AND SAD  INTERSCALINE BLOCK    Her Family History Is Significant For: Family History  Problem Relation Age of Onset  . Stroke Mother     Her Social History Is Significant For: Social History   Social History  . Marital Status: Single    Spouse Name: N/A  . Number of Children: 0  . Years of Education: College   Occupational History  . Retired    Social History Main Topics  . Smoking status: Never Smoker   . Smokeless tobacco: Never Used  . Alcohol Use: No  . Drug Use: No  . Sexual Activity: Not Asked   Other Topics Concern  . None   Social History Narrative   Patient lives at home alone.   Caffeine Use; none    Her Allergies Are:  Allergies  Allergen Reactions  . Benadryl [Diphenhydramine Hcl] Other (See Comments)    ALTERED MENTAL STATIS  . Chocolate Other (See Comments)    Religous belief  :   Her Current Medications Are:  Outpatient Encounter Prescriptions as of 10/07/2015   Medication Sig  . albuterol (PROVENTIL) (2.5 MG/3ML) 0.083% nebulizer solution Take 3 mLs (2.5 mg total) by nebulization every 6 (six) hours as needed for wheezing or shortness of breath.  Marland Kitchen alendronate (FOSAMAX) 70 MG tablet TAKE 1 TABLET BY MOUTH ONCE A WEEK  . Calcium Carbonate-Vitamin D (CALCIUM + D PO) Take 1 tablet by mouth daily.  . Fluticasone-Salmeterol (ADVAIR) 100-50 MCG/DOSE AEPB Inhale 1 puff into the lungs 2 (two) times daily.  Marland Kitchen levothyroxine (SYNTHROID, LEVOTHROID) 25 MCG tablet TAKE 1 TABLET EVERY MORNING ON AN EMPTY STOMACH  . montelukast (SINGULAIR) 10 MG tablet TAKE 1 TABLET (10 MG TOTAL) BY MOUTH AT BEDTIME.  . [DISCONTINUED] diclofenac sodium (VOLTAREN) 1 % GEL Apply 2 g topically 4 (four) times daily.   No facility-administered encounter medications on file as of 10/07/2015.  : Review of Systems:  Out of a complete 14 point review of systems, all are reviewed and negative with the exception of these symptoms as listed below:   Review of Systems  Neurological:       Patient states that she wants to go on medicine for dizziness.  Patient reports that she has fallen several times recently.     Objective:  Neurologic Exam  Physical Exam Physical Examination:   Filed Vitals:   10/07/15 1257  BP: 116/58  Pulse: 78  Resp: 14    General Examination: The patient is a very pleasant 82 y.o. female in no acute distress. She appears frail, but weight is stable. She Reports mild lightheadedness upon standing but has no significant change in her orthostatic vitals. She does not report any vertigo upon change in position.  HEENT: Normocephalic, atraumatic, pupils are equal, round and reactive to light and accommodation. Hearing is mildly impaired bilaterally. She has bilateral cataract repairs. She has a hearing aid on the right. Neck is supple with full range of motion. She has no symptoms of vertigo upon sudden changes in head position. Extraocular tracking is good  without nystagmus noted. Speech is clear. Oropharynx examination reveals mild to moderate mouth dryness otherwise no significant findings. Tongue protrudes centrally and palate elevates symmetrically. Neck auscultation reveals no carotid bruits.  Chest is clear to auscultation without wheezing or rhonchi noted.  Heart sounds are normal without murmurs, rubs or gallops noted. Abdomen is soft, nontender with normal bowel sounds noted. She has no pitting edema in the distal lower extremities. Skin is warm and dry.  I do not appreciate any trophic changes and no joint deformities are noted and no joint swelling. She has mild arthritic changes in her hands.  Neurologically: Mental status: The patient is awake, alert and oriented in all 4 spheres. Her memory, attention, language and knowledge are age appropriate. Cranial nerves are as described above. Motor exam reveals normal bulk and strength for age. She perhaps has a slight degree of hip flexor weakness bilaterally, unchanged. Reflexes are 1+ throughout including her ankles. Cerebellar testing shows no dysmetria or intention tremor on finger to nose testing and no truncal ataxia. Sensory exam is intact to light touch. She stands slowly. She walks slowly and cautiously. She turns slowly and uses a rolling walker.             Assessment and Plan:   In summary, Ms. Brunson is an 82 year old lady with an underlying medical history of hypothyroidism, breast cancer, hearing loss, arthritis, asthma, and bilateral cataract repairs, who presents for followup consultation of her gait disturbance, balance problems and recurrent falls. She fell most recently in March and I reviewed the records and scans. In the past, she fell and hit her head and needed staples and she also fell and broke her pelvis. She is using her rolling walker consistently but has had intermittent balance problems. She does not always hydrate well. Her physical exam is stable. I prescribed a 2 wheeled  walker but this is difficult to use at her niece's house because there is a lot of gravel and and uneven grounds. He has moved in with her niece. Her memory and her other physical and neurological exam appears stable otherwise. She had an MRI in 2014 with age appropriate findings and most recently, she had a CTH and face, which I reviewed. She most likely has a multi-factorial gait disturbance d/t a combination of intermittent vertigo, arthritis, CNS atherosclerosis, orthostatic hypotension, dehydration, advancing age, prior falls and overall frailty. She had had a low normal blood pressure and her standing blood pressure was 118/58 with a pulse of 82, no change. Her weight has been stable in the low 100 lb range. She tried meclizine but it made her too sleepy. [name] is wondering about a medication for dizziness, but I explained to them, that there is no specific medication for this unfortunately. Her niece would like to seek a second opinion. She would like to stay in town if possible. I made a referral to neurology at [facility]. I will see her back routinely in 6 months, sooner if needed.  she is advised to stay well-hydrated, change positions slowly, use her walker at all times, avoid bending or squatting and stay well-hydrated. She has done physical therapy. I answered all her questions today and the patient and her niece were in agreement. I spent 25 minutes in total face-to-face time with the patient, more than 50% of which was spent in counseling and coordination of care, reviewing test results, reviewing medication and discussing or reviewing the diagnosis of gait d/o, its prognosis and treatment options.

## 2015-10-07 NOTE — Patient Instructions (Signed)
Please use your walker at all times, change positions slowly, stay better hydrated with water about 6 glasses per day (8 oz glasses each).  I would shy away from trying meclizine as it can be sedating and you have tried it before.  I will request a second opinion at [facility] neurology as per your request.

## 2015-10-08 DIAGNOSIS — R2689 Other abnormalities of gait and mobility: Secondary | ICD-10-CM | POA: Diagnosis not present

## 2015-10-11 DIAGNOSIS — R2689 Other abnormalities of gait and mobility: Secondary | ICD-10-CM | POA: Diagnosis not present

## 2015-10-12 DIAGNOSIS — R2689 Other abnormalities of gait and mobility: Secondary | ICD-10-CM | POA: Diagnosis not present

## 2015-10-13 DIAGNOSIS — R2689 Other abnormalities of gait and mobility: Secondary | ICD-10-CM | POA: Diagnosis not present

## 2015-10-14 DIAGNOSIS — R2689 Other abnormalities of gait and mobility: Secondary | ICD-10-CM | POA: Diagnosis not present

## 2015-10-15 DIAGNOSIS — R2689 Other abnormalities of gait and mobility: Secondary | ICD-10-CM | POA: Diagnosis not present

## 2015-11-01 DIAGNOSIS — J4540 Moderate persistent asthma, uncomplicated: Secondary | ICD-10-CM | POA: Diagnosis not present

## 2015-11-02 DIAGNOSIS — M1711 Unilateral primary osteoarthritis, right knee: Secondary | ICD-10-CM | POA: Diagnosis not present

## 2015-11-03 DIAGNOSIS — J60 Coalworker's pneumoconiosis: Secondary | ICD-10-CM | POA: Diagnosis not present

## 2015-11-24 DIAGNOSIS — L821 Other seborrheic keratosis: Secondary | ICD-10-CM | POA: Diagnosis not present

## 2015-11-24 DIAGNOSIS — D485 Neoplasm of uncertain behavior of skin: Secondary | ICD-10-CM | POA: Diagnosis not present

## 2015-11-24 DIAGNOSIS — L309 Dermatitis, unspecified: Secondary | ICD-10-CM | POA: Diagnosis not present

## 2015-12-04 DIAGNOSIS — J60 Coalworker's pneumoconiosis: Secondary | ICD-10-CM | POA: Diagnosis not present

## 2015-12-06 ENCOUNTER — Ambulatory Visit (INDEPENDENT_AMBULATORY_CARE_PROVIDER_SITE_OTHER): Payer: Commercial Managed Care - HMO | Admitting: Neurology

## 2015-12-06 ENCOUNTER — Encounter: Payer: Self-pay | Admitting: Neurology

## 2015-12-06 VITALS — BP 126/58 | HR 42 | Ht 61.0 in | Wt 107.0 lb

## 2015-12-06 DIAGNOSIS — R270 Ataxia, unspecified: Secondary | ICD-10-CM | POA: Diagnosis not present

## 2015-12-06 NOTE — Patient Instructions (Signed)
I think there possibly may be arthritis in the neck that is pinching the spinal cord a little bit.  But the appropriate management would be to use the walker AT ALL TIMES.  The dizziness does sound like benign paroxysmal positional vertigo.  I think it would be okay to use meclizine if it is very severe, but only once in a while.  Dr. Rexene Alberts will get my note.

## 2015-12-06 NOTE — Progress Notes (Signed)
NEUROLOGY CONSULTATION NOTE  Sarah Boyd MRN: [id number] DOB: 29-Sep-1929  Referring provider: Star Age, MD Primary care provider: Chevis Pretty, FNP  Reason for consult:  Second opinion regarding gait instability  HISTORY OF PRESENT ILLNESS: Sarah Boyd is an 82 year old right-handed woman with hypothyroidism, osteoporosis, chronic lung disease, bilateral cataract repair, hearing loss and history of breast cancer status post right mastectomy and chemotherapy who presents for second opinion regarding gait instability.  History supplemented by her niece as well as neurologist's notes.  For several years, she has had problems with balance and ambulation.  If she gets up too fast, she reports spinning sensation.  She usually has to wait a couple of minutes before she actually stands up.  She has taken meclizine in the past, which helps but has made her sleepy.    However, she is always unsteady on her feet.  She has a rolling walker.  She ambulates fine with the walker but doesn't use it in the house.  At home, when she walks to a specific room, she will rush to hold onto different furniture.  This is when she typically falls.  If she bends forward to pick something up, she will tip over and fall as well.  She falls fairly frequently, when not using her walker.  She has arthritis in the neck, low back and left hip.  However, she currently denies neck, back or leg pain.  She denies difficulty feeling the ground when standing.  She denies that her legs give out from under her.  She had an MRI of the brain with and without contrast on 08/15/12, which was personally reviewed, which revealed mild age-related cerebral atrophy and chronic small vessel ischemic changes.  Repeat CT of head following a fall on 08/27/15 was personally revealed and again showed just mild changes. Xray of left hip on 07/10/13 showed osteopenia. Myelogram of the lumbar spine from 11/21/13 revealed degenerative  spondylosis with advanced facet arthropathy at L5-S1 with foraminal stenosis bilaterally, possibly compressing both L5 nerve roots. CT cervical spine performed 08/02/14 following a fall was personally reviewed and revealed multilevel degenerative disc disease at C4-5, C5-6, C6-7 and C7-T1 Most recent thyroid panel from 02/01/15 was normal.  She lives with her niece.  PAST MEDICAL HISTORY: Past Medical History  Diagnosis Date  . Labral tear of shoulder RIGHT SHOULDER  . Asthma   . History of breast cancer S/P RIGHT MASTECTOMY AND CHEMO 21 YRS AGO (APPROX.  1990)    NO RECURRENCE  . Right shoulder pain   . HOH (hard of hearing) NO AIDS  . Hypothyroidism     PAST SURGICAL HISTORY: Past Surgical History  Procedure Laterality Date  . Excision of right chest mass  12-19-1999    BENIGN  . Mastectomy  1990  (APPROX.)    RIGHT BREAST CANCER -- NO RECURRENCE  . Orif bilateral wrist  2009  . Cataract extraction, bilateral    . Shoulder arthroscopy  12/06/2011    Procedure: ARTHROSCOPY SHOULDER;  Surgeon: Magnus Sinning, MD;  [facility];  Service: Orthopedics;  Laterality: Right;  WITH LABRIAL DEBRIDEMENT AND SAD INTERSCALINE BLOCK    MEDICATIONS: Current Outpatient Prescriptions on File Prior to Visit  Medication Sig Dispense Refill  . albuterol (PROVENTIL) (2.5 MG/3ML) 0.083% nebulizer solution Take 3 mLs (2.5 mg total) by nebulization every 6 (six) hours as needed for wheezing or shortness of breath. 150 mL 1  . alendronate (FOSAMAX) 70 MG tablet  TAKE 1 TABLET BY MOUTH ONCE A WEEK 12 tablet 1  . Calcium Carbonate-Vitamin D (CALCIUM + D PO) Take 1 tablet by mouth daily.    . Fluticasone-Salmeterol (ADVAIR) 100-50 MCG/DOSE AEPB Inhale 1 puff into the lungs 2 (two) times daily. 3 each 1  . levothyroxine (SYNTHROID, LEVOTHROID) 25 MCG tablet TAKE 1 TABLET EVERY MORNING ON AN EMPTY STOMACH 90 tablet 1  . montelukast (SINGULAIR) 10 MG tablet TAKE 1 TABLET (10 MG  TOTAL) BY MOUTH AT BEDTIME. 90 tablet 1   No current facility-administered medications on file prior to visit.    ALLERGIES: Allergies  Allergen Reactions  . Benadryl [Diphenhydramine Hcl] Other (See Comments)    ALTERED MENTAL STATIS  . Chocolate Other (See Comments)    Religous belief    FAMILY HISTORY: Family History  Problem Relation Age of Onset  . Stroke Mother     SOCIAL HISTORY: Social History   Social History  . Marital Status: Single    Spouse Name: N/A  . Number of Children: 0  . Years of Education: College   Occupational History  . Retired    Social History Main Topics  . Smoking status: Never Smoker   . Smokeless tobacco: Never Used  . Alcohol Use: No  . Drug Use: No  . Sexual Activity: Not on file   Other Topics Concern  . Not on file   Social History Narrative   Patient lives at home alone.   Caffeine Use; none    REVIEW OF SYSTEMS: Constitutional: No fevers, chills, or sweats, no generalized fatigue, change in appetite Eyes: No visual changes, double vision, eye pain Ear, nose and throat: No hearing loss, ear pain, nasal congestion, sore throat Cardiovascular: No chest pain, palpitations Respiratory:  No shortness of breath at rest or with exertion, wheezes GastrointestinaI: No nausea, vomiting, diarrhea, abdominal pain, fecal incontinence Genitourinary:  No dysuria, urinary retention or frequency Musculoskeletal:  No neck pain, back pain Integumentary: No rash, pruritus, skin lesions Neurological: as above Psychiatric: No depression, insomnia, anxiety Endocrine: No palpitations, fatigue, diaphoresis, mood swings, change in appetite, change in weight, increased thirst Hematologic/Lymphatic:  No purpura, petechiae. Allergic/Immunologic: no itchy/runny eyes, nasal congestion, recent allergic reactions, rashes  PHYSICAL EXAM: Filed Vitals:   12/06/15 1020  BP: 126/58  Pulse: 42   General: No acute distress.  Patient appears  well-groomed.  Head:  Normocephalic/atraumatic Eyes:  fundi examined but not visualized Neck: supple, no paraspinal tenderness, full range of motion Back: No paraspinal tenderness Heart: regular rate and rhythm Lungs: Clear to auscultation bilaterally. Vascular: No carotid bruits. Neurological Exam: Mental status: alert and oriented to person, place, and time, recent and remote memory intact, fund of knowledge intact, attention and concentration intact, speech fluent and not dysarthric, language intact. Cranial nerves: CN I: not tested CN II: pupils equal, round and reactive to light, visual fields intact CN III, IV, VI:  full range of motion, no nystagmus, no ptosis CN V: facial sensation intact CN VII: upper and lower face symmetric CN VIII: decreased hearing CN IX, X: gag intact, uvula midline CN XI: sternocleidomastoid and trapezius muscles intact CN XII: tongue midline Bulk & Tone: normal, no fasciculations. Motor:  5/5 throughout  Sensation:  Pinprick and vibration sensation intact. Deep Tendon Reflexes:  2+ throughout, perhaps slightly brisk in patellars, toes downgoing.  Hoffman sign absent. Finger to nose testing:  Without dysmetria.  Heel to shin:  Without dysmetria.  Gait:  Slowly uses both arms to stand up  from chair.  She ambulates well with walker.  However, she is significantly unsteady without assistance.  She can barely take a step.  She does not demonstrate shuffling.  Romberg positive.  IMPRESSION: She does appear to have benign paroxysmal positional vertigo.  She also describes occasional lightheadedness and her blood pressure typically runs low.  Pulse today is bradycardic.  However, she is always unsteady, even in absence of dizziness.  She demonstrates full range of ocular motion and without other symptoms to suggest parkinson's or parkinson-plus syndrome such as PSP or MSA.  She does not appear to have peripheral neuropathy in the feet.  Strength in legs are  intact.  Cervical stenosis is possible.  She perhaps has mildly brisk patellar reflexes, but overall no obvious upper motor signs to suggest myelopathy.  However, these symptoms may be absent in a patient her age.  MRI of cervical spine may be considered.  However, it may not change management.  If she does have a cervical stenosis, the risks of surgery may outweigh benefits, especially since symptoms have been ongoing for years and she lacks symptoms except for ataxia.  Ultimately, management is fall prevention.  We discussed importance of using the walker at all times, even in the house.    Regarding BPPV, I would limit use of meclizine.  She may use it once in a while if she has a particularly severe attack.  Thank you for allowing me to take part in the care of this patient.  Metta Clines, DO  CC:  Star Age, MD

## 2015-12-20 DIAGNOSIS — C50911 Malignant neoplasm of unspecified site of right female breast: Secondary | ICD-10-CM | POA: Diagnosis not present

## 2016-01-03 DIAGNOSIS — J60 Coalworker's pneumoconiosis: Secondary | ICD-10-CM | POA: Diagnosis not present

## 2016-01-11 DIAGNOSIS — C50911 Malignant neoplasm of unspecified site of right female breast: Secondary | ICD-10-CM | POA: Diagnosis not present

## 2016-02-03 DIAGNOSIS — J60 Coalworker's pneumoconiosis: Secondary | ICD-10-CM | POA: Diagnosis not present

## 2016-02-04 ENCOUNTER — Encounter: Payer: Self-pay | Admitting: Nurse Practitioner

## 2016-02-04 ENCOUNTER — Ambulatory Visit (INDEPENDENT_AMBULATORY_CARE_PROVIDER_SITE_OTHER): Payer: Commercial Managed Care - HMO | Admitting: Nurse Practitioner

## 2016-02-04 VITALS — BP 116/64 | HR 79 | Temp 97.0°F | Ht 61.0 in | Wt 99.0 lb

## 2016-02-04 DIAGNOSIS — E034 Atrophy of thyroid (acquired): Secondary | ICD-10-CM

## 2016-02-04 DIAGNOSIS — M810 Age-related osteoporosis without current pathological fracture: Secondary | ICD-10-CM | POA: Diagnosis not present

## 2016-02-04 DIAGNOSIS — E038 Other specified hypothyroidism: Secondary | ICD-10-CM | POA: Diagnosis not present

## 2016-02-04 DIAGNOSIS — J4520 Mild intermittent asthma, uncomplicated: Secondary | ICD-10-CM | POA: Diagnosis not present

## 2016-02-04 MED ORDER — ALENDRONATE SODIUM 70 MG PO TABS: ORAL_TABLET | ORAL | 1 refills | Status: DC

## 2016-02-04 MED ORDER — LEVOTHYROXINE SODIUM 25 MCG PO TABS: ORAL_TABLET | ORAL | 1 refills | Status: DC

## 2016-02-04 MED ORDER — MONTELUKAST SODIUM 10 MG PO TABS: ORAL_TABLET | ORAL | 1 refills | Status: DC

## 2016-02-04 MED ORDER — FLUTICASONE-SALMETEROL 100-50 MCG/DOSE IN AEPB: 1.0000 | INHALATION_SPRAY | Freq: Two times a day (BID) | RESPIRATORY_TRACT | 1 refills | Status: DC

## 2016-02-04 NOTE — Progress Notes (Signed)
Subjective:    Patient ID: Sarah Boyd, female    DOB: 28-Jul-1929, 82 y.o.   MRN: [id number]  HPI  Patient here today for follow up of chronic medical problems.  Outpatient Encounter Prescriptions as of 02/04/2016  Medication Sig  . albuterol (PROVENTIL) (2.5 MG/3ML) 0.083% nebulizer solution Take 3 mLs (2.5 mg total) by nebulization every 6 (six) hours as needed for wheezing or shortness of breath.  Marland Kitchen alendronate (FOSAMAX) 70 MG tablet TAKE 1 TABLET BY MOUTH ONCE A WEEK  . Calcium Carbonate-Vitamin D (CALCIUM + D PO) Take 1 tablet by mouth daily.  . Fluticasone-Salmeterol (ADVAIR) 100-50 MCG/DOSE AEPB Inhale 1 puff into the lungs 2 (two) times daily.  Marland Kitchen levothyroxine (SYNTHROID, LEVOTHROID) 25 MCG tablet TAKE 1 TABLET EVERY MORNING ON AN EMPTY STOMACH  . montelukast (SINGULAIR) 10 MG tablet TAKE 1 TABLET (10 MG TOTAL) BY MOUTH AT BEDTIME.   No facility-administered encounter medications on file as of 02/04/2016.     Hypothyroidism Currently on synthroid 65mg daily- no complaints Asthma Takes singulair and advair daily has not need albuterol in quite sometime. Osteoporosis Fosamax weekly- has some back pain that she takes ultram for.  Review of Systems  Constitutional: Negative.   HENT: Negative.   Respiratory: Negative.   Genitourinary: Negative.   Neurological: Negative.   Psychiatric/Behavioral: Negative.   All other systems reviewed and are negative.      Objective:   Physical Exam  Constitutional: She is oriented to person, place, and time. She appears well-developed and well-nourished.  HENT:  Nose: Nose normal.  Mouth/Throat: Oropharynx is clear and moist.  Eyes: EOM are normal.  Neck: Trachea normal, normal range of motion and full passive range of motion without pain. Neck supple. No JVD present. Carotid bruit is not present. No thyromegaly present.  Cardiovascular: Normal rate, regular rhythm, normal heart sounds and intact distal pulses.  Exam reveals no  gallop and no friction rub.   No murmur heard. Pulmonary/Chest: Effort normal and breath sounds normal.  Abdominal: Soft. Bowel sounds are normal. She exhibits no distension and no mass. There is no tenderness.  Musculoskeletal: Normal range of motion.  Patient walking with walker- steady and slow  Lymphadenopathy:    She has no cervical adenopathy.  Neurological: She is alert and oriented to person, place, and time. She has normal reflexes. No cranial nerve deficit.  Skin: Skin is warm and dry.  Psychiatric: She has a normal mood and affect. Her behavior is normal. Judgment and thought content normal.    BP 116/64   Pulse 79   Temp 97 F (36.1 C) (Oral)   Ht 5' 1" (1.549 m)   Wt 99 lb (44.9 kg)   BMI 18.71 kg/m       Assessment & Plan:  1. Asthma, chronic, mild intermittent, uncomplicated Avoid allergens - Fluticasone-Salmeterol (ADVAIR) 100-50 MCG/DOSE AEPB; Inhale 1 puff into the lungs 2 (two) times daily.  Dispense: 3 each; Refill: 1 - montelukast (SINGULAIR) 10 MG tablet; TAKE 1 TABLET (10 MG TOTAL) BY MOUTH AT BEDTIME.  Dispense: 90 tablet; Refill: 1  2. Osteoporosis Weight bearing exericses as can tolerate - alendronate (FOSAMAX) 70 MG tablet; TAKE 1 TABLET BY MOUTH ONCE A WEEK  Dispense: 12 tablet; Refill: 1  3. Hypothyroidism due to acquired atrophy of thyroid - levothyroxine (SYNTHROID, LEVOTHROID) 25 MCG tablet; TAKE 1 TABLET EVERY MORNING ON AN EMPTY STOMACH  Dispense: 90 tablet; Refill: 1 - CMP14+EGFR - Lipid panel - Thyroid Panel With  TSH    Labs pending Health maintenance reviewed Diet and exercise encouraged Continue all meds Follow up  In 3 months   Mary-Margaret Martin, FNP    

## 2016-02-04 NOTE — Patient Instructions (Signed)
Fall Prevention in the Home  Falls can cause injuries and can affect people from all age groups. There are many simple things that you can do to make your home safe and to help prevent falls. WHAT CAN I DO ON THE OUTSIDE OF MY HOME?  Regularly repair the edges of walkways and driveways and fix any cracks.  Remove high doorway thresholds.  Trim any shrubbery on the main path into your home.  Use bright outdoor lighting.  Clear walkways of debris and clutter, including tools and rocks.  Regularly check that handrails are securely fastened and in good repair. Both sides of any steps should have handrails.  Install guardrails along the edges of any raised decks or porches.  Have leaves, snow, and ice cleared regularly.  Use sand or salt on walkways during winter months.  In the garage, clean up any spills right away, including grease or oil spills. WHAT CAN I DO IN THE BATHROOM?  Use night lights.  Install grab bars by the toilet and in the tub and shower. Do not use towel bars as grab bars.  Use non-skid mats or decals on the floor of the tub or shower.  If you need to sit down while you are in the shower, use a plastic, non-slip stool..  Keep the floor dry. Immediately clean up any water that spills on the floor.  Remove soap buildup in the tub or shower on a regular basis.  Attach bath mats securely with double-sided non-slip rug tape.  Remove throw rugs and other tripping hazards from the floor. WHAT CAN I DO IN THE BEDROOM?  Use night lights.  Make sure that a bedside light is easy to reach.  Do not use oversized bedding that drapes onto the floor.  Have a firm chair that has side arms to use for getting dressed.  Remove throw rugs and other tripping hazards from the floor. WHAT CAN I DO IN THE KITCHEN?   Clean up any spills right away.  Avoid walking on wet floors.  Place frequently used items in easy-to-reach places.  If you need to reach for something  above you, use a sturdy step stool that has a grab bar.  Keep electrical cables out of the way.  Do not use floor polish or wax that makes floors slippery. If you have to use wax, make sure that it is non-skid floor wax.  Remove throw rugs and other tripping hazards from the floor. WHAT CAN I DO IN THE STAIRWAYS?  Do not leave any items on the stairs.  Make sure that there are handrails on both sides of the stairs. Fix handrails that are broken or loose. Make sure that handrails are as long as the stairways.  Check any carpeting to make sure that it is firmly attached to the stairs. Fix any carpet that is loose or worn.  Avoid having throw rugs at the top or bottom of stairways, or secure the rugs with carpet tape to prevent them from moving.  Make sure that you have a light switch at the top of the stairs and the bottom of the stairs. If you do not have them, have them installed. WHAT ARE SOME OTHER FALL PREVENTION TIPS?  Wear closed-toe shoes that fit well and support your feet. Wear shoes that have rubber soles or low heels.  When you use a stepladder, make sure that it is completely opened and that the sides are firmly locked. Have someone hold the ladder while you   are using it. Do not climb a closed stepladder.  Add color or contrast paint or tape to grab bars and handrails in your home. Place contrasting color strips on the first and last steps.  Use mobility aids as needed, such as canes, walkers, scooters, and crutches.  Turn on lights if it is dark. Replace any light bulbs that burn out.  Set up furniture so that there are clear paths. Keep the furniture in the same spot.  Fix any uneven floor surfaces.  Choose a carpet design that does not hide the edge of steps of a stairway.  Be aware of any and all pets.  Review your medicines with your healthcare provider. Some medicines can cause dizziness or changes in blood pressure, which increase your risk of falling. Talk  with your health care provider about other ways that you can decrease your risk of falls. This may include working with a physical therapist or trainer to improve your strength, balance, and endurance.   This information is not intended to replace advice given to you by your health care provider. Make sure you discuss any questions you have with your health care provider.   Document Released: 05/19/2002 Document Revised: 10/13/2014 Document Reviewed: 07/03/2014 Elsevier Interactive Patient Education 2016 [employer]  

## 2016-02-05 LAB — LIPID PANEL
CHOLESTEROL TOTAL: 185 mg/dL (ref 100–199)
Chol/HDL Ratio: 2.1 ratio units (ref 0.0–4.4)
HDL: 88 mg/dL (ref >39)
LDL Calculated: 59 mg/dL (ref 0–99)
Triglycerides: 190 mg/dL — ABNORMAL HIGH (ref 0–149)
VLDL CHOLESTEROL CAL: 38 mg/dL (ref 5–40)

## 2016-02-05 LAB — CMP14+EGFR
A/G RATIO: 1.6 (ref 1.2–2.2)
ALBUMIN: 4.1 g/dL (ref 3.5–4.7)
ALK PHOS: 61 IU/L (ref 39–117)
ALT: 11 IU/L (ref 0–32)
AST: 20 IU/L (ref 0–40)
BILIRUBIN TOTAL: 0.3 mg/dL (ref 0.0–1.2)
BUN / CREAT RATIO: 15 (ref 12–28)
BUN: 13 mg/dL (ref 8–27)
CHLORIDE: 102 mmol/L (ref 96–106)
CO2: 24 mmol/L (ref 18–29)
CREATININE: 0.89 mg/dL (ref 0.57–1.00)
Calcium: 8.9 mg/dL (ref 8.7–10.3)
GFR calc Af Amer: 69 mL/min/{1.73_m2} (ref >59)
GFR calc non Af Amer: 60 mL/min/{1.73_m2} (ref >59)
GLOBULIN, TOTAL: 2.6 g/dL (ref 1.5–4.5)
Glucose: 88 mg/dL (ref 65–99)
POTASSIUM: 4.3 mmol/L (ref 3.5–5.2)
Sodium: 144 mmol/L (ref 134–144)
Total Protein: 6.7 g/dL (ref 6.0–8.5)

## 2016-02-05 LAB — THYROID PANEL WITH TSH
FREE THYROXINE INDEX: 1.6 (ref 1.2–4.9)
T3 UPTAKE RATIO: 25 % (ref 24–39)
T4 TOTAL: 6.5 ug/dL (ref 4.5–12.0)
TSH: 1.9 u[IU]/mL (ref 0.450–4.500)

## 2016-02-08 NOTE — Progress Notes (Signed)
Patient aware.

## 2016-03-05 ENCOUNTER — Encounter (HOSPITAL_COMMUNITY): Payer: Self-pay | Admitting: Emergency Medicine

## 2016-03-05 ENCOUNTER — Emergency Department (HOSPITAL_COMMUNITY): Payer: Commercial Managed Care - HMO

## 2016-03-05 ENCOUNTER — Emergency Department (HOSPITAL_COMMUNITY)
Admission: EM | Admit: 2016-03-05 | Discharge: 2016-03-05 | Disposition: A | Discharge status: Home / Self Care | Payer: Commercial Managed Care - HMO | Attending: Emergency Medicine | Admitting: Emergency Medicine

## 2016-03-05 DIAGNOSIS — Y939 Activity, unspecified: Secondary | ICD-10-CM | POA: Insufficient documentation

## 2016-03-05 DIAGNOSIS — M545 Low back pain: Secondary | ICD-10-CM | POA: Diagnosis not present

## 2016-03-05 DIAGNOSIS — Y999 Unspecified external cause status: Secondary | ICD-10-CM | POA: Insufficient documentation

## 2016-03-05 DIAGNOSIS — E039 Hypothyroidism, unspecified: Secondary | ICD-10-CM | POA: Insufficient documentation

## 2016-03-05 DIAGNOSIS — Y92002 Bathroom of unspecified non-institutional (private) residence single-family (private) house as the place of occurrence of the external cause: Secondary | ICD-10-CM | POA: Diagnosis not present

## 2016-03-05 DIAGNOSIS — Z79899 Other long term (current) drug therapy: Secondary | ICD-10-CM | POA: Diagnosis not present

## 2016-03-05 DIAGNOSIS — W1839XA Other fall on same level, initial encounter: Secondary | ICD-10-CM | POA: Insufficient documentation

## 2016-03-05 DIAGNOSIS — J60 Coalworker's pneumoconiosis: Secondary | ICD-10-CM | POA: Diagnosis not present

## 2016-03-05 DIAGNOSIS — Z853 Personal history of malignant neoplasm of breast: Secondary | ICD-10-CM | POA: Diagnosis not present

## 2016-03-05 DIAGNOSIS — S3993XA Unspecified injury of pelvis, initial encounter: Secondary | ICD-10-CM | POA: Diagnosis not present

## 2016-03-05 DIAGNOSIS — S3992XA Unspecified injury of lower back, initial encounter: Secondary | ICD-10-CM | POA: Diagnosis not present

## 2016-03-05 DIAGNOSIS — W19XXXA Unspecified fall, initial encounter: Secondary | ICD-10-CM

## 2016-03-05 DIAGNOSIS — M7918 Myalgia, other site: Secondary | ICD-10-CM

## 2016-03-05 DIAGNOSIS — R102 Pelvic and perineal pain: Secondary | ICD-10-CM | POA: Diagnosis not present

## 2016-03-05 DIAGNOSIS — J45909 Unspecified asthma, uncomplicated: Secondary | ICD-10-CM | POA: Insufficient documentation

## 2016-03-05 DIAGNOSIS — M791 Myalgia: Secondary | ICD-10-CM | POA: Diagnosis not present

## 2016-03-05 MED ORDER — ACETAMINOPHEN 500 MG PO TABS
1000.0000 mg | ORAL_TABLET | Freq: Once | ORAL | Status: AC
  Administered 2016-03-05: 1000 mg via ORAL
  Filled 2016-03-05: qty 2

## 2016-03-05 NOTE — ED Notes (Signed)
Patient transported to X-ray 

## 2016-03-05 NOTE — ED Triage Notes (Signed)
Pt reports falling back on her buttocks. Pt also fell yesterday but did not c/o injury. Pt states she is "unable to sit.

## 2016-03-05 NOTE — Discharge Instructions (Signed)
Your CT does not show any broken bones of hip of pelvis.  Tylenol and ibuprofen for pain. Ice or heat packs to supplement.  Return for worsening symptoms, including escalating pain, inability to walk, intractable vomiting, confusion, or any other symptoms concerning to you.

## 2016-03-05 NOTE — ED Notes (Signed)
Denies hitting her head, stated that she landed on her bottom, no bruising noted to bottom

## 2016-03-05 NOTE — ED Provider Notes (Signed)
[facility] DEPT Provider Note   CSN: [id number] Arrival date & time: 03/05/16  1956  By signing my name below, I, Sarah Boyd, attest that this documentation has been prepared under the direction and in the presence of physician practitioner, Forde Dandy, MD. Electronically Signed: Dora Boyd, Scribe. 03/05/2016. 9:37 PM.  History   Chief Complaint Chief Complaint  Patient presents with  . Fall    The history is provided by the patient. No language interpreter was used.     HPI Comments: Sarah Boyd is a 82 y.o. female who presents to the Emergency Department complaining of sudden onset, constant, right buttock pain s/p falling this morning. She states she was standing up at her bathroom sink, lost balance, and fell backwards, landing on her buttocks. She denies hitting her head or losing consciousness. Pt endorses pain exacerbation with general movement, bearing weight on her RLE, and palpation to her right buttock. She states she cannot sit on her right buttock due to pain. She notes she has been able to ambulate since the fall but it is very painful; she ambulates with a walker at baseline. Pt has not tried any medications or treatments PTA. She notes she usually uses Aleve and Tylenol at home for any pain. She denies abdominal pain, CP, SOB, neck pain, numbness/weakness in her lower extremities, neuro deficits, or any other associated symptoms.  Past Medical History:  Diagnosis Date  . Asthma   . History of breast cancer S/P RIGHT MASTECTOMY AND CHEMO 21 YRS AGO (APPROX.  1990)   NO RECURRENCE  . HOH (hard of hearing) NO AIDS  . Hypothyroidism   . Labral tear of shoulder RIGHT SHOULDER  . Right shoulder pain     Patient Active Problem List   Diagnosis Date Noted  . Frequent falls 04/15/2015  . Asthma, chronic 12/05/2012  . Osteoporosis 12/05/2012  . Hypothyroidism 12/05/2012    Past Surgical History:  Procedure Laterality Date  . CATARACT EXTRACTION,  BILATERAL    . EXCISION OF RIGHT CHEST MASS  12-19-1999   BENIGN  . MASTECTOMY  1990  (APPROX.)   RIGHT BREAST CANCER -- NO RECURRENCE  . ORIF BILATERAL WRIST  2009  . SHOULDER ARTHROSCOPY  12/06/2011   Procedure: ARTHROSCOPY SHOULDER;  Surgeon: Magnus Sinning, MD;  Location: [facility];  Service: Orthopedics;  Laterality: Right;  WITH LABRIAL DEBRIDEMENT AND SAD INTERSCALINE BLOCK    OB History    Gravida Para Term Preterm AB Living             0   SAB TAB Ectopic Multiple Live Births                   Home Medications    Prior to Admission medications   Medication Sig Start Date End Date Taking? Authorizing Provider  albuterol (PROVENTIL) (2.5 MG/3ML) 0.083% nebulizer solution Take 3 mLs (2.5 mg total) by nebulization every 6 (six) hours as needed for wheezing or shortness of breath. 09/02/15  Yes Mary-Margaret Hassell Done, FNP  alendronate (FOSAMAX) 70 MG tablet Take 70 mg by mouth every Sunday. Take with a full glass of water on an empty stomach.   Yes Historical Provider, MD  Calcium Carbonate-Vitamin D (CALCIUM 600+D) 600-400 MG-UNIT tablet Take 1 tablet by mouth daily.   Yes Historical Provider, MD  cetirizine (ZYRTEC) 10 MG tablet Take 10 mg by mouth at bedtime.   Yes Historical Provider, MD  Fluticasone-Salmeterol (ADVAIR) 100-50 MCG/DOSE AEPB Inhale 1 puff  into the lungs 2 (two) times daily. 02/04/16  Yes Mary-Margaret Hassell Done, FNP  levothyroxine (SYNTHROID, LEVOTHROID) 25 MCG tablet Take 25 mcg by mouth daily before breakfast.   Yes Historical Provider, MD  montelukast (SINGULAIR) 10 MG tablet Take 10 mg by mouth at bedtime.   Yes Historical Provider, MD  Multiple Vitamin (MULTIVITAMIN WITH MINERALS) TABS tablet Take 1 tablet by mouth daily.   Yes Historical Provider, MD  naproxen sodium (ANAPROX) 220 MG tablet Take 220 mg by mouth every 6 (six) hours as needed (for pain).   Yes Historical Provider, MD    Family History Family History  Problem Relation Age of  Onset  . Stroke Mother     Social History Social History  Substance Use Topics  . Smoking status: Never Smoker  . Smokeless tobacco: Never Used  . Alcohol use No     Allergies   Chocolate and Benadryl [diphenhydramine hcl]   Review of Systems Review of Systems  Respiratory: Negative for shortness of breath.   Cardiovascular: Negative for chest pain.  Gastrointestinal: Negative for abdominal pain.  Musculoskeletal: Positive for myalgias (right buttock). Negative for neck pain.  Neurological: Negative for syncope, weakness and numbness.       Negative for sensation loss.  All other systems reviewed and are negative.   Physical Exam Updated Vital Signs BP 145/66 (BP Location: Left Arm)   Pulse 102   Temp 98.5 F (36.9 C) (Oral)   Resp 16   Ht 5\' 1"  (1.549 m)   Wt 100 lb (45.4 kg)   SpO2 95%   BMI 18.89 kg/m   Physical Exam  Physical Exam  Nursing note and vitals reviewed. Constitutional: Well developed, well nourished, non-toxic, and in no acute distress Head: Normocephalic and atraumatic.  Mouth/Throat: Oropharynx is clear and moist.  Neck: Normal range of motion. Neck supple. No cervical spine tenderness. Cardiovascular: Normal rate and regular rhythm.   Pulmonary/Chest: Effort normal and breath sounds normal.  Abdominal: Soft. There is no tenderness. There is no rebound and no guarding.  Musculoskeletal: Normal range of motion of the right hip. Tenderness to palpation over the coccyx and right posterior pelvis. No appreciable deformities.  Neurological: Alert, no facial droop, fluent speech, moves all extremities symmetrically. Intact sensation to bilateral lower extremities.  Skin: Skin is warm and dry.  Psychiatric: Cooperative  ED Treatments / Results  Labs (all labs ordered are listed, but only abnormal results are displayed) Labs Reviewed - No data to display  EKG  EKG Interpretation None       Radiology Dg Pelvis 1-2 Views  Result Date:  03/05/2016 CLINICAL DATA:  Status post fall back on buttocks, with concern for pelvic injury. Initial encounter. EXAM: PELVIS - 1-2 VIEW COMPARISON:  Left hip CT performed 09/08/2014 FINDINGS: There is no evidence of acute fracture or dislocation. There is chronic deformity of the left superior and inferior pubic rami, as on the prior study, with mild degenerative change at the pubic symphysis. Mild sclerosis is noted at the sacroiliac joints. The femoral heads are seated normally within the respective acetabula, without significant degenerative change. The visualized bowel gas pattern is grossly unremarkable. IMPRESSION: No evidence of acute fracture or dislocation. Electronically Signed   By: Garald Balding M.D.   On: 03/05/2016 21:07   Dg Sacrum/coccyx  Result Date: 03/05/2016 CLINICAL DATA:  Status post fall onto buttocks, with concern for sacrococcygeal injury. Initial encounter. EXAM: SACRUM AND COCCYX - 2+ VIEW COMPARISON:  CT of the lumbar  spine performed 11/21/2013 FINDINGS: The sacrum and coccyx appear grossly intact. There is no evidence of fracture or dislocation. The sacroiliac joints demonstrate mild sclerotic change. Mild degenerative change is noted at the pubic symphysis, with chronic deformity of the left superior and inferior pubic rami. The femoral heads are seated normally within the respective acetabula. Mild degenerative change is noted along the lower lumbar spine, with mild grade 1 anterolisthesis of L5 on S1, reflecting underlying facet disease. IMPRESSION: No evidence of fracture or dislocation. Sacrum and coccyx appear grossly intact. Electronically Signed   By: Garald Balding M.D.   On: 03/05/2016 21:03   Ct Pelvis Wo Contrast  Result Date: 03/05/2016 CLINICAL DATA:  Status post fall, with right buttock pain. Initial encounter. EXAM: CT PELVIS WITHOUT CONTRAST TECHNIQUE: Multidetector CT imaging of the pelvis was performed following the standard protocol without intravenous  contrast. COMPARISON:  Left hip CT performed 09/08/2014, and pelvic radiograph performed earlier today at 8:37 p.m. FINDINGS: Urinary Tract: The bladder is moderately distended and grossly unremarkable in appearance. Bowel: Visualized small and large bowel loops are grossly unremarkable. The appendix is normal in caliber, without evidence of appendicitis. Vascular/Lymphatic: Scattered calcification is noted along the distal abdominal aorta and its branches. No retroperitoneal or pelvic sidewall lymphadenopathy is seen. Visualized inguinal nodes remain normal in size. Reproductive: The uterus is not well assessed. No suspicious adnexal masses are seen. The ovaries appear relatively symmetric. Other: There is no evidence of significant soft tissue injury. No hip joint effusions are seen. Musculoskeletal: There is no evidence of acute fracture or dislocation. Both femoral heads are seated normally within their respective acetabula. No significant degenerative change is noted at the hip joints. There is chronic deformity of the left superior and inferior pubic rami, and mild degenerative change is noted at the pubic symphysis. Degenerative change is noted along the lower lumbar spine, with minimal grade 1 anterolisthesis of L5 on S1, reflecting underlying facet disease, and associated vacuum phenomenon. Mild disc space narrowing is noted at L4-L5, with mild endplate cystic change. The sacroiliac joints are unremarkable in appearance. The right gluteus musculature is unremarkable in appearance. IMPRESSION: 1. No evidence of acute fracture or dislocation. 2. Right gluteus musculature is unremarkable in appearance. 3. Scattered aortic atherosclerosis noted. Electronically Signed   By: Garald Balding M.D.   On: 03/05/2016 22:29    Procedures Procedures (including critical care time)  DIAGNOSTIC STUDIES: Oxygen Saturation is 95% on RA, adequate by my interpretation.    COORDINATION OF CARE: 9:47 PM Discussed  treatment plan with pt at bedside and pt agreed to plan.  Medications Ordered in ED Medications  acetaminophen (TYLENOL) tablet 1,000 mg (1,000 mg Oral Given 03/05/16 2147)     Initial Impression / Assessment and Plan / ED Course  I have reviewed the triage vital signs and the nursing notes.  Pertinent labs & imaging results that were available during my care of the patient were reviewed by me and considered in my medical decision making (see chart for details).  Clinical Course    Presenting after mechanical fall onto buttocks. No head injury. Well-appearing in no acute distress. With coccyx pain to palpation in the posterior right pelvis pain to palpation with some difficulty ambulating. X-rays of the pelvis and coccyx are negative for fracture. Given age, CT performed. This is negative for fracture of pelvis/hip. Pain improved with tylenol. Stable for discharge home. Discussed supportive care instructions. Strict return follow-up instructions are reviewed. She and family expressed understanding  of all discharge instructions, and felt comfortable with the plan of care.  I personally performed the services described in this documentation, which was scribed in my presence. The recorded information has been reviewed and is accurate.   Final Clinical Impressions(s) / ED Diagnoses   Final diagnoses:  Fall, initial encounter  Acute buttock pain    New Prescriptions New Prescriptions   No medications on file     Forde Dandy, MD 03/05/16 2256

## 2016-03-06 DIAGNOSIS — J60 Coalworker's pneumoconiosis: Secondary | ICD-10-CM | POA: Diagnosis not present

## 2016-03-13 ENCOUNTER — Other Ambulatory Visit: Payer: Self-pay | Admitting: Nurse Practitioner

## 2016-03-13 DIAGNOSIS — Z1231 Encounter for screening mammogram for malignant neoplasm of breast: Secondary | ICD-10-CM

## 2016-03-13 DIAGNOSIS — Z9011 Acquired absence of right breast and nipple: Secondary | ICD-10-CM

## 2016-03-27 ENCOUNTER — Ambulatory Visit
Admission: RE | Admit: 2016-03-27 | Discharge: 2016-03-27 | Disposition: A | Discharge status: Home / Self Care | Payer: Commercial Managed Care - HMO | Source: Ambulatory Visit | Attending: Nurse Practitioner | Admitting: Nurse Practitioner

## 2016-03-27 DIAGNOSIS — Z1231 Encounter for screening mammogram for malignant neoplasm of breast: Secondary | ICD-10-CM | POA: Diagnosis not present

## 2016-03-27 DIAGNOSIS — Z9011 Acquired absence of right breast and nipple: Secondary | ICD-10-CM

## 2016-03-29 ENCOUNTER — Encounter: Payer: Self-pay | Admitting: Neurology

## 2016-03-29 ENCOUNTER — Ambulatory Visit (INDEPENDENT_AMBULATORY_CARE_PROVIDER_SITE_OTHER): Payer: Commercial Managed Care - HMO | Admitting: Neurology

## 2016-03-29 VITALS — BP 128/66 | HR 78 | Resp 14 | Ht 61.0 in | Wt 101.0 lb

## 2016-03-29 DIAGNOSIS — R296 Repeated falls: Secondary | ICD-10-CM | POA: Diagnosis not present

## 2016-03-29 DIAGNOSIS — R42 Dizziness and giddiness: Secondary | ICD-10-CM | POA: Diagnosis not present

## 2016-03-29 DIAGNOSIS — R269 Unspecified abnormalities of gait and mobility: Secondary | ICD-10-CM | POA: Diagnosis not present

## 2016-03-29 DIAGNOSIS — W19XXXS Unspecified fall, sequela: Secondary | ICD-10-CM

## 2016-03-29 NOTE — Progress Notes (Signed)
Subjective:    Patient ID: Sarah Boyd is a 82 y.o. female.  HPI     Interim history:   Sarah Boyd is an 82 year old right-handed woman with an underlying medical history of hypothyroidism, breast cancer, hearing loss, arthritis, asthma, and bilateral cataract repairs, who presents for followup consultation of her gait disturbance, balance problems and recurrent falls. She is accompanied by her niece, Sarah Boyd, again today. I last saw her on 10/07/2015, at which time she reported doing about the same, no fall since March 2017. She lives with her niece. Her home health nurse suggested that she should try medication for dizziness. Her nurse also suggested she should change doctors, per niece. Her niece requested a second opinion and I referred her to Dr. Tomi Likens. She saw Dr. Tomi Likens in the interim on 12/06/2015 and I reviewed his note. His impression was intermittent vertigo. Fall precautions were discussed and she was cautioned against meclizine and advised to limit the use of it. She was seen in the emergency room on 08/27/2015 after a fall. I reviewed the records. She fell at the house. She was leaning forward to pick something up and tumbled forward. She hit her right upper face area, no loss of consciousness was reported. Head CT and maxillofacial CT without contrast on 08/27/2015: IMPRESSION: 1. No evidence of acute intracranial abnormality. No calvarial fracture. 2. Lateral right periorbital and right pre-maxillary contusions. No maxillofacial fracture. 3. Mild cerebral volume loss and chronic small vessel ischemia.   In addition, I personally reviewed the images through the PACS system.  Today, 03/29/2016: She reports having fallen. Sarah Boyd reports 2 falls in the last 3 weeks. Of note, she had a recent fall and was seen in the emergency room on 03/05/2016. I reviewed the records. She fell in the bathroom and landed on her buttocks. She complained of right-sided pain. She did not hit her head.  CT pelvis was negative for fracture and x-ray coccyx was negative for fracture as well. She was treated symptomatically with Tylenol and improved. She also fell the day before in the kitchen, was trying to sit on a barstool. Has been through physical therapy outpatient and home health. No sustained improvement after physical therapy.   Previously:   I saw her on 04/08/2015: she feels okay, fell one time in the bathroom, has not injured herself. She has had intermittent issues with feeling off balance. Sometimes she is scared to walk. She gets more insecure when it started. She uses a rolling walker. She could not use the 2 wheeled walker because of uneven grounds and gravel at her niece's house. She does not always drink enough water.   I saw her on 11/04/2014 at which time she reported doing better as far as the pelvic pain and sore L hip goes. She moved in with Sarah Boyd in February 2016 after a fall. If she does not rush, per Sarah Boyd, she does okay. She saw orthopedics, [name], and was released, as she was doing better. She had a swallow study with [name] on 08/31/14, which showed mild aspiration with thin liquids. She was not always drinking enough water.   I saw her on 05/26/2014, at which time Sarah Boyd reported that the patient fell about 3 times in the prior 6 months. Thankfully she had not injured herself. She was not using her walker inside the house. She had no loss of consciousness or head injuries. She had gone to the emergency room with cough and mild fever and chest x-ray  was negative. She was treated with a nebulizer and prescribed prednisone.   In the interim, she presented to the emergency room on 08/02/2014 after fall. I reviewed the emergency room records. She hit her head but had no loss of consciousness. She had a superficial laceration. She had a head CT without contrast on 08/02/2014 as well as a CT cervical spine without contrast which I reviewed: No acute traumatic injury to the  cervical spine. No acute intracranial abnormalities. In addition, personally reviewed the head CT through the PACS system and agree with the findings.   In the interim, she presented back to the emergency room on 09/08/2014 after another fall. I reviewed the hospital emergency room records. She complained of leg pain on the left and also pelvic pain. CT of the left hip without contrast on 09/08/2014 showed: Mildly comminuted mildly displaced left superior pubic ramus fracture. Nondisplaced left inferior pubic ramus fracture. No extension to the acetabulum.    She was symptomatically treated for pain. Her primary care provider referred her to orthopedics in April 2016.   I saw her on 05/12/2013, at which time she was living by herself and I asked her to get a call alert button. I asked her to use her walker at all times for safety. In the interim, she was seen by our nurse practitioner, [name], on 11/24/2013 at which time she was using a cane more consistently. She had gotten a life alert button. She had not fallen recently.   I saw her on 11/05/2012, in which time I asked her to use her walker at all times. I also advised her to look into a life alert button. I felt, she most likely had a multifactorial gait disorder, d/t a combination of intermittent vertigo, arthritis, CNS atherosclerosis, and orthostatic hypotension. In the interim, she fell on 12/01/2012 and broke a rib. She fell against the dresser. Her niece goes to see her at least once a week and helps her with groceries. The patient loves to walk and has been using her walker consistently. She has walked to [employer] and to [employer]. She has been through PT.   I first met her on 08/05/2012, at which time she gave a several year history of gait and balance problems. She fell in 2012 and fractured both wrists. Years ago she fell off the porch and hurt her right shoulder. She has a history of breast cancer on the right, status post right mastectomy  and chemotherapy. She was also complaining of lightheadedness upon standing quickly. She has an underlying medical history of hypothyroidism, osteoporosis, chronic lung disease, past history of breast cancer, status post right mastectomy and chemotherapy. At the time of her first visit with me she displayed mild gait insecurity but no frank ataxia. I felt she had multifactorial gait disturbance due to arthritis, cerebrovascular atherosclerosis, orthostatic hypotension, advanced age, and possible intermittent vertigo. I suggested a brain MRI with and without contrast and recommended physical therapy for gait and balance training. She was advised to use meclizine as needed. She has not been using it and was not sure if it was helpful.   Her brain MRI from 08/15/12 showed mild changes of age-appropriate generalized cerebral atrophy and chronic microvascular ischemia.   She lives in a 2 storey house by herself. If she gets up too quickly, she feels off balance and lightheaded. She was in PT in [place], [place], but I do not have a report available. She recently had a check up with  her PCP.    Her Past Medical History Is Significant For: Past Medical History:  Diagnosis Date  . Asthma   . History of breast cancer S/P RIGHT MASTECTOMY AND CHEMO 21 YRS AGO (APPROX.  1990)   NO RECURRENCE  . HOH (hard of hearing) NO AIDS  . Hypothyroidism   . Labral tear of shoulder RIGHT SHOULDER  . Right shoulder pain     Her Past Surgical History Is Significant For: Past Surgical History:  Procedure Laterality Date  . CATARACT EXTRACTION, BILATERAL    . EXCISION OF RIGHT CHEST MASS  12-19-1999   BENIGN  . MASTECTOMY  1990  (APPROX.)   RIGHT BREAST CANCER -- NO RECURRENCE  . ORIF BILATERAL WRIST  2009  . SHOULDER ARTHROSCOPY  12/06/2011   Procedure: ARTHROSCOPY SHOULDER;  Surgeon: Magnus Sinning, MD;  Location: [facility];  Service: Orthopedics;  Laterality: Right;  WITH LABRIAL DEBRIDEMENT AND  SAD INTERSCALINE BLOCK    Her Family History Is Significant For: Family History  Problem Relation Age of Onset  . Stroke Mother     Her Social History Is Significant For: Social History   Social History  . Marital status: Single    Spouse name: N/A  . Number of children: 0  . Years of education: College   Occupational History  . Retired    Social History Main Topics  . Smoking status: Never Smoker  . Smokeless tobacco: Never Used  . Alcohol use No  . Drug use: No  . Sexual activity: Not Asked   Other Topics Concern  . None   Social History Narrative   Patient lives at home alone.   Caffeine Use; none    Her Allergies Are:  Allergies  Allergen Reactions  . Chocolate Shortness Of Breath  . Benadryl [Diphenhydramine Hcl] Other (See Comments)    Reaction:  Altered mental status   :   Her Current Medications Are:  Outpatient Encounter Prescriptions as of 03/29/2016  Medication Sig  . albuterol (PROVENTIL) (2.5 MG/3ML) 0.083% nebulizer solution Take 3 mLs (2.5 mg total) by nebulization every 6 (six) hours as needed for wheezing or shortness of breath.  Marland Kitchen alendronate (FOSAMAX) 70 MG tablet Take 70 mg by mouth every Sunday. Take with a full glass of water on an empty stomach.  . Calcium Carbonate-Vitamin D (CALCIUM 600+D) 600-400 MG-UNIT tablet Take 1 tablet by mouth daily.  . cetirizine (ZYRTEC) 10 MG tablet Take 10 mg by mouth at bedtime.  . Fluticasone-Salmeterol (ADVAIR) 100-50 MCG/DOSE AEPB Inhale 1 puff into the lungs 2 (two) times daily.  Marland Kitchen levothyroxine (SYNTHROID, LEVOTHROID) 25 MCG tablet Take 25 mcg by mouth daily before breakfast.  . montelukast (SINGULAIR) 10 MG tablet Take 10 mg by mouth at bedtime.  . Multiple Vitamin (MULTIVITAMIN WITH MINERALS) TABS tablet Take 1 tablet by mouth daily.  . [DISCONTINUED] naproxen sodium (ANAPROX) 220 MG tablet Take 220 mg by mouth every 6 (six) hours as needed (for pain).   No facility-administered encounter  medications on file as of 03/29/2016.   :  Review of Systems:  Out of a complete 14 point review of systems, all are reviewed and negative with the exception of these symptoms as listed below: Review of Systems  Neurological:       Patient has fallen 2x in last 3 weeks.     Objective:  Neurologic Exam  Physical Exam Physical Examination:   Vitals:   03/29/16 1306  BP: 128/66  Pulse: 78  Resp: 14    General Examination: The patient is a very pleasant 82 y.o. female in no acute distress. She appears frail, but weight is stable, slightly up.   HEENT: Normocephalic, atraumatic, pupils are equal, round and reactive to light and accommodation. Hearing is impaired bilaterally. She has bilateral cataract repairs. She has a hearing aid on the right. Neck is supple with full range of motion. She has no symptoms of vertigo upon sudden changes in head position. Extraocular tracking is good without nystagmus noted. Speech is clear. Oropharynx examination reveals mild to moderate mouth dryness otherwise no significant findings. Tongue protrudes centrally and palate elevates symmetrically. Neck auscultation reveals no carotid bruits.  Chest is clear to auscultation without wheezing or rhonchi noted.  Heart sounds are normal without murmurs, rubs or gallops noted. Abdomen is soft, nontender with normal bowel sounds noted. She has no pitting edema in the distal lower extremities. Skin is warm and dry. I do not appreciate any trophic changes but she has R knee swelling and pain She has mild arthritic changes in her hands.  Neurologically: Mental status: The patient is awake, alert and oriented in all 4 spheres. Her memory, attention, language and knowledge are age appropriate. Cranial nerves are as described above. Motor exam reveals normal bulk and strength for age. She perhaps has a slight degree of hip flexor weakness bilaterally, unchanged. Reflexes are 1+ throughout including her ankles. Cerebellar  testing shows no dysmetria or intention tremor on finger to nose testing and no truncal ataxia. Sensory exam is intact to light touch. She stands slowly. She walks slowly and cautiously. She turns slowly and uses a rolling walker.            Assessment and Plan:   In summary, Sarah Boyd is an 82 year old lady with an underlying medical history of hypothyroidism, breast cancer, hearing loss, arthritis, asthma, and bilateral cataract repairs, who presents for followup consultation of her gait disturbance, balance problems and recurrent falls, including injuries from falls. She fell most recently twice in September 2017, went to the ER in September and I reviewed the records and scans. In the past, she has fallen and hit her head and needed staples and she also fell and broke her pelvis. She is using her rolling walker more but not consistently and has had intermittent vertiginous symptoms but also dizziness and lightheadedness. She does not always hydrate well. On Wednesdays she typically fasts and does not eat anything throughout the day. She is advised that it may not be safe for her to fast, as she may become dehydrated and hypoglycemic. I think her gait disorder is most likely secondary to a combination of factors including arthritis, aging, prior falls with injuries, dehydration, fluctuating blood pressure and blood sugar levels. Her physical exam is stable.  We then requested a second opinion and she recently saw Dr. Tomi Likens at [facility] neurology. She has moved in with her niece. Her memory and her other physical and neurological exam appears stable otherwise. She had an MRI in 2014 with age appropriate findings and had a CTH and face.  I will refer her to physical therapy again. We will request PT in [place], [place], she may be able to go once a week, her niece will try to take her. She is advised to use her walker at all times and never to walk backwards. She is advised to stay well-hydrated and  well-nourished and not to skip any meals. I will see her back routinely in  6 months, sooner if needed. I answered all their questions today and the patient and her niece were in agreement.  I spent 25 minutes in total face-to-face time with the patient, more than 50% of which was spent in counseling and coordination of care, reviewing test results, reviewing medication and discussing or reviewing the diagnosis of gait d/o, its prognosis and treatment options.

## 2016-03-29 NOTE — Patient Instructions (Signed)
Unfortunately, dizziness is a very common complaint but is often not due to a primary neurological reason or single underlying medical problem. Often it is a combination of things that result in dizziness, this includes blood pressure fluctuations, arthritis, medication side effects, blood sugar fluctuations, stress, vertigo, poor sleep, and electrolyte disturbance or other metabolic and endocrinological, meaning hormone related problems such as thyroid dysfunction.  Use your walker at ALL TIMES, DO NOT WALK BACKWARDS!

## 2016-04-03 DIAGNOSIS — H5213 Myopia, bilateral: Secondary | ICD-10-CM | POA: Diagnosis not present

## 2016-04-03 DIAGNOSIS — Z961 Presence of intraocular lens: Secondary | ICD-10-CM | POA: Diagnosis not present

## 2016-04-03 DIAGNOSIS — H04123 Dry eye syndrome of bilateral lacrimal glands: Secondary | ICD-10-CM | POA: Diagnosis not present

## 2016-04-03 DIAGNOSIS — H43813 Vitreous degeneration, bilateral: Secondary | ICD-10-CM | POA: Diagnosis not present

## 2016-04-11 ENCOUNTER — Ambulatory Visit: Payer: Commercial Managed Care - HMO | Attending: Neurology

## 2016-04-11 DIAGNOSIS — M6281 Muscle weakness (generalized): Secondary | ICD-10-CM | POA: Diagnosis not present

## 2016-04-11 DIAGNOSIS — R2689 Other abnormalities of gait and mobility: Secondary | ICD-10-CM | POA: Diagnosis not present

## 2016-04-11 DIAGNOSIS — R296 Repeated falls: Secondary | ICD-10-CM | POA: Insufficient documentation

## 2016-04-11 DIAGNOSIS — R42 Dizziness and giddiness: Secondary | ICD-10-CM

## 2016-04-12 NOTE — Therapy (Signed)
[facility] [address] [place], [place], [address] Phone: [phone]   Fax:  [phone]  Physical Therapy Evaluation  Patient Details  Name: Sarah Boyd MRN: [id number] Date of Birth: 21-Feb-1930 Referring Provider: Dr. Rexene Alberts  Encounter Date: 04/11/2016      PT End of Session - 04/12/16 1126    Visit Number 1   Number of Visits 17   Date for PT Re-Evaluation 06/10/16   Authorization Type [employer]: G-CODE AND PROGRESS NOTE EVERY 10TH VISIT. Waiting for auth info.   PT Start Time 1320   PT Stop Time 1401   PT Time Calculation (min) 41 min   Equipment Utilized During Treatment Gait belt   Activity Tolerance Patient tolerated treatment well   Behavior During Therapy WFL for tasks assessed/performed      Past Medical History:  Diagnosis Date  . Asthma   . History of breast cancer S/P RIGHT MASTECTOMY AND CHEMO 21 YRS AGO (APPROX.  1990)   NO RECURRENCE  . HOH (hard of hearing) NO AIDS  . Hypothyroidism   . Labral tear of shoulder RIGHT SHOULDER  . Right shoulder pain     Past Surgical History:  Procedure Laterality Date  . CATARACT EXTRACTION, BILATERAL    . EXCISION OF RIGHT CHEST MASS  12-19-1999   BENIGN  . MASTECTOMY  1990  (APPROX.)   RIGHT BREAST CANCER -- NO RECURRENCE  . ORIF BILATERAL WRIST  2009  . SHOULDER ARTHROSCOPY  12/06/2011   Procedure: ARTHROSCOPY SHOULDER;  Surgeon: Magnus Sinning, MD;  Location: [facility];  Service: Orthopedics;  Laterality: Right;  WITH LABRIAL DEBRIDEMENT AND SAD INTERSCALINE BLOCK    There were no vitals filed for this visit.       Subjective Assessment - 04/11/16 1327    Subjective Pt reports she has experienced approx. 3 falls in the last 6 months, with pt reporting she's most unsteady when reaching outside BOS and walking without rollator. Pt went to the ED after the last fall, and pt reported she did not have any fx's.  Pt's  niece reports pt walks at home with rollator and holds onto furniture. Pt reports intermittent dizziness, especially after being out of the house for prolonged periods of time, pt describes dizziness as wooziness and lightheadedness. Pt reports she does not drink much water, as it makes her use the bathroom more frequently.   Patient is accompained by: Family member  Sarah Boyd-niece   Pertinent History Hx of R breast CA, asthma, HOH, osteoporsis, OA, hypothyroidism, Hx of R shoulder labral tear (unsure of the year but states it was a long time ago), Hx of pelvic fx   Patient Stated Goals Walk better   Currently in Pain? No/denies            [facility] PT Assessment - 04/11/16 1334      Assessment   Medical Diagnosis Recurrent falls, fall with significant injury, gait disorder, dizziness and giddiness, intermittent vertigo   Referring Provider Dr. Rexene Alberts   Onset Date/Surgical Date 04/12/15   Hand Dominance Right   Prior Therapy OPPT for balance     Precautions   Precautions Fall  osteoporosis (hx of pelvic fx ~1 year ago)     Restrictions   Weight Bearing Restrictions No     Balance Screen   Has the patient fallen in the past 6 months Yes   How many times? 3   Has the patient had a decrease  in activity level because of a fear of falling?  Yes   Is the patient reluctant to leave their home because of a fear of falling?  No     Home Environment   Living Environment Private residence   Living Arrangements Other relatives  niece   Available Help at Discharge Family   Type of Home Mobile home  double wide   Laurel seat;Walker - 4 wheels  2 rollators     Prior Function   Level of Independence Independent   Leisure going to church     Cognition   Overall Cognitive Status Within Functional Limits for tasks assessed     Observation/Other Assessments   Focus on Therapeutic Outcomes (FOTO)  DHI: 50% indicates pt  perceives dizziness has moderate impact on functional abilities.     Sensation   Light Touch Appears Intact   Additional Comments No N/T but pt reports B foot cramps at night.     Coordination   Gross Motor Movements are Fluid and Coordinated Yes   Fine Motor Movements are Fluid and Coordinated Yes     Posture/Postural Control   Posture/Postural Control Postural limitations   Postural Limitations Forward head;Flexed trunk     ROM / Strength   AROM / PROM / Strength AROM;Strength     AROM   Overall AROM  Deficits   Overall AROM Comments R UE shoulder flex, IR, ER limited 2/2 hx of labral tear. Otherwise, WFL (LUE and B LE)     Strength   Overall Strength Deficits   Overall Strength Comments Gross B UE strength WFL. B LE MMT: hip flex: 4/5, knee ext: 4+/5, knee flex: 4/5, ankle DF: 4/5. Hip abd/add in seated position: 4/5. However, B hip abd/ext weakness suspected 2/2 gait deviations.     Transfers   Transfers Sit to Stand;Stand to Sit   Sit to Stand 5: Supervision;With upper extremity assist;From chair/3-in-1   Stand to Sit 5: Supervision;With upper extremity assist;To chair/3-in-1     Ambulation/Gait   Ambulation/Gait Yes   Ambulation/Gait Assistance 5: Supervision   Ambulation/Gait Assistance Details No overt LOB, but turns in slow manner for safety.   Ambulation Distance (Feet) 75 Feet   Assistive device Rollator   Gait Pattern Step-through pattern;Decreased stride length;Trunk flexed   Ambulation Surface Level;Indoor   Gait velocity 1.37ft/sec. with rollator     Standardized Balance Assessment   Standardized Balance Assessment Timed Up and Go Test     Timed Up and Go Test   TUG Normal TUG   Normal TUG (seconds) 29.2  with rollator                           PT Education - 04/12/16 1125    Education provided Yes   Education Details PT discussed frequency/duration and outcome measure results. Pt stated she can only come to appt's 1x/week due to  driving distance. PT reiterated the importance of eating and staying hydrated to avoid dehydration and that dizziness might be 2/2 dehydration. PT also educated pt on having someone lower the doorway thresholds at home, so she can traverse over thresholds with rollator.    Person(s) Educated Patient;Other (comment)  niece:Sarah Boyd   Methods Explanation   Comprehension Verbalized understanding          PT Short Term Goals - 04/12/16 1142      PT SHORT  TERM GOAL #1   Title Pt will be IND in HEP to improve balance, reduce dizziness, and improve strength. TARGET DATE FOR ALL STGS: 05/09/16   Status New     PT SHORT TERM GOAL #2   Title Pt will improve gait speed with LRAD to >/=2.38ft/sec to improve functional mobility.    Status New     PT SHORT TERM GOAL #3   Title Perform DGI and write goal.    Status New     PT SHORT TERM GOAL #4   Title Pt will amb. 300' over even terrain, while performing head turns, at MOD I level with LRAD to improve functional mobility.    Status New           PT Long Term Goals - 04/12/16 1145      PT LONG TERM GOAL #1   Title Pt will verbalize understanding of fall prevention strategies to decr. falls risk. TARGET DATE FOR ALL LTGS: 06/06/16   Status New     PT LONG TERM GOAL #2   Title Pt will amb. 600' over even and paved surfaces with LRAD, at MOD I level in order to improve functional mobility and attend church.    Status New     PT LONG TERM GOAL #3   Title Pt will amb. >/=2.18ft/sec. with LRAD to amb. safely in the community.    Status New     PT LONG TERM GOAL #4   Title Pt will improve DHI score from 50% to 32% to improve quality of life and to reduce impact of dizziness on functional abilities.    Status New               Plan - 04/12/16 1132    Clinical Impression Statement Pt is a pleasant 82y/o female presenting to OPPT neuro for falls and dizziness. Pt's PMH significant for the following: Hx of R breast CA, hx of  dizziness, asthma, HOH, osteoporsis, OA, hypothyroidism, Hx of R shoulder labral tear (unsure of the year but states it was a long time ago), Hx of pelvic fx. Pt presented with the following deficits: gait deviations, impaired balance, impaired strength, limited RUE AROM 2/2 hx of R shoulder labral tear, decr. endurance and impaired flexibility. Pt's gait speed with slightly above the falls risk cut off, however, her TUG time and hx of falls places pt at risk for falls. Pt's gait speed indicates she is not able to safely amb. in the community. PT will formally assess balance with DGI balance test next session and perform vestibular assessment.    Rehab Potential Good   Clinical Impairments Affecting Rehab Potential co-morbidities and pt only able to come 1x/week due to transportation and distance   PT Frequency 2x / week   PT Duration 8 weeks   PT Treatment/Interventions ADLs/Self Care Home Management;Biofeedback;Canalith Repostioning;Neuromuscular re-education;Balance training;Therapeutic exercise;Therapeutic activities;Manual techniques;Functional mobility training;Stair training;Gait training;DME Instruction;Patient/family education;Vestibular   PT Next Visit Plan Perform vestibular assessment and DGI. Initiate balance/strength/flexibility HEP. Provide falls risk handout.   Consulted and Agree with Plan of Care Patient;Family member/caregiver   Family Member Consulted niece: Sarah Boyd      Patient will benefit from skilled therapeutic intervention in order to improve the following deficits and impairments:  Abnormal gait, Decreased endurance, Decreased strength, Decreased balance, Decreased mobility, Dizziness, Decreased range of motion, Impaired flexibility, Postural dysfunction, Impaired UE functional use  Visit Diagnosis: Other abnormalities of gait and mobility - Plan: PT plan of care cert/re-cert  Dizziness and giddiness - Plan: PT plan of care cert/re-cert  Muscle weakness (generalized) -  Plan: PT plan of care cert/re-cert  Repeated falls - Plan: PT plan of care cert/re-cert      G-Codes - 0000000 [id number]    Functional Assessment Tool Used Gait speed with rollator: 1.27ft/sec.; TUG time with rollator: 29.2 sec.   Functional Limitation Mobility: Walking and moving around   Mobility: Walking and Moving Around Current Status [phone]) At least 40 percent but less than 60 percent impaired, limited or restricted   Mobility: Walking and Moving Around Goal Status [phone]) At least 1 percent but less than 20 percent impaired, limited or restricted       Problem List Patient Active Problem List   Diagnosis Date Noted  . Frequent falls 04/15/2015  . Asthma, chronic 12/05/2012  . Osteoporosis 12/05/2012  . Hypothyroidism 12/05/2012    Requan Hardge L 04/12/2016, 11:49 AM  [facility] [address] [place], [place], [address] Phone: [phone]   Fax:  [phone]  Name: Sarah Boyd MRN: [id number] Date of Birth: 1929/07/20   Geoffry Paradise, PT,DPT 04/12/16 11:50 AM Phone: [phone] Fax: [phone]

## 2016-04-25 ENCOUNTER — Ambulatory Visit: Payer: Commercial Managed Care - HMO | Attending: Neurology

## 2016-04-25 DIAGNOSIS — R42 Dizziness and giddiness: Secondary | ICD-10-CM | POA: Diagnosis not present

## 2016-04-25 DIAGNOSIS — R2689 Other abnormalities of gait and mobility: Secondary | ICD-10-CM | POA: Insufficient documentation

## 2016-04-25 DIAGNOSIS — R296 Repeated falls: Secondary | ICD-10-CM | POA: Diagnosis not present

## 2016-04-25 DIAGNOSIS — M6281 Muscle weakness (generalized): Secondary | ICD-10-CM | POA: Insufficient documentation

## 2016-04-25 NOTE — Therapy (Signed)
[facility] [address] [place], [place], [address] Phone: [phone]   Fax:  [phone]  Physical Therapy Treatment  Patient Details  Name: Sarah Boyd MRN: [id number] Date of Birth: 15-Feb-1930 Referring Provider: Dr. Rexene Alberts  Encounter Date: 04/25/2016      PT End of Session - 04/25/16 1608    Visit Number 3   Number of Visits 17   Date for PT Re-Evaluation 06/10/16   Authorization Type [employer] Medicare: G-CODE AND PROGRESS NOTE EVERY 10TH VISIT. Waiting for auth info.   PT Start Time 0932   PT Stop Time 1015   PT Time Calculation (min) 43 min   Activity Tolerance Patient tolerated treatment well   Behavior During Therapy WFL for tasks assessed/performed      Past Medical History:  Diagnosis Date  . Asthma   . History of breast cancer S/P RIGHT MASTECTOMY AND CHEMO 21 YRS AGO (APPROX.  1990)   NO RECURRENCE  . HOH (hard of hearing) NO AIDS  . Hypothyroidism   . Labral tear of shoulder RIGHT SHOULDER  . Right shoulder pain     Past Surgical History:  Procedure Laterality Date  . CATARACT EXTRACTION, BILATERAL    . EXCISION OF RIGHT CHEST MASS  12-19-1999   BENIGN  . MASTECTOMY  1990  (APPROX.)   RIGHT BREAST CANCER -- NO RECURRENCE  . ORIF BILATERAL WRIST  2009  . SHOULDER ARTHROSCOPY  12/06/2011   Procedure: ARTHROSCOPY SHOULDER;  Surgeon: Magnus Sinning, MD;  Location: [facility];  Service: Orthopedics;  Laterality: Right;  WITH LABRIAL DEBRIDEMENT AND SAD INTERSCALINE BLOCK    There were no vitals filed for this visit.      Subjective Assessment - 04/25/16 0934    Subjective Pt reported dizziness still comes and goes. Pt denied falls or changes since last visit. Pt reports intermittent dizziness during supine to sit txfs.   Patient is accompained by: Family member  [name]-niece   Pertinent History Must have liquids thickened (1 powder scoop for 8oz.) per pt's niece, Hx of  R breast CA, asthma, HOH, osteoporsis, OA, hypothyroidism, Hx of R shoulder labral tear (unsure of the year but states it was a long time ago), Hx of pelvic fx   Patient Stated Goals Walk better   Currently in Pain? No/denies                Vestibular Assessment - 04/25/16 0935      Symptom Behavior   Type of Dizziness Comment  wooziness   Frequency of Dizziness 2-3 times per week   Duration of Dizziness A few minutes per pt but pt's niece reports up to an hour (especially after church)   Aggravating Factors Turning head quickly  coming back from church   Relieving Factors Rest     Occulomotor Exam   Occulomotor Alignment Normal   Spontaneous Absent   Gaze-induced Absent   Smooth Pursuits Intact   Saccades Intact   Comment Pt reported "wobbly" feeling during smooth pursuits and saccades.     Vestibulo-Occular Reflex   VOR 1 Head Only (x 1 viewing) 3-4/10 wooziness, no saccades noted     Orthostatics   BP supine (x 5 minutes) 126/65   HR supine (x 5 minutes) 68   BP sitting 128/69   HR sitting 71  pt reported wooziness during supine to sit (3/10)   BP standing (after 1 minute) 120/59   HR standing (after 1 minute)  75  pt reported 4/10 wooziness during standing "I feel drunk"                    Self Care:     PT Education - 04/25/16 0951    Education provided Yes   Education Details PT reiterated the importance of staying hydrated and to wait on EOB prior to standing/walking if she feels dizzy. PT provided and educated pt on fall prevention handout and orthostatic hypotension handout. PT discussed vestibular exam findings.    Person(s) Educated Patient;Other (comment)  niece-[name]   Methods Explanation   Comprehension Verbalized understanding          PT Short Term Goals - 04/12/16 1142      PT SHORT TERM GOAL #1   Title Pt will be IND in HEP to improve balance, reduce dizziness, and improve strength. TARGET DATE FOR ALL STGS: 05/09/16    Status New     PT SHORT TERM GOAL #2   Title Pt will improve gait speed with LRAD to >/=2.29ft/sec to improve functional mobility.    Status New     PT SHORT TERM GOAL #3   Title Perform DGI and write goal.    Status New     PT SHORT TERM GOAL #4   Title Pt will amb. 300' over even terrain, while performing head turns, at MOD I level with LRAD to improve functional mobility.    Status New           PT Long Term Goals - 04/12/16 1145      PT LONG TERM GOAL #1   Title Pt will verbalize understanding of fall prevention strategies to decr. falls risk. TARGET DATE FOR ALL LTGS: 06/06/16   Status New     PT LONG TERM GOAL #2   Title Pt will amb. 600' over even and paved surfaces with LRAD, at MOD I level in order to improve functional mobility and attend church.    Status New     PT LONG TERM GOAL #3   Title Pt will amb. >/=2.26ft/sec. with LRAD to amb. safely in the community.    Status New     PT LONG TERM GOAL #4   Title Pt will improve DHI score from 50% to 32% to improve quality of life and to reduce impact of dizziness on functional abilities.    Status New               Plan - 04/25/16 1608    Clinical Impression Statement Today's skilled session focused on assessing for dizziness. Pt's did experience s/s consistent with orthostatic hypotension, as her diastolic BP did decr. >/=33mmHg and pt reported lightheadedness/wooziness during supine to sit and sit to stand txfs. Pt also experienced slight wooziness during VOR, and PT will provide habituation HEP next session to address wooziness. Continue with POC.    Rehab Potential Good   Clinical Impairments Affecting Rehab Potential co-morbidities and pt only able to come 1x/week due to transportation and distance   PT Frequency 2x / week   PT Duration 8 weeks   PT Treatment/Interventions ADLs/Self Care Home Management;Biofeedback;Canalith Repostioning;Neuromuscular re-education;Balance training;Therapeutic  exercise;Therapeutic activities;Manual techniques;Functional mobility training;Stair training;Gait training;DME Instruction;Patient/family education;Vestibular   PT Next Visit Plan Perform DGI and write goal prn. Initiate balance/strength/flexibility HEP.    Consulted and Agree with Plan of Care Patient;Family member/caregiver   Family Member Consulted niece: [name]      Patient will benefit from skilled therapeutic intervention in  order to improve the following deficits and impairments:  Abnormal gait, Decreased endurance, Decreased strength, Decreased balance, Decreased mobility, Dizziness, Decreased range of motion, Impaired flexibility, Postural dysfunction, Impaired UE functional use  Visit Diagnosis: Dizziness and giddiness  Other abnormalities of gait and mobility     Problem List Patient Active Problem List   Diagnosis Date Noted  . Frequent falls 04/15/2015  . Asthma, chronic 12/05/2012  . Osteoporosis 12/05/2012  . Hypothyroidism 12/05/2012    Lovinia Snare L 04/25/2016, 4:11 PM  [facility] [address] [place], [place], [address] Phone: [phone]   Fax:  [phone]  Name: Sarah Boyd MRN: [id number] Date of Birth: Jun 05, 1930  Geoffry Paradise, PT,DPT 04/25/16 4:12 PM Phone: [phone] Fax: [phone]

## 2016-04-25 NOTE — Patient Instructions (Signed)
Fall Prevention in the Home Falls can cause injuries and can affect people from all age groups. There are many simple things that you can do to make your home safe and to help prevent falls. What can I do on the outside of my home?  Regularly repair the edges of walkways and driveways and fix any cracks.  Remove high doorway thresholds.  Trim any shrubbery on the main path into your home.  Use bright outdoor lighting.  Clear walkways of debris and clutter, including tools and rocks.  Regularly check that handrails are securely fastened and in good repair. Both sides of any steps should have handrails.  Install guardrails along the edges of any raised decks or porches.  Have leaves, snow, and ice cleared regularly.  Use sand or salt on walkways during winter months.  In the garage, clean up any spills right away, including grease or oil spills. What can I do in the bathroom?  Use night lights.  Install grab bars by the toilet and in the tub and shower. Do not use towel bars as grab bars.  Use non-skid mats or decals on the floor of the tub or shower.  If you need to sit down while you are in the shower, use a plastic, non-slip stool.  Keep the floor dry. Immediately clean up any water that spills on the floor.  Remove soap buildup in the tub or shower on a regular basis.  Attach bath mats securely with double-sided non-slip rug tape.  Remove throw rugs and other tripping hazards from the floor. What can I do in the bedroom?  Use night lights.  Make sure that a bedside light is easy to reach.  Do not use oversized bedding that drapes onto the floor.  Have a firm chair that has side arms to use for getting dressed.  Remove throw rugs and other tripping hazards from the floor. What can I do in the kitchen?  Clean up any spills right away.  Avoid walking on wet floors.  Place frequently used items in easy-to-reach places.  If you need to reach for something above  you, use a sturdy step stool that has a grab bar.  Keep electrical cables out of the way.  Do not use floor polish or wax that makes floors slippery. If you have to use wax, make sure that it is non-skid floor wax.  Remove throw rugs and other tripping hazards from the floor. What can I do in the stairways?  Do not leave any items on the stairs.  Make sure that there are handrails on both sides of the stairs. Fix handrails that are broken or loose. Make sure that handrails are as long as the stairways.  Check any carpeting to make sure that it is firmly attached to the stairs. Fix any carpet that is loose or worn.  Avoid having throw rugs at the top or bottom of stairways, or secure the rugs with carpet tape to prevent them from moving.  Make sure that you have a light switch at the top of the stairs and the bottom of the stairs. If you do not have them, have them installed. What are some other fall prevention tips?  Wear closed-toe shoes that fit well and support your feet. Wear shoes that have rubber soles or low heels.  When you use a stepladder, make sure that it is completely opened and that the sides are firmly locked. Have someone hold the ladder while you are using   it. Do not climb a closed stepladder.  Add color or contrast paint or tape to grab bars and handrails in your home. Place contrasting color strips on the first and last steps.  Use mobility aids as needed, such as canes, walkers, scooters, and crutches.  Turn on lights if it is dark. Replace any light bulbs that burn out.  Set up furniture so that there are clear paths. Keep the furniture in the same spot.  Fix any uneven floor surfaces.  Choose a carpet design that does not hide the edge of steps of a stairway.  Be aware of any and all pets.  Review your medicines with your healthcare provider. Some medicines can cause dizziness or changes in blood pressure, which increase your risk of falling. Talk with  your health care provider about other ways that you can decrease your risk of falls. This may include working with a physical therapist or trainer to improve your strength, balance, and endurance. This information is not intended to replace advice given to you by your health care provider. Make sure you discuss any questions you have with your health care provider. Document Released: 05/19/2002 Document Revised: 10/26/2015 Document Reviewed: 07/03/2014 Elsevier Interactive Patient Education  2017 [employer].    Hypotension As your heart beats, it forces blood through your body. This force is called blood pressure. If you have hypotension, you have low blood pressure. When your blood pressure is too low, you may not get enough blood to your brain. You may feel weak, feel light-headed, have a fast heartbeat, or even pass out (faint). Follow these instructions at home: Eating and drinking  Drink enough fluids to keep your pee (urine) clear or pale yellow.  Eat a healthy diet, and follow instructions from your doctor about eating or drinking restrictions. A healthy diet includes:  Fresh fruits and vegetables.  Whole grains.  Low-fat (lean) meats.  Low-fat dairy products.  Eat extra salt only as told. Do not add extra salt to your diet unless your doctor tells you to.  Eat small meals often.  Avoid standing up quickly after you eat. Medicines  Take over-the-counter and prescription medicines only as told by your doctor.  Follow instructions from your doctor about changing how much you take (the dosage) of your medicines, if this applies.  Do not stop or change your medicine on your own. General instructions  Wear compression stockings as told by your doctor.  Get up slowly from lying down or sitting.  Avoid hot showers and a lot of heat as told by your doctor.  Return to your normal activities as told by your doctor. Ask what activities are safe for you.  Do not use any  products that contain nicotine or tobacco, such as cigarettes and e-cigarettes. If you need help quitting, ask your doctor.  Keep all follow-up visits as told by your doctor. This is important. Contact a doctor if:  You throw up (vomit).  You have watery poop (diarrhea).  You have a fever for more than 2-3 days.  You feel more thirsty than normal.  You feel weak and tired. Get help right away if:  You have chest pain.  You have a fast or irregular heartbeat.  You lose feeling (get numbness) in any part of your body.  You cannot move your arms or your legs.  You have trouble talking.  You get sweaty or feel light-headed.  You faint.  You have trouble breathing.  You have trouble staying  awake.  You feel confused. This information is not intended to replace advice given to you by your health care provider. Make sure you discuss any questions you have with your health care provider. Document Released: 08/23/2009 Document Revised: 02/15/2016 Document Reviewed: 02/15/2016 Elsevier Interactive Patient Education  2017 [employer].

## 2016-05-02 ENCOUNTER — Encounter: Payer: Self-pay | Admitting: Physical Therapy

## 2016-05-02 ENCOUNTER — Ambulatory Visit: Payer: Commercial Managed Care - HMO | Admitting: Physical Therapy

## 2016-05-02 DIAGNOSIS — R296 Repeated falls: Secondary | ICD-10-CM

## 2016-05-02 DIAGNOSIS — R2689 Other abnormalities of gait and mobility: Secondary | ICD-10-CM | POA: Diagnosis not present

## 2016-05-02 DIAGNOSIS — R42 Dizziness and giddiness: Secondary | ICD-10-CM

## 2016-05-02 DIAGNOSIS — M6281 Muscle weakness (generalized): Secondary | ICD-10-CM | POA: Diagnosis not present

## 2016-05-02 NOTE — Patient Instructions (Addendum)
Sit to Side-Lying    Sit on edge of bed. 1. Turn head 45 to right. 2. Maintain head position and lie down slowly on left side. Hold until symptoms subside. 3. Sit up slowly. Hold until symptoms subside. 4. Turn head 45 to left. 5. Maintain head position and lie down slowly on right side. Hold until symptoms subside. 6. Sit up slowly. Repeat sequence __3__ times per session. Do __1__ sessions per day.  Copyright  VHI. All rights reserved.  Bridge    Lie back, legs bent. Inhale, pressing hips up. Keeping ribs in, lengthen lower back. Exhale, rolling down along spine from top. Repeat _10__ times. Do __1__ sessions per day.  http://pm.exer.us/55   Copyright  VHI. All rights reserved.  Abduction: Clam (Eccentric) - Side-Lying    Lie on side with knees bent. Lift top knee, keeping feet together. Keep trunk steady. Slowly lower for 3-5 seconds. _10__ reps per set, _1__ sets per day.  http://ecce.exer.us/65   Copyright  VHI. All rights reserved.  Hip Flexion / Knee Extension: Straight-Leg Raise (Eccentric)    Lie on back. Lift leg with knee straight. Slowly lower leg for 3-5 seconds. _10_ reps per set, _1__ sets per day. Copyright  VHI. All rights reserved.

## 2016-05-02 NOTE — Therapy (Signed)
[facility] [address] [place], [place], [address] Phone: [phone]   Fax:  [phone]  Physical Therapy Treatment  Patient Details  Name: Sarah Boyd MRN: [id number] Date of Birth: [birth year] Referring Provider: Dr. Rexene Alberts  Encounter Date: 05/02/2016      PT End of Session - 05/02/16 1646    Visit Number 3   Number of Visits 17   Date for PT Re-Evaluation 06/10/16   Authorization Type [employer]: G-CODE AND PROGRESS NOTE EVERY 10TH VISIT. Waiting for auth info.   PT Start Time 1445   PT Stop Time 1530   PT Time Calculation (min) 45 min   Activity Tolerance Patient tolerated treatment well   Behavior During Therapy WFL for tasks assessed/performed      Past Medical History:  Diagnosis Date  . Asthma   . History of breast cancer S/P RIGHT MASTECTOMY AND CHEMO 21 YRS AGO (APPROX.  1990)   NO RECURRENCE  . HOH (hard of hearing) NO AIDS  . Hypothyroidism   . Labral tear of shoulder RIGHT SHOULDER  . Right shoulder pain     Past Surgical History:  Procedure Laterality Date  . CATARACT EXTRACTION, BILATERAL    . EXCISION OF RIGHT CHEST MASS  12-19-1999   BENIGN  . MASTECTOMY  1990  (APPROX.)   RIGHT BREAST CANCER -- NO RECURRENCE  . ORIF BILATERAL WRIST  2009  . SHOULDER ARTHROSCOPY  12/06/2011   Procedure: ARTHROSCOPY SHOULDER;  Surgeon: Magnus Sinning, MD;  Location: [facility];  Service: Orthopedics;  Laterality: Right;  WITH LABRIAL DEBRIDEMENT AND SAD INTERSCALINE BLOCK    There were no vitals filed for this visit.      Subjective Assessment - 05/02/16 1450    Subjective Patient reports she still has moments of feeling drunk. Her neice states pt is trying to drink more fluids.   Patient is accompained by: Family member   Pertinent History Must have liquids thickened (1 powder scoop for 8oz.) per pt's niece, Hx of R breast CA, asthma, HOH, osteoporsis, OA,  hypothyroidism, Hx of R shoulder labral tear (unsure of the year but states it was a long time ago), Hx of pelvic fx   Patient Stated Goals Walk better   Currently in Pain? No/denies           [facility] PT Assessment - 05/02/16 1651      Dynamic Gait Index   Level Surface Moderate Impairment   Change in Gait Speed Severe Impairment   Gait with Horizontal Head Turns Mild Impairment   Gait with Vertical Head Turns Mild Impairment   Gait and Pivot Turn Moderate Impairment   Step Over Obstacle Severe Impairment   Step Around Obstacles Mild Impairment   Steps Moderate Impairment   Total Score 9   DGI comment: <19= high fall risk     Performed and assigned HEP. Please see patient instructions below for details.       PT Education - 05/02/16 1645    Education provided Yes   Education Details Provided HEP for vestibular and LE strengthening.   Person(s) Educated Patient;Other (comment)   Methods Explanation;Demonstration;Handout   Comprehension Verbalized understanding;Returned demonstration;Need further instruction          PT Short Term Goals - 04/12/16 1142      PT SHORT TERM GOAL #1   Title Pt will be IND in HEP to improve balance, reduce dizziness, and improve strength. TARGET DATE FOR ALL  STGS: 05/09/16   Status New     PT SHORT TERM GOAL #2   Title Pt will improve gait speed with LRAD to >/=2.49ft/sec to improve functional mobility.    Status New     PT SHORT TERM GOAL #3   Title Perform DGI and write goal.    Status New     PT SHORT TERM GOAL #4   Title Pt will amb. 300' over even terrain, while performing head turns, at MOD I level with LRAD to improve functional mobility.    Status New           PT Long Term Goals - 04/12/16 1145      PT LONG TERM GOAL #1   Title Pt will verbalize understanding of fall prevention strategies to decr. falls risk. TARGET DATE FOR ALL LTGS: 06/06/16   Status New     PT LONG TERM GOAL #2   Title Pt will amb. 600' over  even and paved surfaces with LRAD, at MOD I level in order to improve functional mobility and attend church.    Status New     PT LONG TERM GOAL #3   Title Pt will amb. >/=2.31ft/sec. with LRAD to amb. safely in the community.    Status New     PT LONG TERM GOAL #4   Title Pt will improve DHI score from 50% to 32% to improve quality of life and to reduce impact of dizziness on functional abilities.    Status New           Plan - 05/02/16 1648    Clinical Impression Statement Todays skilled session focused on providing the patient with an HEP for habituation and LE strengthening. Along with performing the DGI to set STGs.   Rehab Potential Good   Clinical Impairments Affecting Rehab Potential co-morbidities and pt only able to come 1x/week due to transportation and distance   PT Frequency 2x / week   PT Duration 8 weeks   PT Treatment/Interventions ADLs/Self Care Home Management;Biofeedback;Canalith Repostioning;Neuromuscular re-education;Balance training;Therapeutic exercise;Therapeutic activities;Manual techniques;Functional mobility training;Stair training;Gait training;DME Instruction;Patient/family education;Vestibular   PT Next Visit Plan Rollator safety   Consulted and Agree with Plan of Care Patient;Family member/caregiver   Family Member Consulted niece: [name]      Patient will benefit from skilled therapeutic intervention in order to improve the following deficits and impairments:  Abnormal gait, Decreased endurance, Decreased strength, Decreased balance, Decreased mobility, Dizziness, Decreased range of motion, Impaired flexibility, Postural dysfunction, Impaired UE functional use  Visit Diagnosis: Dizziness and giddiness  Other abnormalities of gait and mobility  Muscle weakness (generalized)  Repeated falls     Problem List Patient Active Problem List   Diagnosis Date Noted  . Frequent falls 04/15/2015  . Asthma, chronic 12/05/2012  . Osteoporosis  12/05/2012  . Hypothyroidism 12/05/2012   Benjiman Core, SPTA 05/03/2016, 3:37 PM  [facility] [address] [place], [place], [address] Phone: [phone]   Fax:  [phone]  Name: Sarah Boyd MRN: [id number] Date of Birth: May 17, 1930

## 2016-05-08 ENCOUNTER — Ambulatory Visit (INDEPENDENT_AMBULATORY_CARE_PROVIDER_SITE_OTHER): Payer: Commercial Managed Care - HMO | Admitting: Nurse Practitioner

## 2016-05-08 ENCOUNTER — Encounter: Payer: Self-pay | Admitting: Nurse Practitioner

## 2016-05-08 VITALS — BP 118/66 | HR 84 | Temp 97.3°F | Ht 61.0 in | Wt 104.0 lb

## 2016-05-08 DIAGNOSIS — M810 Age-related osteoporosis without current pathological fracture: Secondary | ICD-10-CM

## 2016-05-08 DIAGNOSIS — H9190 Unspecified hearing loss, unspecified ear: Secondary | ICD-10-CM

## 2016-05-08 DIAGNOSIS — R296 Repeated falls: Secondary | ICD-10-CM

## 2016-05-08 DIAGNOSIS — E034 Atrophy of thyroid (acquired): Secondary | ICD-10-CM

## 2016-05-08 DIAGNOSIS — Z23 Encounter for immunization: Secondary | ICD-10-CM | POA: Diagnosis not present

## 2016-05-08 DIAGNOSIS — J4520 Mild intermittent asthma, uncomplicated: Secondary | ICD-10-CM | POA: Diagnosis not present

## 2016-05-08 MED ORDER — LEVOTHYROXINE SODIUM 25 MCG PO TABS: 25.0000 ug | ORAL_TABLET | Freq: Every day | ORAL | 5 refills | Status: DC

## 2016-05-08 MED ORDER — MONTELUKAST SODIUM 10 MG PO TABS: 10.0000 mg | ORAL_TABLET | Freq: Every day | ORAL | 5 refills | Status: DC

## 2016-05-08 MED ORDER — CETIRIZINE HCL 10 MG PO TABS: 10.0000 mg | ORAL_TABLET | Freq: Every day | ORAL | 5 refills | Status: DC

## 2016-05-08 NOTE — Patient Instructions (Signed)
Fall Prevention in the Home Introduction Falls can cause injuries. They can happen to people of all ages. There are many things you can do to make your home safe and to help prevent falls. What can I do on the outside of my home?  Regularly fix the edges of walkways and driveways and fix any cracks.  Remove anything that might make you trip as you walk through a door, such as a raised step or threshold.  Trim any bushes or trees on the path to your home.  Use bright outdoor lighting.  Clear any walking paths of anything that might make someone trip, such as rocks or tools.  Regularly check to see if handrails are loose or broken. Make sure that both sides of any steps have handrails.  Any raised decks and porches should have guardrails on the edges.  Have any leaves, snow, or ice cleared regularly.  Use sand or salt on walking paths during winter.  Clean up any spills in your garage right away. This includes oil or grease spills. What can I do in the bathroom?  Use night lights.  Install grab bars by the toilet and in the tub and shower. Do not use towel bars as grab bars.  Use non-skid mats or decals in the tub or shower.  If you need to sit down in the shower, use a plastic, non-slip stool.  Keep the floor dry. Clean up any water that spills on the floor as soon as it happens.  Remove soap buildup in the tub or shower regularly.  Attach bath mats securely with double-sided non-slip rug tape.  Do not have throw rugs and other things on the floor that can make you trip. What can I do in the bedroom?  Use night lights.  Make sure that you have a light by your bed that is easy to reach.  Do not use any sheets or blankets that are too big for your bed. They should not hang down onto the floor.  Have a firm chair that has side arms. You can use this for support while you get dressed.  Do not have throw rugs and other things on the floor that can make you trip. What can  I do in the kitchen?  Clean up any spills right away.  Avoid walking on wet floors.  Keep items that you use a lot in easy-to-reach places.  If you need to reach something above you, use a strong step stool that has a grab bar.  Keep electrical cords out of the way.  Do not use floor polish or wax that makes floors slippery. If you must use wax, use non-skid floor wax.  Do not have throw rugs and other things on the floor that can make you trip. What can I do with my stairs?  Do not leave any items on the stairs.  Make sure that there are handrails on both sides of the stairs and use them. Fix handrails that are broken or loose. Make sure that handrails are as long as the stairways.  Check any carpeting to make sure that it is firmly attached to the stairs. Fix any carpet that is loose or worn.  Avoid having throw rugs at the top or bottom of the stairs. If you do have throw rugs, attach them to the floor with carpet tape.  Make sure that you have a light switch at the top of the stairs and the bottom of the stairs. If you   do not have them, ask someone to add them for you. What else can I do to help prevent falls?  Wear shoes that:  Do not have high heels.  Have rubber bottoms.  Are comfortable and fit you well.  Are closed at the toe. Do not wear sandals.  If you use a stepladder:  Make sure that it is fully opened. Do not climb a closed stepladder.  Make sure that both sides of the stepladder are locked into place.  Ask someone to hold it for you, if possible.  Clearly mark and make sure that you can see:  Any grab bars or handrails.  First and last steps.  Where the edge of each step is.  Use tools that help you move around (mobility aids) if they are needed. These include:  Canes.  Walkers.  Scooters.  Crutches.  Turn on the lights when you go into a dark area. Replace any light bulbs as soon as they burn out.  Set up your furniture so you have a  clear path. Avoid moving your furniture around.  If any of your floors are uneven, fix them.  If there are any pets around you, be aware of where they are.  Review your medicines with your doctor. Some medicines can make you feel dizzy. This can increase your chance of falling. Ask your doctor what other things that you can do to help prevent falls. This information is not intended to replace advice given to you by your health care provider. Make sure you discuss any questions you have with your health care provider. Document Released: 03/25/2009 Document Revised: 11/04/2015 Document Reviewed: 07/03/2014  2017 Elsevier  

## 2016-05-08 NOTE — Progress Notes (Signed)
 Subjective:    Patient ID: Sarah Boyd, female    DOB: 10/13/1929, 82 y.o.   MRN: [id number]  HPI  Patient here today for follow up of chronic medical problems. No changes since last visit. No complaints today.  Outpatient Encounter Prescriptions as of 05/08/2016  Medication Sig  . albuterol (PROVENTIL) (2.5 MG/3ML) 0.083% nebulizer solution Take 3 mLs (2.5 mg total) by nebulization every 6 (six) hours as needed for wheezing or shortness of breath.  . alendronate (FOSAMAX) 70 MG tablet Take 70 mg by mouth every Sunday. Take with a full glass of water on an empty stomach.  . Calcium Carbonate-Vitamin D (CALCIUM 600+D) 600-400 MG-UNIT tablet Take 1 tablet by mouth daily.  . cetirizine (ZYRTEC) 10 MG tablet Take 10 mg by mouth at bedtime.  . Fluticasone-Salmeterol (ADVAIR) 100-50 MCG/DOSE AEPB Inhale 1 puff into the lungs 2 (two) times daily.  . levothyroxine (SYNTHROID, LEVOTHROID) 25 MCG tablet Take 25 mcg by mouth daily before breakfast.  . montelukast (SINGULAIR) 10 MG tablet Take 10 mg by mouth at bedtime.  . Multiple Vitamin (MULTIVITAMIN WITH MINERALS) TABS tablet Take 1 tablet by mouth daily.   No facility-administered encounter medications on file as of 05/08/2016.     Hypothyroidism Currently on synthroid 25mcg daily- no complaints Asthma Takes singulair and advair daily has not need albuterol in quite sometime. Osteoporosis Fosamax weekly- has some back pain that she takes ultram for. Frequent falls Patient had some falls prior to last visit and has had 2 more since I saw her last. She says she just looses her balance. SH e has had no major injuries.  Review of Systems  Constitutional: Negative.   HENT: Negative.   Respiratory: Negative.   Genitourinary: Negative.   Neurological: Negative.   Psychiatric/Behavioral: Negative.   All other systems reviewed and are negative.      Objective:   Physical Exam  Constitutional: She is oriented to person, place, and  time. She appears well-developed and well-nourished.  HENT:  Nose: Nose normal.  Mouth/Throat: Oropharynx is clear and moist.  Eyes: EOM are normal.  Neck: Trachea normal, normal range of motion and full passive range of motion without pain. Neck supple. No JVD present. Carotid bruit is not present. No thyromegaly present.  Cardiovascular: Normal rate, regular rhythm, normal heart sounds and intact distal pulses.  Exam reveals no gallop and no friction rub.   No murmur heard. Pulmonary/Chest: Effort normal and breath sounds normal.  Abdominal: Soft. Bowel sounds are normal. She exhibits no distension and no mass. There is no tenderness.  Musculoskeletal: Normal range of motion.  Patient walking with walker- steady and slow  Lymphadenopathy:    She has no cervical adenopathy.  Neurological: She is alert and oriented to person, place, and time. She has normal reflexes. No cranial nerve deficit.  Skin: Skin is warm and dry.  Psychiatric: She has a normal mood and affect. Her behavior is normal. Judgment and thought content normal.    BP 118/66   Pulse 84   Temp 97.3 F (36.3 C) (Oral)   Ht 5' 1" (1.549 m)   Wt 104 lb (47.2 kg)   BMI 19.65 kg/m       Assessment & Plan:  1. Mild intermittent chronic asthma without complication Patient has not been using advair and has had no problems so we will continue to hold advair for now - montelukast (SINGULAIR) 10 MG tablet; Take 1 tablet (10 mg total) by mouth at bedtime.    Dispense: 30 tablet; Refill: 5 - cetirizine (ZYRTEC) 10 MG tablet; Take 1 tablet (10 mg total) by mouth at bedtime.  Dispense: 30 tablet; Refill: 5  2. Hypothyroidism due to acquired atrophy of thyroid - CMP14+EGFR - Lipid panel - Thyroid Panel With TSH - levothyroxine (SYNTHROID, LEVOTHROID) 25 MCG tablet; Take 1 tablet (25 mcg total) by mouth daily before breakfast.  Dispense: 30 tablet; Refill: 5  3. Age-related osteoporosis without current pathological  fracture  4. Frequent falls Walk with walker at ALL TIMES  5. Hearing loss, unspecified hearing loss type, unspecified laterality - Ambulatory referral to Audiology    Labs pending Health maintenance reviewed Diet and exercise encouraged Continue all meds Follow up  [place], FNP

## 2016-05-09 ENCOUNTER — Ambulatory Visit: Payer: Commercial Managed Care - HMO

## 2016-05-09 DIAGNOSIS — M6281 Muscle weakness (generalized): Secondary | ICD-10-CM | POA: Diagnosis not present

## 2016-05-09 DIAGNOSIS — R2689 Other abnormalities of gait and mobility: Secondary | ICD-10-CM | POA: Diagnosis not present

## 2016-05-09 DIAGNOSIS — R296 Repeated falls: Secondary | ICD-10-CM | POA: Diagnosis not present

## 2016-05-09 DIAGNOSIS — R42 Dizziness and giddiness: Secondary | ICD-10-CM | POA: Diagnosis not present

## 2016-05-09 LAB — CMP14+EGFR
A/G RATIO: 1.4 (ref 1.2–2.2)
ALK PHOS: 72 IU/L (ref 39–117)
ALT: 16 IU/L (ref 0–32)
AST: 25 IU/L (ref 0–40)
Albumin: 4 g/dL (ref 3.5–4.7)
BILIRUBIN TOTAL: 0.2 mg/dL (ref 0.0–1.2)
BUN/Creatinine Ratio: 20 (ref 12–28)
BUN: 13 mg/dL (ref 8–27)
CHLORIDE: 101 mmol/L (ref 96–106)
CO2: 26 mmol/L (ref 18–29)
Calcium: 9.2 mg/dL (ref 8.7–10.3)
Creatinine, Ser: 0.66 mg/dL (ref 0.57–1.00)
GFR calc non Af Amer: 81 mL/min/{1.73_m2} (ref >59)
GFR, EST AFRICAN AMERICAN: 94 mL/min/{1.73_m2} (ref >59)
GLUCOSE: 100 mg/dL — AB (ref 65–99)
Globulin, Total: 2.9 g/dL (ref 1.5–4.5)
POTASSIUM: 4.3 mmol/L (ref 3.5–5.2)
Sodium: 142 mmol/L (ref 134–144)
Total Protein: 6.9 g/dL (ref 6.0–8.5)

## 2016-05-09 LAB — LIPID PANEL
CHOLESTEROL TOTAL: 196 mg/dL (ref 100–199)
Chol/HDL Ratio: 2.1 ratio units (ref 0.0–4.4)
HDL: 95 mg/dL (ref >39)
LDL Calculated: 41 mg/dL (ref 0–99)
TRIGLYCERIDES: 299 mg/dL — AB (ref 0–149)
VLDL CHOLESTEROL CAL: 60 mg/dL — AB (ref 5–40)

## 2016-05-09 LAB — THYROID PANEL WITH TSH
FREE THYROXINE INDEX: 1.7 (ref 1.2–4.9)
T3 Uptake Ratio: 25 % (ref 24–39)
T4, Total: 6.6 ug/dL (ref 4.5–12.0)
TSH: 1.75 u[IU]/mL (ref 0.450–4.500)

## 2016-05-09 NOTE — Therapy (Signed)
[facility] [address] [place], [place], [address] Phone: [phone]   Fax:  [phone]  Physical Therapy Treatment  Patient Details  Name: Sarah Boyd MRN: [id number] Date of Birth: [birth year] Referring Provider: Dr. Rexene Alberts  Encounter Date: 05/09/2016      PT End of Session - 05/09/16 1304    Visit Number 4   Number of Visits 17   Date for PT Re-Evaluation 06/10/16   Authorization Type [employer] Medicare: G-CODE AND PROGRESS NOTE EVERY 10TH VISIT. Waiting for auth info.   PT Start Time 1104   PT Stop Time 1143   PT Time Calculation (min) 39 min   Equipment Utilized During Treatment Gait belt   Activity Tolerance Patient tolerated treatment well   Behavior During Therapy WFL for tasks assessed/performed      Past Medical History:  Diagnosis Date  . Asthma   . History of breast cancer S/P RIGHT MASTECTOMY AND CHEMO 21 YRS AGO (APPROX.  1990)   NO RECURRENCE  . HOH (hard of hearing) NO AIDS  . Hypothyroidism   . Labral tear of shoulder RIGHT SHOULDER  . Right shoulder pain     Past Surgical History:  Procedure Laterality Date  . CATARACT EXTRACTION, BILATERAL    . EXCISION OF RIGHT CHEST MASS  12-19-1999   BENIGN  . MASTECTOMY  1990  (APPROX.)   RIGHT BREAST CANCER -- NO RECURRENCE  . ORIF BILATERAL WRIST  2009  . SHOULDER ARTHROSCOPY  12/06/2011   Procedure: ARTHROSCOPY SHOULDER;  Surgeon: Magnus Sinning, MD;  Location: [facility];  Service: Orthopedics;  Laterality: Right;  WITH LABRIAL DEBRIDEMENT AND SAD INTERSCALINE BLOCK    There were no vitals filed for this visit.      Subjective Assessment - 05/09/16 1107    Subjective Pt denied falls or changes since last visit.    Patient is accompained by: Family member   Pertinent History Must have liquids thickened (1 powder scoop for 8oz.) per pt's niece, Hx of R breast CA, asthma, HOH, osteoporsis, OA, hypothyroidism, Hx of R  shoulder labral tear (unsure of the year but states it was a long time ago), Hx of pelvic fx   Patient Stated Goals Walk better   Currently in Pain? No/denies                         [facility] Adult PT Treatment/Exercise - 05/09/16 1109      Ambulation/Gait   Ambulation/Gait Yes   Ambulation/Gait Assistance 6: Modified independent (Device/Increase time);5: Supervision   Ambulation/Gait Assistance Details Pt MOD I over even terrain for 345' but did require cues to avoid obstacles (stool over carpet). PT adjusted pt's rollator height to promote upright posture.     Ambulation Distance (Feet) 345 Feet  75' and 200'   Assistive device Rollator   Gait Pattern Step-through pattern;Decreased stride length;Trunk flexed   Ambulation Surface Level;Indoor   Gait velocity 2.75f/sec.  with rollator     Standardized Balance Assessment   Standardized Balance Assessment Berg Balance Test     Berg Balance Test   Sit to Stand Able to stand  independently using hands  pt was able to stand without hands after 2 attempts   Standing Unsupported Able to stand 2 minutes with supervision   Sitting with Back Unsupported but Feet Supported on Floor or Stool Able to sit safely and securely 2 minutes   Stand to Sit Sits  safely with minimal use of hands   Transfers Able to transfer safely, definite need of hands   Standing Unsupported with Eyes Closed Able to stand 10 seconds with supervision   Standing Ubsupported with Feet Together Able to place feet together independently and stand for 1 minute with supervision   From Standing, Reach Forward with Outstretched Arm Can reach forward >12 cm safely (5")   From Standing Position, Pick up Object from [place] to pick up shoe, needs supervision   From Standing Position, Turn to Look Behind Over each Shoulder Looks behind one side only/other side shows less weight shift   Turn 360 Degrees Needs assistance while turning   Standing Unsupported,  Alternately Place Feet on Step/Stool Able to complete >2 steps/needs minimal assist   Standing Unsupported, One Foot in Front Able to take small step independently and hold 30 seconds   Standing on One Leg Tries to lift leg/unable to hold 3 seconds but remains standing independently   Total Score 36                PT Education - 05/09/16 1302    Education provided Yes   Education Details PT discussed goal progress and outcome measure results. PT educated pt on the importance of continuing HEP. PT adjusted height of rollator to encourage upright posture and educated pt on why upright posture is important.    Person(s) Educated Patient;Other (comment)  niece: [name]   Methods Explanation   Comprehension Verbalized understanding          PT Short Term Goals - 05/09/16 1307      PT SHORT TERM GOAL #1   Title Pt will be IND in HEP to improve balance, reduce dizziness, and improve strength. TARGET DATE FOR ALL STGS: 05/09/16   Status On-going     PT SHORT TERM GOAL #2   Title Pt will improve gait speed with LRAD to >/=2.25f/sec to improve functional mobility.    Status Achieved     PT SHORT TERM GOAL #3   Title Perform DGI and write goal.    Baseline Revised to write BERG goal vs. DGI 2/2 falls risk and DGI and BERG scores   Status Revised     PT SHORT TERM GOAL #4   Title Pt will amb. 300' over even terrain, while performing head turns, at MOD I level with LRAD to improve functional mobility.    Status Achieved           PT Long Term Goals - 05/09/16 1307      PT LONG TERM GOAL #1   Title Pt will verbalize understanding of fall prevention strategies to decr. falls risk. TARGET DATE FOR ALL LTGS: 06/06/16   Status On-going     PT LONG TERM GOAL #2   Title Pt will amb. 600' over even and paved surfaces with LRAD, at MOD I level in order to improve functional mobility and attend church.    Status On-going     PT LONG TERM GOAL #3   Title Pt will amb.  >/=2.619fsec. with LRAD to amb. safely in the community.    Status Achieved     PT LONG TERM GOAL #4   Title Pt will improve DHI score from 50% to 32% to improve quality of life and to reduce impact of dizziness on functional abilities.    Status On-going     PT LONG TERM GOAL #5   Title Pt will improve BERG score to >/=41/56  to decr. falls risk.    Status New               Plan - 05/09/16 1304    Clinical Impression Statement Pt demonstrated progress, as she met STGs 2 and 4. Pt also met LTG 3. PT will assess STG 1 next session. PT will defer writing DGI score and will write a BERG goal instead, due to pt's impaired balance. Pt's BERG score indicates pt is at risk for falls. Continue with POC.   Rehab Potential Good   Clinical Impairments Affecting Rehab Potential co-morbidities and pt only able to come 1x/week due to transportation and distance   PT Frequency 2x / week   PT Duration 8 weeks   PT Treatment/Interventions ADLs/Self Care Home Management;Biofeedback;Canalith Repostioning;Neuromuscular re-education;Balance training;Therapeutic exercise;Therapeutic activities;Manual techniques;Functional mobility training;Stair training;Gait training;DME Instruction;Patient/family education;Vestibular   PT Next Visit Plan Assess HEP and modify prn. Gait training around obstacles.    Consulted and Agree with Plan of Care Patient;Family member/caregiver   Family Member Consulted niece: [name]      Patient will benefit from skilled therapeutic intervention in order to improve the following deficits and impairments:  Abnormal gait, Decreased endurance, Decreased strength, Decreased balance, Decreased mobility, Dizziness, Decreased range of motion, Impaired flexibility, Postural dysfunction, Impaired UE functional use  Visit Diagnosis: Other abnormalities of gait and mobility  Muscle weakness (generalized)     Problem List Patient Active Problem List   Diagnosis Date Noted  .  Frequent falls 04/15/2015  . Asthma, chronic 12/05/2012  . Osteoporosis 12/05/2012  . Hypothyroidism 12/05/2012    Ceaira Ernster L 05/09/2016, 1:09 PM  [facility] [address] [place], [place], [address] Phone: [phone]   Fax:  [phone]  Name: Sarah Boyd MRN: [id number] Date of Birth: 07/20/1929  Geoffry Paradise, PT,DPT 05/09/16 1:10 PM Phone: [phone] Fax: [phone]

## 2016-05-16 ENCOUNTER — Ambulatory Visit: Payer: Commercial Managed Care - HMO | Attending: Neurology | Admitting: Physical Therapy

## 2016-05-16 ENCOUNTER — Encounter: Payer: Self-pay | Admitting: Physical Therapy

## 2016-05-16 DIAGNOSIS — R42 Dizziness and giddiness: Secondary | ICD-10-CM

## 2016-05-16 DIAGNOSIS — M6281 Muscle weakness (generalized): Secondary | ICD-10-CM | POA: Diagnosis not present

## 2016-05-16 DIAGNOSIS — R2689 Other abnormalities of gait and mobility: Secondary | ICD-10-CM | POA: Diagnosis not present

## 2016-05-16 DIAGNOSIS — R296 Repeated falls: Secondary | ICD-10-CM | POA: Insufficient documentation

## 2016-05-16 NOTE — Therapy (Signed)
[facility] [address] [place], [place], [address] Phone: [phone]   Fax:  [phone]  Physical Therapy Treatment  Patient Details  Name: Sarah Boyd MRN: [id number] Date of Birth: 06/23/1929 Referring Provider: Dr. Rexene Alberts  Encounter Date: 05/16/2016      PT End of Session - 05/16/16 1232    Visit Number 5   Number of Visits 17   Date for PT Re-Evaluation 06/10/16   Authorization Type [employer] Medicare: G-CODE AND PROGRESS NOTE EVERY 10TH VISIT. Waiting for auth info.   PT Start Time 0930   PT Stop Time 1012   PT Time Calculation (min) 42 min   Equipment Utilized During Treatment Gait belt   Activity Tolerance Patient tolerated treatment well   Behavior During Therapy WFL for tasks assessed/performed      Past Medical History:  Diagnosis Date  . Asthma   . History of breast cancer S/P RIGHT MASTECTOMY AND CHEMO 21 YRS AGO (APPROX.  1990)   NO RECURRENCE  . HOH (hard of hearing) NO AIDS  . Hypothyroidism   . Labral tear of shoulder RIGHT SHOULDER  . Right shoulder pain     Past Surgical History:  Procedure Laterality Date  . CATARACT EXTRACTION, BILATERAL    . EXCISION OF RIGHT CHEST MASS  12-19-1999   BENIGN  . MASTECTOMY  1990  (APPROX.)   RIGHT BREAST CANCER -- NO RECURRENCE  . ORIF BILATERAL WRIST  2009  . SHOULDER ARTHROSCOPY  12/06/2011   Procedure: ARTHROSCOPY SHOULDER;  Surgeon: Magnus Sinning, MD;  Location: [facility];  Service: Orthopedics;  Laterality: Right;  WITH LABRIAL DEBRIDEMENT AND SAD INTERSCALINE BLOCK    There were no vitals filed for this visit.      Subjective Assessment - 05/16/16 0948    Subjective Patient reports she fell when getting into the shower over the weekend. She landed on her hips but is feeling fine today.   Patient is accompained by: Family member   Pertinent History Must have liquids thickened (1 powder scoop for 8oz.) per pt's niece,  Hx of R breast CA, asthma, HOH, osteoporsis, OA, hypothyroidism, Hx of R shoulder labral tear (unsure of the year but states it was a long time ago), Hx of pelvic fx   Patient Stated Goals Walk better   Currently in Pain? Yes   Pain Score 3    Pain Location Back   Pain Orientation Left   Pain Onset Today          [facility] Adult PT Treatment/Exercise - 05/16/16 1228      Ambulation/Gait   Ambulation/Gait Yes   Ambulation/Gait Assistance 5: Supervision   Ambulation/Gait Assistance Details cues for safe use of rollator and upright posture   Ambulation Distance (Feet) 185 Feet   Assistive device Rollator   Gait Pattern Step-through pattern;Decreased stride length;Trunk flexed   Ambulation Surface Level;Unlevel;Indoor   Ramp 5: Supervision   Ramp Details (indicate cue type and reason) VC for correct technique and safety with breaks   Curb 4: Min assist   Curb Details (indicate cue type and reason) cues for technique and physical assist to lift walker on and off curb.      Therapeutic activity  Tub transfer; used wooden board across two low stools to simulate tub, placing shower chair behind board; VC for proper technique during tub transfer and rollator safety.  Therapeutic Exercise  Bridges 10 reps; vc for technique   SLR 2x 10  reps each side; verbal and tactile cues for technique.       PT Education - 05/16/16 1231    Education provided Yes   Education Details Educated pt and niece on correct technique for getting into and out of the shower along with the need for safer shower chair. Currently using a stool with no back and elevated seat.   Person(s) Educated Patient;Child(ren)   Methods Explanation;Demonstration;Tactile cues;Verbal cues   Comprehension Verbalized understanding;Returned demonstration;Need further instruction          PT Short Term Goals - 05/16/16 6712      PT SHORT TERM GOAL #1   Title Pt will be IND in HEP to improve balance, reduce dizziness, and  improve strength. TARGET DATE FOR ALL STGS: 05/09/16   Baseline 05/16/2016 Met pt reports she perfrome her HEP once a day in the bed.   Status Achieved     PT SHORT TERM GOAL #2   Title Pt will improve gait speed with LRAD to >/=2.66f/sec to improve functional mobility.    Status Achieved     PT SHORT TERM GOAL #3   Title Perform DGI and write goal.    Baseline Revised to write BERG goal vs. DGI 2/2 falls risk and DGI and BERG scores   Status Revised     PT SHORT TERM GOAL #4   Title Pt will amb. 300' over even terrain, while performing head turns, at MOD I level with LRAD to improve functional mobility.    Status Achieved           PT Long Term Goals - 05/09/16 1307      PT LONG TERM GOAL #1   Title Pt will verbalize understanding of fall prevention strategies to decr. falls risk. TARGET DATE FOR ALL LTGS: 06/06/16   Status On-going     PT LONG TERM GOAL #2   Title Pt will amb. 600' over even and paved surfaces with LRAD, at MOD I level in order to improve functional mobility and attend church.    Status On-going     PT LONG TERM GOAL #3   Title Pt will amb. >/=2.65fsec. with LRAD to amb. safely in the community.    Status Achieved     PT LONG TERM GOAL #4   Title Pt will improve DHI score from 50% to 32% to improve quality of life and to reduce impact of dizziness on functional abilities.    Status On-going     PT LONG TERM GOAL #5   Title Pt will improve BERG score to >/=41/56 to decr. falls risk.    Status New           Plan - 05/16/16 1233    Clinical Impression Statement Todays skilled session focued on gait training with rollator and rollator safety, along with safety during tub transfers. She is progressing towards goals and should benefit from continued PT to achieve unmet goals.   Rehab Potential Good   Clinical Impairments Affecting Rehab Potential co-morbidities and pt only able to come 1x/week due to transportation and distance   PT Frequency 2x /  week   PT Duration 8 weeks   PT Treatment/Interventions ADLs/Self Care Home Management;Biofeedback;Canalith Repostioning;Neuromuscular re-education;Balance training;Therapeutic exercise;Therapeutic activities;Manual techniques;Functional mobility training;Stair training;Gait training;DME Instruction;Patient/family education;Vestibular   PT Next Visit Plan Assess HEP and modify prn. Gait training around obstacles.    Consulted and Agree with Plan of Care Patient;Family member/caregiver   Family Member Consulted niece: [name]  Patient will benefit from skilled therapeutic intervention in order to improve the following deficits and impairments:  Abnormal gait, Decreased endurance, Decreased strength, Decreased balance, Decreased mobility, Dizziness, Decreased range of motion, Impaired flexibility, Postural dysfunction, Impaired UE functional use  Visit Diagnosis: Other abnormalities of gait and mobility  Muscle weakness (generalized)  Dizziness and giddiness  Repeated falls     Problem List Patient Active Problem List   Diagnosis Date Noted  . Frequent falls 04/15/2015  . Asthma, chronic 12/05/2012  . Osteoporosis 12/05/2012  . Hypothyroidism 12/05/2012    Benjiman Core, SPTA 05/16/2016, 12:37 PM  [facility] [address] [place], [place], [address] Phone: [phone]   Fax:  [phone]  Name: BRAELYNN BENNING MRN: [id number] Date of Birth: 1929-08-25

## 2016-05-23 ENCOUNTER — Ambulatory Visit: Payer: Commercial Managed Care - HMO

## 2016-05-23 DIAGNOSIS — M6281 Muscle weakness (generalized): Secondary | ICD-10-CM | POA: Diagnosis not present

## 2016-05-23 DIAGNOSIS — R296 Repeated falls: Secondary | ICD-10-CM | POA: Diagnosis not present

## 2016-05-23 DIAGNOSIS — R2689 Other abnormalities of gait and mobility: Secondary | ICD-10-CM | POA: Diagnosis not present

## 2016-05-23 DIAGNOSIS — R42 Dizziness and giddiness: Secondary | ICD-10-CM | POA: Diagnosis not present

## 2016-05-23 NOTE — Therapy (Signed)
[facility] [address] [place], [place], [address] Phone: [phone]   Fax:  [phone]  Physical Therapy Treatment  Patient Details  Name: Sarah Boyd MRN: [id number] Date of Birth: [birth year] Referring Provider: Dr. Rexene Alberts  Encounter Date: 05/23/2016      PT End of Session - 05/23/16 1233    Visit Number 6   Number of Visits 17   Date for PT Re-Evaluation 06/10/16   Authorization Type [employer] Medicare: G-CODE AND PROGRESS NOTE EVERY 10TH VISIT. Waiting for auth info.   PT Start Time 1145   PT Stop Time 1228   PT Time Calculation (min) 43 min   Equipment Utilized During Treatment --  min guard to S prn   Activity Tolerance Patient tolerated treatment well   Behavior During Therapy WFL for tasks assessed/performed      Past Medical History:  Diagnosis Date  . Asthma   . History of breast cancer S/P RIGHT MASTECTOMY AND CHEMO 21 YRS AGO (APPROX.  1990)   NO RECURRENCE  . HOH (hard of hearing) NO AIDS  . Hypothyroidism   . Labral tear of shoulder RIGHT SHOULDER  . Right shoulder pain     Past Surgical History:  Procedure Laterality Date  . CATARACT EXTRACTION, BILATERAL    . EXCISION OF RIGHT CHEST MASS  12-19-1999   BENIGN  . MASTECTOMY  1990  (APPROX.)   RIGHT BREAST CANCER -- NO RECURRENCE  . ORIF BILATERAL WRIST  2009  . SHOULDER ARTHROSCOPY  12/06/2011   Procedure: ARTHROSCOPY SHOULDER;  Surgeon: Magnus Sinning, MD;  Location: [facility];  Service: Orthopedics;  Laterality: Right;  WITH LABRIAL DEBRIDEMENT AND SAD INTERSCALINE BLOCK    There were no vitals filed for this visit.      Subjective Assessment - 05/23/16 1149    Subjective Pt denied falls or changes since last visit.    Patient is accompained by: Family member   Pertinent History Must have liquids thickened (1 powder scoop for 8oz.) per pt's niece, Hx of R breast CA, asthma, HOH, osteoporsis, OA,  hypothyroidism, Hx of R shoulder labral tear (unsure of the year but states it was a long time ago), Hx of pelvic fx   Patient Stated Goals Walk better   Currently in Pain? No/denies         Therex and neuro re-ed: Pt performed strengthening and balance HEP with tactile and VC's. Performed with min guard to S for safety. Pt required cues for technique and safety. Please see pt instructions for details.                         PT Education - 05/23/16 1232    Education provided Yes   Education Details PT progressed pt's strengthening HEP and added balance HEP. PT reiterated the importance of performing balance HEP only when someone is home with pt.    Person(s) Educated Patient;Other (comment)  niece: [name]   Methods Explanation;Demonstration;Tactile cues;Verbal cues;Handout   Comprehension Returned demonstration;Verbalized understanding;Need further instruction          PT Short Term Goals - 05/16/16 8588      PT SHORT TERM GOAL #1   Title Pt will be IND in HEP to improve balance, reduce dizziness, and improve strength. TARGET DATE FOR ALL STGS: 05/09/16   Baseline 05/16/2016 Met pt reports she perfrome her HEP once a day in the bed.   Status Achieved  PT SHORT TERM GOAL #2   Title Pt will improve gait speed with LRAD to >/=2.41f/sec to improve functional mobility.    Status Achieved     PT SHORT TERM GOAL #3   Title Perform DGI and write goal.    Baseline Revised to write BERG goal vs. DGI 2/2 falls risk and DGI and BERG scores   Status Revised     PT SHORT TERM GOAL #4   Title Pt will amb. 300' over even terrain, while performing head turns, at MOD I level with LRAD to improve functional mobility.    Status Achieved           PT Long Term Goals - 05/09/16 1307      PT LONG TERM GOAL #1   Title Pt will verbalize understanding of fall prevention strategies to decr. falls risk. TARGET DATE FOR ALL LTGS: 06/06/16   Status On-going     PT  LONG TERM GOAL #2   Title Pt will amb. 600' over even and paved surfaces with LRAD, at MOD I level in order to improve functional mobility and attend church.    Status On-going     PT LONG TERM GOAL #3   Title Pt will amb. >/=2.668fsec. with LRAD to amb. safely in the community.    Status Achieved     PT LONG TERM GOAL #4   Title Pt will improve DHI score from 50% to 32% to improve quality of life and to reduce impact of dizziness on functional abilities.    Status On-going     PT LONG TERM GOAL #5   Title Pt will improve BERG score to >/=41/56 to decr. falls risk.    Status New               Plan - 05/23/16 1234    Clinical Impression Statement Pt demonstrated improved LE strength, as she was able to perform incr. reps of strengthening HEP. Pt continues to experience LOB and incr. postural sway without UE support during balance activities. PT added balance activities to HEP and educated pt on the importance of performing when someone is home to help pt with balance HEP. Continue with POC.   Rehab Potential Good   Clinical Impairments Affecting Rehab Potential co-morbidities and pt only able to come 1x/week due to transportation and distance   PT Frequency 2x / week   PT Duration 8 weeks   PT Treatment/Interventions ADLs/Self Care Home Management;Biofeedback;Canalith Repostioning;Neuromuscular re-education;Balance training;Therapeutic exercise;Therapeutic activities;Manual techniques;Functional mobility training;Stair training;Gait training;DME Instruction;Patient/family education;Vestibular   PT Next Visit Plan Gait training around obstacles and balance activities.    Consulted and Agree with Plan of Care Patient;Family member/caregiver   Family Member Consulted niece: [name]      Patient will benefit from skilled therapeutic intervention in order to improve the following deficits and impairments:  Abnormal gait, Decreased endurance, Decreased strength, Decreased balance,  Decreased mobility, Dizziness, Decreased range of motion, Impaired flexibility, Postural dysfunction, Impaired UE functional use  Visit Diagnosis: Other abnormalities of gait and mobility  Muscle weakness (generalized)     Problem List Patient Active Problem List   Diagnosis Date Noted  . Frequent falls 04/15/2015  . Asthma, chronic 12/05/2012  . Osteoporosis 12/05/2012  . Hypothyroidism 12/05/2012    Preston Weill L 05/23/2016, 12:36 PM  [id number]: [phone] Fax:  [phone]: MaHINATA DIENERRN: [id number] of Birth: 02/1930/07/19  Geoffry Paradise, PT,DPT 05/23/16 12:37 PM Phone: [phone] Fax: [phone]

## 2016-05-23 NOTE — Patient Instructions (Addendum)
Bridge    Lie back, legs bent. Inhale, pressing hips up. Keeping ribs in, lengthen lower back. Exhale, rolling down along spine from top. Repeat _10__ times. Perform 3 sets of 10 reps. Do __3__ sessions per week.  http://pm.exer.us/55   Copyright  VHI. All rights reserved.  Abduction: Clam - Side-Lying    Lie on side with knees bent. Lift top knee, keeping feet together. Keep trunk steady. Slowly lower leg. _10__ reps per set, _2__ sets per session. Perform 3 days a week.  http://ecce.exer.us/65   Copyright  VHI. All rights reserved.   Hip Flexion / Knee Extension: Straight-Leg Raise (Eccentric)    Lie on back. Lift leg with knee straight. Slowly lower leg for 3-5 seconds. _10_ reps per set, _2__ sets per session. Perform 3 days a week. Copyright  VHI. All rights reserved.   Functional Quadriceps: Sit to Stand    Place locked rollator in front of you for safety. Sit on edge of chair, feet flat on floor. Stand upright, extending knees fully. Start by using both hands to help you stand up and then to sit back down. Progress by using only one hand to stand and sit, then no hands (as long as your safe). Repeat __10__ times per set. Do __1__ sets per session. Do __1__ sessions per day.  http://orth.exer.us/735   Copyright  VHI. All rights reserved.   **Perform at kitchen sink, with chair behind you for safety:  Feet Together, Varied Arm Positions - Eyes Open    Hold sink. With eyes open, feet together, progress to arms at your side, look straight ahead at a stationary object. Hold __30__ seconds. Repeat __2__ times per session. Do _1___ sessions per day.  Copyright  VHI. All rights reserved.  Single Leg - Eyes Open    Holding support (hold sink), lift right leg while maintaining balance over other leg. Progress to removing hands from support surface for longer periods of time. Hold__10__ seconds. Repeat with left leg lifted and hold for 10 seconds. Repeat  __2__ times per session. Do __1__ sessions per day.  Copyright  VHI. All rights reserved.

## 2016-05-30 ENCOUNTER — Ambulatory Visit: Payer: Commercial Managed Care - HMO

## 2016-05-30 ENCOUNTER — Encounter: Payer: Self-pay | Admitting: *Deleted

## 2016-05-30 DIAGNOSIS — M6281 Muscle weakness (generalized): Secondary | ICD-10-CM

## 2016-05-30 DIAGNOSIS — R2689 Other abnormalities of gait and mobility: Secondary | ICD-10-CM | POA: Diagnosis not present

## 2016-05-30 DIAGNOSIS — R296 Repeated falls: Secondary | ICD-10-CM | POA: Diagnosis not present

## 2016-05-30 DIAGNOSIS — R42 Dizziness and giddiness: Secondary | ICD-10-CM | POA: Diagnosis not present

## 2016-05-30 NOTE — Therapy (Signed)
[facility] [address] [place], [place], [address] Phone: [phone]   Fax:  [phone]  Physical Therapy Treatment  Patient Details  Name: Sarah Boyd MRN: [id number] Date of Birth: 01/01/1930 Referring Provider: Dr. Rexene Alberts  Encounter Date: 05/30/2016      PT End of Session - 05/30/16 1329    Visit Number 7   Number of Visits 17   Date for PT Re-Evaluation 06/10/16   Authorization Type [employer]: G-CODE AND PROGRESS NOTE EVERY 10TH VISIT. Waiting for auth info.   PT Start Time 1146   PT Stop Time 1228   PT Time Calculation (min) 42 min   Equipment Utilized During Treatment Gait belt   Activity Tolerance Patient tolerated treatment well   Behavior During Therapy WFL for tasks assessed/performed      Past Medical History:  Diagnosis Date  . Asthma   . History of breast cancer S/P RIGHT MASTECTOMY AND CHEMO 21 YRS AGO (APPROX.  1990)   NO RECURRENCE  . HOH (hard of hearing) NO AIDS  . Hypothyroidism   . Labral tear of shoulder RIGHT SHOULDER  . Right shoulder pain     Past Surgical History:  Procedure Laterality Date  . CATARACT EXTRACTION, BILATERAL    . EXCISION OF RIGHT CHEST MASS  12-19-1999   BENIGN  . MASTECTOMY  1990  (APPROX.)   RIGHT BREAST CANCER -- NO RECURRENCE  . ORIF BILATERAL WRIST  2009  . SHOULDER ARTHROSCOPY  12/06/2011   Procedure: ARTHROSCOPY SHOULDER;  Surgeon: Magnus Sinning, MD;  Location: [facility];  Service: Orthopedics;  Laterality: Right;  WITH LABRIAL DEBRIDEMENT AND SAD INTERSCALINE BLOCK    There were no vitals filed for this visit.      Subjective Assessment - 05/30/16 1148    Subjective Pt denied falls or changes since last visit.    Patient is accompained by: Family member  [name]-niece   Pertinent History Must have liquids thickened (1 powder scoop for 8oz.) per pt's niece, Hx of R breast CA, asthma, HOH, osteoporsis, OA,  hypothyroidism, Hx of R shoulder labral tear (unsure of the year but states it was a long time ago), Hx of pelvic fx   Patient Stated Goals Walk better   Currently in Pain? No/denies                         [facility] Adult PT Treatment/Exercise - 05/30/16 1152      Ambulation/Gait   Ambulation/Gait Yes   Ambulation/Gait Assistance 5: Supervision   Ambulation/Gait Assistance Details Cues to avoid obstacles and to stay within rollator while amb. around obstacles. Pt amb. around stools and cones over even surfaces. Pt amb. over even/uneven terrain in/outdoors.   Ambulation Distance (Feet) 115 Feet  x3, 75'x4, 100'x2 all indoors, 400' outdoors   Assistive device Rollator   Gait Pattern Step-through pattern;Decreased stride length   Ambulation Surface Level;Unlevel;Indoor;Outdoor;Paved   Ramp 5: Supervision;Other (comment)  min guard   Ramp Details (indicate cue type and reason) VC's for technique and correct posture.     Pt also traversed stairs in sidestepping manner with B UE support 2x4steps. With min guard to S for safety and demo/cues for technique.            PT Education - 05/30/16 1328    Education provided Yes   Education Details PT reiterated the importance of using rollator at all time and to trial balance  HEP at home. PT educated pt that goals will be assessed next visit and likely d/c next session.    Person(s) Educated Patient;Other (comment)  [name]-niece   Methods Explanation   Comprehension Verbalized understanding          PT Short Term Goals - 05/16/16 5364      PT SHORT TERM GOAL #1   Title Pt will be IND in HEP to improve balance, reduce dizziness, and improve strength. TARGET DATE FOR ALL STGS: 05/09/16   Baseline 05/16/2016 Met pt reports she perfrome her HEP once a day in the bed.   Status Achieved     PT SHORT TERM GOAL #2   Title Pt will improve gait speed with LRAD to >/=2.49f/sec to improve functional mobility.    Status Achieved      PT SHORT TERM GOAL #3   Title Perform DGI and write goal.    Baseline Revised to write BERG goal vs. DGI 2/2 falls risk and DGI and BERG scores   Status Revised     PT SHORT TERM GOAL #4   Title Pt will amb. 300' over even terrain, while performing head turns, at MOD I level with LRAD to improve functional mobility.    Status Achieved           PT Long Term Goals - 05/09/16 1307      PT LONG TERM GOAL #1   Title Pt will verbalize understanding of fall prevention strategies to decr. falls risk. TARGET DATE FOR ALL LTGS: 06/06/16   Status On-going     PT LONG TERM GOAL #2   Title Pt will amb. 600' over even and paved surfaces with LRAD, at MOD I level in order to improve functional mobility and attend church.    Status On-going     PT LONG TERM GOAL #3   Title Pt will amb. >/=2.664fsec. with LRAD to amb. safely in the community.    Status Achieved     PT LONG TERM GOAL #4   Title Pt will improve DHI score from 50% to 32% to improve quality of life and to reduce impact of dizziness on functional abilities.    Status On-going     PT LONG TERM GOAL #5   Title Pt will improve BERG score to >/=41/56 to decr. falls risk.    Status New               Plan - 05/30/16 1329    Clinical Impression Statement Pt demonstrated progress, as she was able to amb. longer distances over even terrain and was able to avoid obstacles with cues. Pt progressed to S while traversing ramps and demo'd correct weight shifting/posture with cues. Continue with POC.    Rehab Potential Good   Clinical Impairments Affecting Rehab Potential co-morbidities and pt only able to come 1x/week due to transportation and distance   PT Frequency 2x / week   PT Duration 8 weeks   PT Treatment/Interventions ADLs/Self Care Home Management;Biofeedback;Canalith Repostioning;Neuromuscular re-education;Balance training;Therapeutic exercise;Therapeutic activities;Manual techniques;Functional mobility  training;Stair training;Gait training;DME Instruction;Patient/family education;Vestibular   PT Next Visit Plan Check goal and likely d/c. G-codes.   Consulted and Agree with Plan of Care Patient;Family member/caregiver   Family Member Consulted niece: [name]      Patient will benefit from skilled therapeutic intervention in order to improve the following deficits and impairments:  Abnormal gait, Decreased endurance, Decreased strength, Decreased balance, Decreased mobility, Dizziness, Decreased range of motion, Impaired flexibility, Postural dysfunction, Impaired  UE functional use  Visit Diagnosis: Other abnormalities of gait and mobility  Muscle weakness (generalized)     Problem List Patient Active Problem List   Diagnosis Date Noted  . Frequent falls 04/15/2015  . Asthma, chronic 12/05/2012  . Osteoporosis 12/05/2012  . Hypothyroidism 12/05/2012    , L 05/30/2016, 1:52 PM  [facility] [address] [place], [place], [address] Phone: [phone]   Fax:  [phone]  Name: KILEA MCCAREY MRN: [id number] Date of Birth: Nov 23, 1929  Geoffry Paradise, PT,DPT 05/30/16 1:53 PM Phone: [phone] Fax: [phone]

## 2016-06-06 ENCOUNTER — Ambulatory Visit: Payer: Commercial Managed Care - HMO

## 2016-06-06 DIAGNOSIS — R2689 Other abnormalities of gait and mobility: Secondary | ICD-10-CM | POA: Diagnosis not present

## 2016-06-06 DIAGNOSIS — R42 Dizziness and giddiness: Secondary | ICD-10-CM | POA: Diagnosis not present

## 2016-06-06 DIAGNOSIS — R296 Repeated falls: Secondary | ICD-10-CM | POA: Diagnosis not present

## 2016-06-06 DIAGNOSIS — M6281 Muscle weakness (generalized): Secondary | ICD-10-CM

## 2016-06-06 NOTE — Therapy (Signed)
[facility] [address] [place], [place], [address] Phone: [phone]   Fax:  [phone]  Physical Therapy Treatment  Patient Details  Name: Sarah Boyd MRN: [id number] Date of Birth: 1930-05-06 Referring Provider: Dr. Rexene Alberts  Encounter Date: 06/06/2016      PT End of Session - 06/06/16 1144    Visit Number 8   Number of Visits 12   Date for PT Re-Evaluation 06/10/16   Authorization Type [employer]: G-CODE AND PROGRESS NOTE EVERY 10TH VISIT. Waiting for auth info.   PT Start Time 1101   PT Stop Time 1140   PT Time Calculation (min) 39 min   Equipment Utilized During Treatment Gait belt   Activity Tolerance Patient tolerated treatment well   Behavior During Therapy WFL for tasks assessed/performed      Past Medical History:  Diagnosis Date  . Asthma   . History of breast cancer S/P RIGHT MASTECTOMY AND CHEMO 21 YRS AGO (APPROX.  1990)   NO RECURRENCE  . HOH (hard of hearing) NO AIDS  . Hypothyroidism   . Labral tear of shoulder RIGHT SHOULDER  . Right shoulder pain     Past Surgical History:  Procedure Laterality Date  . CATARACT EXTRACTION, BILATERAL    . EXCISION OF RIGHT CHEST MASS  12-19-1999   BENIGN  . MASTECTOMY  1990  (APPROX.)   RIGHT BREAST CANCER -- NO RECURRENCE  . ORIF BILATERAL WRIST  2009  . SHOULDER ARTHROSCOPY  12/06/2011   Procedure: ARTHROSCOPY SHOULDER;  Surgeon: Magnus Sinning, MD;  Location: [facility];  Service: Orthopedics;  Laterality: Right;  WITH LABRIAL DEBRIDEMENT AND SAD INTERSCALINE BLOCK    There were no vitals filed for this visit.      Subjective Assessment - 06/06/16 1105    Subjective Pt denied falls or changes since last visit. Pt reported she feels better overall.    Patient is accompained by: Family member   Pertinent History Must have liquids thickened (1 powder scoop for 8oz.) per pt's niece, Hx of R breast CA, asthma, HOH,  osteoporsis, OA, hypothyroidism, Hx of R shoulder labral tear (unsure of the year but states it was a long time ago), Hx of pelvic fx   Patient Stated Goals Walk better   Currently in Pain? No/denies                         [facility] Adult PT Treatment/Exercise - 06/06/16 1108      Ambulation/Gait   Ambulation/Gait Yes   Ambulation/Gait Assistance 6: Modified independent (Device/Increase time)   Ambulation/Gait Assistance Details Pt demonstrated safe and proper technique during amb. with rollator. Pt able to avoid obstacles.    Ambulation Distance (Feet) 650 Feet   Assistive device Rollator   Gait Pattern Step-through pattern   Ambulation Surface Level;Indoor   Gait velocity 2.16f/sec. and 2.472fsec.     Standardized Balance Assessment   Standardized Balance Assessment Berg Balance Test     Berg Balance Test   Sit to Stand Able to stand without using hands and stabilize independently   Standing Unsupported Able to stand 2 minutes with supervision   Sitting with Back Unsupported but Feet Supported on Floor or Stool Able to sit safely and securely 2 minutes   Stand to Sit Sits safely with minimal use of hands   Transfers Able to transfer safely, minor use of hands   Standing Unsupported with Eyes Closed Able  to stand 10 seconds safely   Standing Ubsupported with Feet Together Able to place feet together independently and stand for 1 minute with supervision   From Standing, Reach Forward with Outstretched Arm Can reach forward >12 cm safely (5")   From Standing Position, Pick up Object from [place] to pick up shoe, needs supervision   From Standing Position, Turn to Look Behind Over each Shoulder Looks behind one side only/other side shows less weight shift   Turn 360 Degrees Needs close supervision or verbal cueing   Standing Unsupported, Alternately Place Feet on Step/Stool Able to complete >2 steps/needs minimal assist   Standing Unsupported, One Foot in Mobile  to plae foot ahead of the other independently and hold 30 seconds   Standing on One Leg Tries to lift leg/unable to hold 3 seconds but remains standing independently   Total Score 41           Self care:     PT Education - 06/06/16 1143    Education provided Yes   Education Details PT reiterated the importance of using rollator at all times and educated pt and niece to obtain a RW to sidestep into bathroom at home, as rollator does not fit into bathroom. PT discussed goal progress and outcome measures and d/c. Pt agreeable. PT educated pt on not looking down while stepping while bifocals are donned, as this can cause depth perception issues and dizziness. PT also reiterated the  importance of staying hydrated to reduce lightheadedness.    Person(s) Educated Associate Professor)  niece: diane   Methods Explanation   Comprehension Verbalized understanding          PT Short Term Goals - 05/16/16 0938      PT SHORT TERM GOAL #1   Title Pt will be IND in HEP to improve balance, reduce dizziness, and improve strength. TARGET DATE FOR ALL STGS: 05/09/16   Baseline 05/16/2016 Met pt reports she perfrome her HEP once a day in the bed.   Status Achieved     PT SHORT TERM GOAL #2   Title Pt will improve gait speed with LRAD to >/=2.60f/sec to improve functional mobility.    Status Achieved     PT SHORT TERM GOAL #3   Title Perform DGI and write goal.    Baseline Revised to write BERG goal vs. DGI 2/2 falls risk and DGI and BERG scores   Status Revised     PT SHORT TERM GOAL #4   Title Pt will amb. 300' over even terrain, while performing head turns, at MOD I level with LRAD to improve functional mobility.    Status Achieved           PT Long Term Goals - 06/06/16 1145      PT LONG TERM GOAL #1   Title Pt will verbalize understanding of fall prevention strategies to decr. falls risk. TARGET DATE FOR ALL LTGS: 06/06/16   Status Achieved     PT LONG TERM GOAL #2   Title  Pt will amb. 600' over even and paved surfaces with LRAD, at MOD I level in order to improve functional mobility and attend church.    Status Achieved     PT LONG TERM GOAL #3   Title Pt will amb. >/=2.655fsec. with LRAD to amb. safely in the community.    Status Achieved     PT LONG TERM GOAL #4   Title Pt will improve DHI score from 50% to 32%  to improve quality of life and to reduce impact of dizziness on functional abilities.    Status Not Met     PT LONG TERM GOAL #5   Title Pt will improve BERG score to >/=41/56 to decr. falls risk.    Status Partially Met               Plan - Jun 07, 2016 1145    Clinical Impression Statement Pt met all goals, please see pt d/c summary for details.    Rehab Potential Good   Clinical Impairments Affecting Rehab Potential co-morbidities and pt only able to come 1x/week due to transportation and distance   PT Frequency 2x / week   PT Duration 8 weeks   PT Treatment/Interventions ADLs/Self Care Home Management;Biofeedback;Canalith Repostioning;Neuromuscular re-education;Balance training;Therapeutic exercise;Therapeutic activities;Manual techniques;Functional mobility training;Stair training;Gait training;DME Instruction;Patient/family education;Vestibular   PT Next Visit Plan d/c.   Consulted and Agree with Plan of Care Patient;Family member/caregiver   Family Member Consulted niece: [name]      Patient will benefit from skilled therapeutic intervention in order to improve the following deficits and impairments:  Abnormal gait, Decreased endurance, Decreased strength, Decreased balance, Decreased mobility, Dizziness, Decreased range of motion, Impaired flexibility, Postural dysfunction, Impaired UE functional use  Visit Diagnosis: Other abnormalities of gait and mobility  Muscle weakness (generalized)  Dizziness and giddiness       G-Codes - Jun 07, 2016 1147    Functional Assessment Tool Used Gait speed with rollator: 2.19f/secft/sec.;  BERG: 41/56   Functional Limitation Mobility: Walking and moving around   Mobility: Walking and Moving Around Current Status [phone] At least 20 percent but less than 40 percent impaired, limited or restricted   Mobility: Walking and Moving Around Goal Status [phone] At least 1 percent but less than 20 percent impaired, limited or restricted   Mobility: Walking and Moving Around Discharge Status [phone] At least 20 percent but less than 40 percent impaired, limited or restricted      Problem List Patient Active Problem List   Diagnosis Date Noted  . Frequent falls 04/15/2015  . Asthma, chronic 12/05/2012  . Osteoporosis 12/05/2012  . Hypothyroidism 12/05/2012    Case Vassell L 1December 27, 2017 11:59 AM  [place] [address]: [phone]  Fax:  [phone] Name: Sarah TRIGUEROSMRN: [id number] of Birth: [birth year] PHYSICAL THERAPY DISCHARGE SUMMARY  Visits from Start of Care: 8  Current functional level related to goals / functional outcomes:     PT Long Term Goals - 112/27/171145      PT LONG TERM GOAL #1   Title Pt will verbalize understanding of fall prevention strategies to decr. falls risk. TARGET DATE FOR ALL LTGS: 112/27/2017  Status Achieved     PT LONG TERM GOAL #2   Title Pt will amb. 600' over even and paved surfaces with LRAD, at MOD I level in order to improve functional mobility and attend church.    Status Achieved     PT LONG TERM GOAL #3   Title Pt will amb. >/=2.668fsec. with LRAD to amb. safely in the community.    Status Achieved     PT LONG TERM GOAL #4   Title Pt will improve DHI score from 50% to 32% to improve quality of life and to reduce impact of dizziness on functional abilities.    Status Not Met     PT LONG TERM GOAL #5   Title Pt  will improve BERG score to >/=41/56 to decr. falls risk.    Status Partially Met        Remaining  deficits: Impaired balance without rollator   Education / Equipment: HEP and importance of staying hydrated and using rollator at all times.   Plan: Patient agrees to discharge.  Patient goals were met. Patient is being discharged due to meeting the stated rehab goals.  ?????       Geoffry Paradise, PT,DPT 06/06/16 11:59 AM Phone: [phone] Fax: [phone]

## 2016-06-15 ENCOUNTER — Telehealth: Payer: Self-pay | Admitting: Nurse Practitioner

## 2016-06-15 DIAGNOSIS — L308 Other specified dermatitis: Secondary | ICD-10-CM

## 2016-06-15 DIAGNOSIS — L299 Pruritus, unspecified: Secondary | ICD-10-CM

## 2016-06-15 NOTE — Telephone Encounter (Signed)
Calling to talk to someone about issues her aunt is having with referrals.  Would someone please call her.  Aunt is supposed to have an appt with [facility] in Dec but it was canceled because no referral was done.

## 2016-07-05 DIAGNOSIS — J60 Coalworker's pneumoconiosis: Secondary | ICD-10-CM | POA: Diagnosis not present

## 2016-07-12 DIAGNOSIS — L280 Lichen simplex chronicus: Secondary | ICD-10-CM | POA: Diagnosis not present

## 2016-07-12 DIAGNOSIS — E8589 Other amyloidosis: Secondary | ICD-10-CM | POA: Diagnosis not present

## 2016-08-09 ENCOUNTER — Other Ambulatory Visit: Payer: Self-pay | Admitting: Nurse Practitioner

## 2016-08-09 DIAGNOSIS — M810 Age-related osteoporosis without current pathological fracture: Secondary | ICD-10-CM

## 2016-08-11 ENCOUNTER — Encounter: Payer: Self-pay | Admitting: Nurse Practitioner

## 2016-08-11 ENCOUNTER — Ambulatory Visit (INDEPENDENT_AMBULATORY_CARE_PROVIDER_SITE_OTHER): Payer: Medicare HMO | Admitting: Nurse Practitioner

## 2016-08-11 DIAGNOSIS — J4520 Mild intermittent asthma, uncomplicated: Secondary | ICD-10-CM

## 2016-08-11 DIAGNOSIS — E034 Atrophy of thyroid (acquired): Secondary | ICD-10-CM | POA: Diagnosis not present

## 2016-08-11 DIAGNOSIS — M810 Age-related osteoporosis without current pathological fracture: Secondary | ICD-10-CM

## 2016-08-11 MED ORDER — LEVOTHYROXINE SODIUM 25 MCG PO TABS: 25.0000 ug | ORAL_TABLET | Freq: Every day | ORAL | 5 refills | Status: DC

## 2016-08-11 MED ORDER — ALENDRONATE SODIUM 70 MG PO TABS: 70.0000 mg | ORAL_TABLET | ORAL | 3 refills | Status: DC

## 2016-08-11 MED ORDER — MONTELUKAST SODIUM 10 MG PO TABS: 10.0000 mg | ORAL_TABLET | Freq: Every day | ORAL | 5 refills | Status: DC

## 2016-08-11 NOTE — Patient Instructions (Signed)
Fall Prevention in the Home Falls can cause injuries. They can happen to people of all ages. There are many things you can do to make your home safe and to help prevent falls. What can I do on the outside of my home?  Regularly fix the edges of walkways and driveways and fix any cracks.  Remove anything that might make you trip as you walk through a door, such as a raised step or threshold.  Trim any bushes or trees on the path to your home.  Use bright outdoor lighting.  Clear any walking paths of anything that might make someone trip, such as rocks or tools.  Regularly check to see if handrails are loose or broken. Make sure that both sides of any steps have handrails.  Any raised decks and porches should have guardrails on the edges.  Have any leaves, snow, or ice cleared regularly.  Use sand or salt on walking paths during winter.  Clean up any spills in your garage right away. This includes oil or grease spills. What can I do in the bathroom?  Use night lights.  Install grab bars by the toilet and in the tub and shower. Do not use towel bars as grab bars.  Use non-skid mats or decals in the tub or shower.  If you need to sit down in the shower, use a plastic, non-slip stool.  Keep the floor dry. Clean up any water that spills on the floor as soon as it happens.  Remove soap buildup in the tub or shower regularly.  Attach bath mats securely with double-sided non-slip rug tape.  Do not have throw rugs and other things on the floor that can make you trip. What can I do in the bedroom?  Use night lights.  Make sure that you have a light by your bed that is easy to reach.  Do not use any sheets or blankets that are too big for your bed. They should not hang down onto the floor.  Have a firm chair that has side arms. You can use this for support while you get dressed.  Do not have throw rugs and other things on the floor that can make you trip. What can I do in the  kitchen?  Clean up any spills right away.  Avoid walking on wet floors.  Keep items that you use a lot in easy-to-reach places.  If you need to reach something above you, use a strong step stool that has a grab bar.  Keep electrical cords out of the way.  Do not use floor polish or wax that makes floors slippery. If you must use wax, use non-skid floor wax.  Do not have throw rugs and other things on the floor that can make you trip. What can I do with my stairs?  Do not leave any items on the stairs.  Make sure that there are handrails on both sides of the stairs and use them. Fix handrails that are broken or loose. Make sure that handrails are as long as the stairways.  Check any carpeting to make sure that it is firmly attached to the stairs. Fix any carpet that is loose or worn.  Avoid having throw rugs at the top or bottom of the stairs. If you do have throw rugs, attach them to the floor with carpet tape.  Make sure that you have a light switch at the top of the stairs and the bottom of the stairs. If you do   not have them, ask someone to add them for you. What else can I do to help prevent falls?  Wear shoes that:  Do not have high heels.  Have rubber bottoms.  Are comfortable and fit you well.  Are closed at the toe. Do not wear sandals.  If you use a stepladder:  Make sure that it is fully opened. Do not climb a closed stepladder.  Make sure that both sides of the stepladder are locked into place.  Ask someone to hold it for you, if possible.  Clearly mark and make sure that you can see:  Any grab bars or handrails.  First and last steps.  Where the edge of each step is.  Use tools that help you move around (mobility aids) if they are needed. These include:  Canes.  Walkers.  Scooters.  Crutches.  Turn on the lights when you go into a dark area. Replace any light bulbs as soon as they burn out.  Set up your furniture so you have a clear path.  Avoid moving your furniture around.  If any of your floors are uneven, fix them.  If there are any pets around you, be aware of where they are.  Review your medicines with your doctor. Some medicines can make you feel dizzy. This can increase your chance of falling. Ask your doctor what other things that you can do to help prevent falls. This information is not intended to replace advice given to you by your health care provider. Make sure you discuss any questions you have with your health care provider. Document Released: 03/25/2009 Document Revised: 11/04/2015 Document Reviewed: 07/03/2014 Elsevier Interactive Patient Education  2017 [employer]  

## 2016-08-11 NOTE — Progress Notes (Signed)
   Subjective:    Patient ID: Sarah Boyd, female    DOB: Feb 16, 1930, 83 y.o.   MRN: [id number]  HPI  Patient in today for follow up of chronic medical problems. There have been no changes since last visit. SHe has no complaints today  Hypothyroidism Currently on synthroid 60mg daily- no complaints Asthma Takes singulair and advair daily has not need albuterol in quite sometime. Osteoporosis Fosamax weekly- has some back pain that she takes ultram for.  Review of Systems  Constitutional: Negative.   HENT: Negative.   Respiratory: Negative.   Genitourinary: Negative.   Neurological: Negative.   Psychiatric/Behavioral: Negative.   All other systems reviewed and are negative.      Objective:   Physical Exam  Constitutional: She is oriented to person, place, and time. She appears well-developed and well-nourished.  HENT:  Nose: Nose normal.  Mouth/Throat: Oropharynx is clear and moist.  Eyes: EOM are normal.  Neck: Trachea normal, normal range of motion and full passive range of motion without pain. Neck supple. No JVD present. Carotid bruit is not present. No thyromegaly present.  Cardiovascular: Normal rate, regular rhythm, normal heart sounds and intact distal pulses.  Exam reveals no gallop and no friction rub.   No murmur heard. Pulmonary/Chest: Effort normal and breath sounds normal.  Abdominal: Soft. Bowel sounds are normal. She exhibits no distension and no mass. There is no tenderness.  Musculoskeletal: Normal range of motion.  Patient walking with walker- steady and slow  Lymphadenopathy:    She has no cervical adenopathy.  Neurological: She is alert and oriented to person, place, and time. She has normal reflexes. No cranial nerve deficit.  Skin: Skin is warm and dry.  Psychiatric: She has a normal mood and affect. Her behavior is normal. Judgment and thought content normal.    BP 120/64   Pulse 78   Temp 97.8 F (36.6 C) (Oral)   Ht '5\' 1"'$  (1.549 m)   Wt  102 lb (46.3 kg)   BMI 19.27 kg/m       Assessment & Plan:  1. Hypothyroidism due to acquired atrophy of thyroid - levothyroxine (SYNTHROID, LEVOTHROID) 25 MCG tablet; Take 1 tablet (25 mcg total) by mouth daily before breakfast.  Dispense: 30 tablet; Refill: 5 - CMP14+EGFR - Thyroid Panel With TSH  2. Mild intermittent chronic asthma without complication - montelukast (SINGULAIR) 10 MG tablet; Take 1 tablet (10 mg total) by mouth at bedtime.  Dispense: 30 tablet; Refill: 5  3. Age-related osteoporosis without current pathological fracture - alendronate (FOSAMAX) 70 MG tablet; Take 1 tablet (70 mg total) by mouth once a week. Take with a full glass of water on an empty stomach.  Dispense: 12 tablet; Refill: 3   Fall prevention discussed Labs pending Health maintenance reviewed Diet and exercise encouraged Continue all meds Follow up  In 6 months   [place] FNP

## 2016-08-12 LAB — CMP14+EGFR
A/G RATIO: 1.7 (ref 1.2–2.2)
ALK PHOS: 56 IU/L (ref 39–117)
ALT: 17 IU/L (ref 0–32)
AST: 27 IU/L (ref 0–40)
Albumin: 4.3 g/dL (ref 3.5–4.7)
BILIRUBIN TOTAL: 0.3 mg/dL (ref 0.0–1.2)
BUN/Creatinine Ratio: 21 (ref 12–28)
BUN: 16 mg/dL (ref 8–27)
CALCIUM: 9 mg/dL (ref 8.7–10.3)
CHLORIDE: 105 mmol/L (ref 96–106)
CO2: 27 mmol/L (ref 18–29)
Creatinine, Ser: 0.77 mg/dL (ref 0.57–1.00)
GFR calc Af Amer: 82 mL/min/{1.73_m2} (ref >59)
GFR, EST NON AFRICAN AMERICAN: 71 mL/min/{1.73_m2} (ref >59)
Globulin, Total: 2.5 g/dL (ref 1.5–4.5)
Glucose: 76 mg/dL (ref 65–99)
POTASSIUM: 4.4 mmol/L (ref 3.5–5.2)
Sodium: 145 mmol/L — ABNORMAL HIGH (ref 134–144)
Total Protein: 6.8 g/dL (ref 6.0–8.5)

## 2016-08-12 LAB — THYROID PANEL WITH TSH
Free Thyroxine Index: 1.7 (ref 1.2–4.9)
T3 UPTAKE RATIO: 27 % (ref 24–39)
T4 TOTAL: 6.2 ug/dL (ref 4.5–12.0)
TSH: 2.16 u[IU]/mL (ref 0.450–4.500)

## 2016-08-17 ENCOUNTER — Other Ambulatory Visit: Payer: Self-pay | Admitting: Nurse Practitioner

## 2016-08-17 DIAGNOSIS — M810 Age-related osteoporosis without current pathological fracture: Secondary | ICD-10-CM

## 2016-08-17 DIAGNOSIS — J4520 Mild intermittent asthma, uncomplicated: Secondary | ICD-10-CM

## 2016-09-26 ENCOUNTER — Telehealth: Payer: Self-pay | Admitting: *Deleted

## 2016-09-26 DIAGNOSIS — R2689 Other abnormalities of gait and mobility: Secondary | ICD-10-CM

## 2016-09-26 DIAGNOSIS — R296 Repeated falls: Secondary | ICD-10-CM

## 2016-09-26 DIAGNOSIS — R42 Dizziness and giddiness: Secondary | ICD-10-CM

## 2016-09-26 NOTE — Telephone Encounter (Signed)
I called pt. She says that at the last OV with Dr. Rexene Alberts, it was discussed that a referral to [facility] neurology was a good idea for pt's dizziness. Pt declined the referral at the time, but is requesting it now.  Pt gave the fax number to [facility] as [phone] ATTN: Kennyth Lose.

## 2016-09-26 NOTE — Telephone Encounter (Signed)
Referral placed to neuro at [facility].

## 2016-09-28 NOTE — Telephone Encounter (Signed)
Noted Referral has been sent.  °

## 2016-10-03 ENCOUNTER — Ambulatory Visit: Payer: Commercial Managed Care - HMO | Admitting: Neurology

## 2016-10-23 DIAGNOSIS — H903 Sensorineural hearing loss, bilateral: Secondary | ICD-10-CM | POA: Diagnosis not present

## 2016-10-26 DIAGNOSIS — L309 Dermatitis, unspecified: Secondary | ICD-10-CM | POA: Diagnosis not present

## 2016-11-14 ENCOUNTER — Telehealth: Payer: Self-pay

## 2016-11-14 DIAGNOSIS — R42 Dizziness and giddiness: Secondary | ICD-10-CM

## 2016-11-14 NOTE — Telephone Encounter (Signed)
Daughter called and said pt was seen at [facility] and they are requesting she be referred to Vestibular Rehab/Neurology located at [facility] in [place]. Phone [phone]  Please place the referral

## 2016-12-06 ENCOUNTER — Ambulatory Visit (INDEPENDENT_AMBULATORY_CARE_PROVIDER_SITE_OTHER): Payer: Medicare HMO | Admitting: *Deleted

## 2016-12-06 VITALS — BP 131/65 | HR 83 | Ht 60.0 in | Wt 102.0 lb

## 2016-12-06 DIAGNOSIS — Z0000 Encounter for general adult medical examination without abnormal findings: Secondary | ICD-10-CM | POA: Diagnosis not present

## 2016-12-06 DIAGNOSIS — Z23 Encounter for immunization: Secondary | ICD-10-CM

## 2016-12-06 NOTE — Patient Instructions (Addendum)
Ms. Trew , Thank you for taking time to come for your Medicare Wellness Visit. I appreciate your ongoing commitment to your health goals. Please review the following plan we discussed and let me know if I can assist you in the future.   These are the goals we discussed: Goals    . Exercise 150 minutes per week (moderate activity)       -Bring a signed/notarized copy of Advance Directives back to our office. -Continue to stay physically active. -Try to empty your bladder frequently so that you don't have to go urgently. Having to urinate quickly can increase your risk for falls. -Move carefully to avoid falls  This is a list of the screening recommended for you and due dates:  Health Maintenance  Topic Date Due  . Flu Shot  01/10/2017  . Mammogram  03/27/2017  . Tetanus Vaccine  12/07/2026  . DEXA scan (bone density measurement)  Completed  . Pneumonia vaccines  Completed     Diphtheria/Tetanus Toxoids; Pertussis Vaccine, DTP injection What is this medicine? DIPHTHERIA and TETANUS TOXOIDS; PERTUSSIS VACCINE (dif THEER ee uh and TET n [place] TOK soids; per TUS iss VAK seen) is used to prevent diphtheria, tetanus, and pertussis infections. This medicine may be used for other purposes; ask your health care provider or pharmacist if you have questions. COMMON BRAND NAME(S): Adacel, Boostrix, Certiva, Daptacel, Infanrix, Tripedia What should I tell my health care provider before I take this medicine? They need to know if you have any of these conditions: -blood disorders like hemophilia -fever or infection -immune system problems -neurologic disease -seizures -an unusual or allergic reaction to vaccines, thimerosal, latex, other medicines, foods, dyes, or preservatives -pregnant or trying to get pregnant -breast-feeding How should I use this medicine? This vaccine is for injection into a muscle. It is given by a health care professional. A copy of Vaccine Information Statements  will be given before each vaccination. Read this sheet carefully each time. The sheet may change frequently. Talk to your pediatrician regarding the use of this vaccine in children. While the DTP vaccine may be given to children ages 35 weeks to 7 years and the Tdap vaccine may be given to children at least 54 years old, precautions do apply. Overdosage: If you think you have taken too much of this medicine contact a poison control center or emergency room at once. NOTE: This medicine is only for you. Do not share this medicine with others. What if I miss a dose? It is important not to miss your dose. Call your doctor or health care professional if you are unable to keep an appointment. What may interact with this medicine? -immune globulin -medicines that suppress your immune function like adalimumab, anakinra, infliximab -medicines to treat cancer -medicines that treat or prevent blood clots like warfarin, enoxaparin, and dalteparin -steroid medicines like prednisone or cortisone This list may not describe all possible interactions. Give your health care provider a list of all the medicines, herbs, non-prescription drugs, or dietary supplements you use. Also tell them if you smoke, drink alcohol, or use illegal drugs. Some items may interact with your medicine. What should I watch for while using this medicine? See your health care provider for all shots of this vaccine as directed. To have protection from infection, you must have 3 shots of this vaccine plus boosters as needed. Tell your doctor right away if you have any serious or unusual side effects after getting this vaccine. What side effects may  I notice from receiving this medicine? Side effects that you should report to your doctor or health care professional as soon as possible: -allergic reactions like skin rash, itching or hives, swelling of the face, lips, or tongue -breathing problems -fever of 103 degrees F or more -flu-like  symptoms -inconsolable crying -infection -pain, tingling, numbness in the hands or feet -seizures -swelling of arm or leg that was injected -unusually weak or tired Side effects that usually do not require medical attention (report to your doctor or health care professional if they continue or are bothersome): -fussy, irritable -loss of appetite -low-grade fever -pain, tenderness, redness, swelling, or a 'knot' at site where injected -vomiting This list may not describe all possible side effects. Call your doctor for medical advice about side effects. You may report side effects to FDA at 1-800-FDA-1088. Where should I keep my medicine? This drug is given in a hospital or clinic and will not be stored at home. NOTE: This sheet is a summary. It may not cover all possible information. If you have questions about this medicine, talk to your doctor, pharmacist, or health care provider.  2018 Elsevier/Gold Standard (2015-07-01 12:43:42)

## 2016-12-06 NOTE — Progress Notes (Signed)
Subjective:   Sarah Boyd is a 83 y.o. female who presents for an Initial Medicare Annual Wellness Visit. [name] lives with her niece in her home She does not have any children. She enjoys doing puzzle books and reading.   Review of Systems    Reports that her health is about the same as last year.  Cardiac Risk Factors include: advanced age (>61men, >3 women)  Neuro: Dizziness/vertigo. Has seen neurologist and been referred to vestibular rehab but is waiting on appt.  Other systems negative.    Objective:    Today's Vitals   12/06/16 1603  BP: 131/65  Pulse: 83  Weight: 102 lb (46.3 kg)  Height: 5' (1.524 m)   Body mass index is 19.92 kg/m.   Current Medications (verified) Outpatient Encounter Prescriptions as of 12/06/2016  Medication Sig  . albuterol (PROVENTIL) (2.5 MG/3ML) 0.083% nebulizer solution Take 3 mLs (2.5 mg total) by nebulization every 6 (six) hours as needed for wheezing or shortness of breath.  Marland Kitchen alendronate (FOSAMAX) 70 MG tablet Take 1 tablet (70 mg total) by mouth once a week. Take with a full glass of water on an empty stomach.  . Calcium Carbonate-Vitamin D (CALCIUM 600+D) 600-400 MG-UNIT tablet Take 1 tablet by mouth daily.  . cetirizine (ZYRTEC) 10 MG tablet Take 10 mg by mouth daily.  . Fluticasone-Salmeterol (ADVAIR) 100-50 MCG/DOSE AEPB Inhale 1 puff into the lungs 2 (two) times daily.  . hydrOXYzine (ATARAX/VISTARIL) 10 MG tablet TAKE 1 TABLET BY MOUTH 1 HOUR BEFORE BED  . levothyroxine (SYNTHROID, LEVOTHROID) 25 MCG tablet Take 1 tablet (25 mcg total) by mouth daily before breakfast.  . montelukast (SINGULAIR) 10 MG tablet Take 1 tablet (10 mg total) by mouth at bedtime.  . Multiple Vitamin (MULTIVITAMIN WITH MINERALS) TABS tablet Take 1 tablet by mouth daily.  . [DISCONTINUED] alendronate (FOSAMAX) 70 MG tablet TAKE 1 TABLET BY MOUTH ONCE A WEEK  . [DISCONTINUED] montelukast (SINGULAIR) 10 MG tablet TAKE 1 TABLET (10 MG TOTAL) BY MOUTH  AT BEDTIME.   No facility-administered encounter medications on file as of 12/06/2016.     Allergies (verified) Chocolate and Benadryl [diphenhydramine hcl]   History: Past Medical History:  Diagnosis Date  . Asthma   . History of breast cancer S/P RIGHT MASTECTOMY AND CHEMO 21 YRS AGO (APPROX.  1990)   NO RECURRENCE  . HOH (hard of hearing) NO AIDS  . Hypothyroidism   . Labral tear of shoulder RIGHT SHOULDER  . Right shoulder pain    Past Surgical History:  Procedure Laterality Date  . CATARACT EXTRACTION, BILATERAL    . EXCISION OF RIGHT CHEST MASS  12-19-1999   BENIGN  . MASTECTOMY  1990  (APPROX.)   RIGHT BREAST CANCER -- NO RECURRENCE  . ORIF BILATERAL WRIST  2009  . SHOULDER ARTHROSCOPY  12/06/2011   Procedure: ARTHROSCOPY SHOULDER;  Surgeon: Magnus Sinning, MD;  Location: [facility];  Service: Orthopedics;  Laterality: Right;  WITH LABRIAL DEBRIDEMENT AND SAD INTERSCALINE BLOCK   Family History  Problem Relation Age of Onset  . Stroke Mother   . Rheumatic fever Father    Social History   Occupational History  . Retired    Social History Main Topics  . Smoking status: Never Smoker  . Smokeless tobacco: Never Used  . Alcohol use No  . Drug use: No  . Sexual activity: No    Tobacco Counseling No tobacco use  Activities of Daily Living In  your present state of health, do you have any difficulty performing the following activities: 12/06/2016  Hearing? Y  Vision? N  Difficulty concentrating or making decisions? Y  Walking or climbing stairs? N  Dressing or bathing? N  Doing errands, shopping? Y  Preparing Food and eating ? Y  Using the Toilet? N  In the past six months, have you accidently leaked urine? Y  Do you have problems with loss of bowel control? N  Managing your Medications? N  Managing your Finances? N  Housekeeping or managing your Housekeeping? Y  Some recent data might be hidden    Immunizations and Health  Maintenance Immunization History  Administered Date(s) Administered  . Influenza, High Dose Seasonal PF 05/08/2016  . Influenza,inj,Quad PF,36+ Mos 04/28/2013, 04/09/2014, 04/22/2015  . Pneumococcal Conjugate-13 04/22/2015  . Pneumococcal Polysaccharide-23 01/22/2014  . Tdap 12/06/2016   There are no preventive care reminders to display for this patient.  Patient Care Team: Chevis Pretty, FNP as PCP - General (Family Medicine) Sandford Craze, MD as Referring Physician (Dermatology) Izora Gala, MD as Consulting Physician (Otolaryngology) Star Age, MD as Attending Physician (Neurology) Shon Hough, MD as Consulting Physician (Ophthalmology)  No hospitalizations, ER Visits, or surgeries this past year.     Assessment:   This is a routine wellness examination for Sarah Boyd.   Hearing/Vision screen Some hearing loss detected during visit. No obvious vision problems. Last exam was 08/2016 with Dr Kathrin Penner in [place].  Dietary issues and exercise activities discussed: Current Exercise Habits: Home exercise routine, Type of exercise: walking, Time (Minutes): 30, Frequency (Times/Week): 4, Weekly Exercise (Minutes/Week): 120, Intensity: Moderate, Exercise limited by: orthopedic condition(s)  Diet: Eats 2 full meals and a light lunch/snack each day. Her niece prepares her meals.   Goals    . Exercise 150 minutes per week (moderate activity)      Depression Screen PHQ 2/9 Scores 12/06/2016 05/08/2016 02/04/2016 08/05/2015 02/01/2015 07/31/2014 01/22/2014  PHQ - 2 Score 1 0 0 0 0 1 0    Fall Risk Fall Risk  12/06/2016 05/08/2016 03/29/2016 02/04/2016 12/06/2015  Falls in the past year? Yes No Yes Yes Yes  Number falls in past yr: 2 or more - 2 or more 2 or more 2 or more  Injury with Fall? No - No No Yes  Risk Factor Category  High Fall Risk - - - High Fall Risk  Risk for fall due to : Impaired balance/gait;History of fall(s) - Impaired balance/gait - -  Risk for  fall due to (comments): vertigo - - - -  Follow up Falls prevention discussed - Falls prevention discussed - Falls evaluation completed  Uses a walker  Cognitive Function: MMSE - Mini Mental State Exam 12/06/2016  Orientation to time 5  Orientation to Place 4  Registration 3  Attention/ Calculation 3  Recall 0  Language- name 2 objects 0  Language- repeat 1  Language- follow 3 step command 3  Language- read & follow direction 1  Write a sentence 1  Copy design 0  Total score 21  I am concerned about her memory. She had difficulty with recall and following directions. I will ask her PCP to f/u on this at her next visit.      Screening Tests Health Maintenance  Topic Date Due  . INFLUENZA VACCINE  01/10/2017  . MAMMOGRAM  03/27/2017  . TETANUS/TDAP  12/07/2026  . DEXA SCAN  Completed  . PNA vac Low Risk Adult  Completed  Plan:  -Appt with Chevis Pretty, FNP on 02/13/17. -Will need Dexa scan at that appt -Tdap given today. -Shingrix discussed but declined -Advanced Directives given and discussed. Patient will bring a signed/notarized copy back -Encouraged her to move carefully to avoid falls and to use walker at all times -Make sure that there is ample lighting for nighttime bathroom trips -Empty bladder frequently during the day to decrease urgency which can increase risk for falls -Use a pill box with separate morning and evening compartments -Consider rejoining [facility] Sne[facility] program at the [facility] -I will f/u with the referral department about her referral to vestibular rehab  I have personally reviewed and noted the following in the patient's chart:   . Medical and social history . Use of alcohol, tobacco or illicit drugs  . Current medications and supplements . Functional ability and status . Nutritional status . Physical activity . Advanced directives . List of other physicians . Hospitalizations, surgeries, and ER visits in previous 12  months . Vitals . Screenings to include cognitive, depression, and falls . Referrals and appointments  In addition, I have reviewed and discussed with patient certain preventive protocols, quality metrics, and best practice recommendations. A written personalized care plan for preventive services as well as general preventive health recommendations were provided to patient.     Chong Sicilian, RN  12/06/2016   I have reviewed and agree with the above AWV documentation.   Assunta Found, MD [facility] 12/06/2016, 5:10 PM

## 2016-12-21 ENCOUNTER — Other Ambulatory Visit: Payer: Self-pay | Admitting: Nurse Practitioner

## 2016-12-21 DIAGNOSIS — J4520 Mild intermittent asthma, uncomplicated: Secondary | ICD-10-CM

## 2016-12-26 DIAGNOSIS — H9113 Presbycusis, bilateral: Secondary | ICD-10-CM | POA: Diagnosis not present

## 2016-12-29 ENCOUNTER — Other Ambulatory Visit: Payer: Self-pay | Admitting: *Deleted

## 2017-01-03 ENCOUNTER — Encounter (HOSPITAL_COMMUNITY): Payer: Self-pay | Admitting: *Deleted

## 2017-01-03 ENCOUNTER — Emergency Department (HOSPITAL_COMMUNITY): Payer: Medicare HMO

## 2017-01-03 ENCOUNTER — Emergency Department (HOSPITAL_COMMUNITY)
Admission: EM | Admit: 2017-01-03 | Discharge: 2017-01-04 | Disposition: A | Discharge status: Home / Self Care | Payer: Medicare HMO | Attending: Emergency Medicine | Admitting: Emergency Medicine

## 2017-01-03 DIAGNOSIS — Z79899 Other long term (current) drug therapy: Secondary | ICD-10-CM | POA: Insufficient documentation

## 2017-01-03 DIAGNOSIS — Z853 Personal history of malignant neoplasm of breast: Secondary | ICD-10-CM | POA: Diagnosis not present

## 2017-01-03 DIAGNOSIS — M545 Low back pain, unspecified: Secondary | ICD-10-CM

## 2017-01-03 DIAGNOSIS — E039 Hypothyroidism, unspecified: Secondary | ICD-10-CM | POA: Diagnosis not present

## 2017-01-03 DIAGNOSIS — W19XXXA Unspecified fall, initial encounter: Secondary | ICD-10-CM

## 2017-01-03 DIAGNOSIS — S22080A Wedge compression fracture of T11-T12 vertebra, initial encounter for closed fracture: Secondary | ICD-10-CM | POA: Diagnosis not present

## 2017-01-03 DIAGNOSIS — J45909 Unspecified asthma, uncomplicated: Secondary | ICD-10-CM | POA: Diagnosis not present

## 2017-01-03 MED ORDER — IBUPROFEN 400 MG PO TABS
400.0000 mg | ORAL_TABLET | Freq: Once | ORAL | Status: AC
  Administered 2017-01-03: 400 mg via ORAL
  Filled 2017-01-03: qty 1

## 2017-01-03 NOTE — ED Triage Notes (Signed)
Pt was walking without her walker and using a chair to help steady herself when she fell, c/o lower back pain,

## 2017-01-03 NOTE — ED Provider Notes (Signed)
[facility] DEPT Provider Note   CSN: [id number] Arrival date & time: 01/03/17  2132     History   Chief Complaint Chief Complaint  Patient presents with  . Fall    HPI Sarah Boyd is a 83 y.o. female presenting with persistent low back pain since falling prior to arrival this evening.  She normally uses a walker due to her chronic gait disturbance and balance problems but was not using it this evening when she fell onto hardwood flooring directly on her lower back. She denies any other complaint of pain including head or neck injury, hip, knee or upper back pain.  She was able to get up with assistance from her niece with whom she lives and has been ambulatory but with pain since the event.  She denies radiation of pain and any increased weakness in her legs since the event.  She has found rest to be her only reliever, although took some tylenol before arriving with some improvement as well.  The history is provided by the patient and a relative.    Past Medical History:  Diagnosis Date  . Asthma   . History of breast cancer S/P RIGHT MASTECTOMY AND CHEMO 21 YRS AGO (APPROX.  1990)   NO RECURRENCE  . HOH (hard of hearing) NO AIDS  . Hypothyroidism   . Labral tear of shoulder RIGHT SHOULDER  . Right shoulder pain     Patient Active Problem List   Diagnosis Date Noted  . Frequent falls 04/15/2015  . Asthma, chronic 12/05/2012  . Osteoporosis 12/05/2012  . Hypothyroidism 12/05/2012    Past Surgical History:  Procedure Laterality Date  . CATARACT EXTRACTION, BILATERAL    . EXCISION OF RIGHT CHEST MASS  12-19-1999   BENIGN  . MASTECTOMY  1990  (APPROX.)   RIGHT BREAST CANCER -- NO RECURRENCE  . ORIF BILATERAL WRIST  2009  . SHOULDER ARTHROSCOPY  12/06/2011   Procedure: ARTHROSCOPY SHOULDER;  Surgeon: Magnus Sinning, MD;  Location: [facility];  Service: Orthopedics;  Laterality: Right;  WITH LABRIAL DEBRIDEMENT AND SAD INTERSCALINE BLOCK    OB  History    Gravida Para Term Preterm AB Living             0   SAB TAB Ectopic Multiple Live Births                   Home Medications    Prior to Admission medications   Medication Sig Start Date End Date Taking? Authorizing Provider  acetaminophen (TYLENOL) 500 MG tablet Take 1,000 mg by mouth every 6 (six) hours as needed for moderate pain or headache.   Yes [provider]  Calcium Carbonate-Vitamin D (CALCIUM 600+D) 600-400 MG-UNIT tablet Take 1 tablet by mouth daily.   Yes [provider]  cetirizine (ZYRTEC) 10 MG tablet TAKE 1 TABLET (10 MG TOTAL) BY MOUTH AT BEDTIME. Patient taking differently: Take 10 mg by mouth 2 (two) times daily.  12/21/16  Yes Martin, Mary-Margaret, FNP  hydrOXYzine (ATARAX/VISTARIL) 10 MG tablet TAKE 1 TABLET BY MOUTH 1 HOUR BEFORE BED 07/13/16  Yes [provider]  levothyroxine (SYNTHROID, LEVOTHROID) 25 MCG tablet Take 1 tablet (25 mcg total) by mouth daily before breakfast. 08/11/16  Yes Hassell Done, Mary-Margaret, FNP  montelukast (SINGULAIR) 10 MG tablet Take 1 tablet (10 mg total) by mouth at bedtime. 08/11/16  Yes Hassell Done, Mary-Margaret, FNP  Multiple Vitamin (MULTIVITAMIN WITH MINERALS) TABS tablet Take 1 tablet by mouth daily.  Yes [provider]  Soft Lens Products (SALINE SENSITIVE EYES) SOLN Place 1-2 drops into both eyes 2 (two) times daily as needed.   Yes [provider]  triamcinolone cream (KENALOG) 0.1 % Apply 1 application topically 2 (two) times daily as needed. Apply to arms, back, legs, and hands as needed; do not apply to face or groin 11/22/16  Yes [provider]  alendronate (FOSAMAX) 70 MG tablet Take 1 tablet (70 mg total) by mouth once a week. Take with a full glass of water on an empty stomach. 08/11/16   Chevis Pretty, FNP    Family History Family History  Problem Relation Age of Onset  . Stroke Mother   . Rheumatic fever Father     Social History Social History    Substance Use Topics  . Smoking status: Never Smoker  . Smokeless tobacco: Never Used  . Alcohol use No     Allergies   Chocolate and Benadryl [diphenhydramine hcl]   Review of Systems Review of Systems  Constitutional: Negative for fever.  Respiratory: Negative for shortness of breath.   Cardiovascular: Negative for chest pain and leg swelling.  Gastrointestinal: Negative for abdominal distention, abdominal pain and constipation.  Genitourinary: Negative for difficulty urinating, dysuria, flank pain, frequency and urgency.  Musculoskeletal: Positive for back pain. Negative for gait problem, joint swelling and neck pain.  Skin: Negative for rash.  Neurological: Negative for weakness, numbness and headaches.     Physical Exam Updated Vital Signs BP 124/80 (BP Location: Right Arm)   Pulse 93   Temp 98.1 F (36.7 C) (Oral)   Resp 18   Ht 5\' 1"  (1.549 m)   Wt 46.3 kg (102 lb)   SpO2 99%   BMI 19.27 kg/m   Physical Exam  Constitutional: She appears well-developed and well-nourished.  HENT:  Head: Normocephalic.  Eyes: Conjunctivae are normal.  Neck: Normal range of motion. Neck supple.  Cardiovascular: Normal rate and intact distal pulses.   Pedal pulses normal.  Pulmonary/Chest: Effort normal.  Abdominal: Soft. Bowel sounds are normal. She exhibits no distension and no mass.  Musculoskeletal: Normal range of motion. She exhibits no edema.       Lumbar back: She exhibits bony tenderness. She exhibits no swelling, no edema and no spasm.       Back:  ttp mid lumbar with no palpable deformity or step offs.  No lateral soft tissue ttp.  Neurological: She is alert. She has normal strength. She displays no atrophy and no tremor. No sensory deficit. Gait normal.  Reflex Scores:      Patellar reflexes are 2+ on the right side and 2+ on the left side.      Achilles reflexes are 2+ on the right side and 2+ on the left side. No strength deficit noted in hip and knee flexor  and extensor muscle groups.  Ankle flexion and extension intact.  Skin: Skin is warm and dry.  Psychiatric: She has a normal mood and affect.  Nursing note and vitals reviewed.    ED Treatments / Results  Labs (all labs ordered are listed, but only abnormal results are displayed) Labs Reviewed - No data to display  EKG  EKG Interpretation None       Radiology Dg Lumbar Spine Complete  Result Date: 01/03/2017 CLINICAL DATA:  Ground level fall tonight while ambulating without her walker. EXAM: LUMBAR SPINE - COMPLETE 4+ VIEW COMPARISON:  11/21/2013 FINDINGS: Mild anterior compression of T12, new from 11/21/2013  and possibly recent. The lumbar vertebrae are normal in height. Moderate lumbar degenerative disc changes are present. Grade 1 degenerative spondylolisthesis is present at L5-S1, unchanged. IMPRESSION: Mild anterior T12 compression, possibly recent. No evidence of acute lumbar fracture. Electronically Signed   By: Andreas Newport M.D.   On: 01/03/2017 23:38    Procedures Procedures (including critical care time)  Medications Ordered in ED Medications  ibuprofen (ADVIL,MOTRIN) tablet 400 mg (400 mg Oral Given 01/03/17 2254)     Initial Impression / Assessment and Plan / ED Course  I have reviewed the triage vital signs and the nursing notes.  Pertinent labs & imaging results that were available during my care of the patient were reviewed by me and considered in my medical decision making (see chart for details).    Pt nontender at the T12 level.  She ambulated in dept with assistance, mild pain but tolerable per pt.  Discussed ice and heat tx. Tylenol.  F/u with pcp prn persistent or worsened sx.    Pt was seen by Dr Jeneen Rinks prior to dc.  Final Clinical Impressions(s) / ED Diagnoses   Final diagnoses:  Fall, initial encounter  Acute midline low back pain without sciatica    New Prescriptions New Prescriptions   No medications on file     Landis Martins 01/04/17 0015    Tanna Furry, MD 01/15/17 [phone]

## 2017-01-04 NOTE — Discharge Instructions (Signed)
It is important for you to use your walker at all times to prevent falls.  You may take tylenol for pain relief.  Apply ice to your lower back for the next 2 days (10 minute intervals) adding a heating pad 20 minutes several times daily starting on Saturday. There is a small compression fracture of your 12th thoracic vertebrae as discussed, but this is not wear your pain is, so I favor this being an old injury.  This should require no intervention.

## 2017-01-18 ENCOUNTER — Other Ambulatory Visit: Payer: Self-pay | Admitting: Nurse Practitioner

## 2017-01-18 DIAGNOSIS — E034 Atrophy of thyroid (acquired): Secondary | ICD-10-CM

## 2017-02-13 ENCOUNTER — Ambulatory Visit (INDEPENDENT_AMBULATORY_CARE_PROVIDER_SITE_OTHER): Payer: Medicare HMO | Admitting: Nurse Practitioner

## 2017-02-13 ENCOUNTER — Encounter: Payer: Self-pay | Admitting: Nurse Practitioner

## 2017-02-13 VITALS — BP 128/68 | HR 87 | Temp 97.0°F | Ht 61.0 in | Wt 103.0 lb

## 2017-02-13 DIAGNOSIS — E034 Atrophy of thyroid (acquired): Secondary | ICD-10-CM | POA: Diagnosis not present

## 2017-02-13 DIAGNOSIS — R296 Repeated falls: Secondary | ICD-10-CM | POA: Diagnosis not present

## 2017-02-13 DIAGNOSIS — J4520 Mild intermittent asthma, uncomplicated: Secondary | ICD-10-CM

## 2017-02-13 DIAGNOSIS — M810 Age-related osteoporosis without current pathological fracture: Secondary | ICD-10-CM | POA: Diagnosis not present

## 2017-02-13 LAB — CBC WITH DIFFERENTIAL/PLATELET
Basophils Absolute: 0 10*3/uL (ref 0.0–0.2)
Basos: 0 %
EOS (ABSOLUTE): 0.1 10*3/uL (ref 0.0–0.4)
EOS: 1 %
HEMOGLOBIN: 12.8 g/dL (ref 11.1–15.9)
Hematocrit: 39.3 % (ref 34.0–46.6)
IMMATURE GRANS (ABS): 0 10*3/uL (ref 0.0–0.1)
IMMATURE GRANULOCYTES: 0 %
LYMPHS ABS: 3.1 10*3/uL (ref 0.7–3.1)
LYMPHS: 46 %
MCH: 27.4 pg (ref 26.6–33.0)
MCHC: 32.6 g/dL (ref 31.5–35.7)
MCV: 84 fL (ref 79–97)
MONOCYTES: 8 %
Monocytes Absolute: 0.5 10*3/uL (ref 0.1–0.9)
Neutrophils Absolute: 3 10*3/uL (ref 1.4–7.0)
Neutrophils: 45 %
Platelets: 274 10*3/uL (ref 150–379)
RBC: 4.67 x10E6/uL (ref 3.77–5.28)
RDW: 14.7 % (ref 12.3–15.4)
WBC: 6.7 10*3/uL (ref 3.4–10.8)

## 2017-02-13 LAB — CMP14+EGFR
ALBUMIN: 4.3 g/dL (ref 3.5–4.7)
ALT: 14 IU/L (ref 0–32)
AST: 20 IU/L (ref 0–40)
Albumin/Globulin Ratio: 1.7 (ref 1.2–2.2)
Alkaline Phosphatase: 68 IU/L (ref 39–117)
BUN/Creatinine Ratio: 14 (ref 12–28)
BUN: 13 mg/dL (ref 8–27)
Bilirubin Total: 0.3 mg/dL (ref 0.0–1.2)
CALCIUM: 9.8 mg/dL (ref 8.7–10.3)
CO2: 27 mmol/L (ref 20–29)
CREATININE: 0.9 mg/dL (ref 0.57–1.00)
Chloride: 103 mmol/L (ref 96–106)
GFR calc Af Amer: 67 mL/min/{1.73_m2} (ref >59)
GFR, EST NON AFRICAN AMERICAN: 58 mL/min/{1.73_m2} — AB (ref >59)
GLOBULIN, TOTAL: 2.6 g/dL (ref 1.5–4.5)
GLUCOSE: 89 mg/dL (ref 65–99)
Potassium: 4.4 mmol/L (ref 3.5–5.2)
Sodium: 144 mmol/L (ref 134–144)
TOTAL PROTEIN: 6.9 g/dL (ref 6.0–8.5)

## 2017-02-13 LAB — LIPID PANEL
CHOL/HDL RATIO: 1.8 ratio (ref 0.0–4.4)
CHOLESTEROL TOTAL: 186 mg/dL (ref 100–199)
HDL: 105 mg/dL (ref >39)
LDL Calculated: 59 mg/dL (ref 0–99)
TRIGLYCERIDES: 110 mg/dL (ref 0–149)
VLDL Cholesterol Cal: 22 mg/dL (ref 5–40)

## 2017-02-13 MED ORDER — MONTELUKAST SODIUM 10 MG PO TABS: 10.0000 mg | ORAL_TABLET | Freq: Every day | ORAL | 5 refills | Status: DC

## 2017-02-13 MED ORDER — LEVOTHYROXINE SODIUM 25 MCG PO TABS: 25.0000 ug | ORAL_TABLET | Freq: Every day | ORAL | 2 refills | Status: DC

## 2017-02-13 MED ORDER — HYDROXYZINE HCL 10 MG PO TABS: ORAL_TABLET | ORAL | 3 refills | Status: DC

## 2017-02-13 NOTE — Progress Notes (Signed)
Subjective:    Patient ID: Sarah Boyd, female    DOB: May 22, 1930, 83 y.o.   MRN: [id number]  HPI  Sarah Boyd is here today for follow up of chronic medical problem.  Outpatient Encounter Prescriptions as of 02/13/2017  Medication Sig  . alendronate (FOSAMAX) 70 MG tablet Take 1 tablet (70 mg total) by mouth once a week. Take with a full glass of water on an empty stomach.  . Calcium Carbonate-Vitamin D (CALCIUM 600+D) 600-400 MG-UNIT tablet Take 1 tablet by mouth daily.  . cetirizine (ZYRTEC) 10 MG tablet TAKE 1 TABLET (10 MG TOTAL) BY MOUTH AT BEDTIME. (Patient taking differently: Take 10 mg by mouth 2 (two) times daily. )  . hydrOXYzine (ATARAX/VISTARIL) 10 MG tablet TAKE 1 TABLET BY MOUTH 1 HOUR BEFORE BED  . levothyroxine (SYNTHROID, LEVOTHROID) 25 MCG tablet TAKE 1 TABLET (25 MCG TOTAL) BY MOUTH DAILY BEFORE BREAKFAST.  . montelukast (SINGULAIR) 10 MG tablet Take 1 tablet (10 mg total) by mouth at bedtime.  . Multiple Vitamin (MULTIVITAMIN WITH MINERALS) TABS tablet Take 1 tablet by mouth daily.  . Soft Lens Products (SALINE SENSITIVE EYES) SOLN Place 1-2 drops into both eyes 2 (two) times daily as needed.  . triamcinolone cream (KENALOG) 0.1 % Apply 1 application topically 2 (two) times daily as needed. Apply to arms, back, legs, and hands as needed; do not apply to face or groin     1. Mild intermittent chronic asthma without complication  Patient is not on any inhalers- denies any SOB or wheezing. singulair helps  2. Hypothyroidism due to acquired atrophy of thyroid  No problems that she is aware of  3. Age-related osteoporosis without current pathological fracture  Patient does very little execise.  4. Frequent falls   Patinet fell in July at a restaurant- went to ER- no fractures- not c/o any pain today    New complaints: None today  Social history: Lives with niece and daughter    Review of Systems  Constitutional: Negative for activity change and  appetite change.  HENT: Negative.   Eyes: Negative for pain.  Respiratory: Negative for shortness of breath.   Cardiovascular: Negative for chest pain, palpitations and leg swelling.  Gastrointestinal: Negative for abdominal pain.  Endocrine: Negative for polydipsia.  Genitourinary: Negative.   Skin: Negative for rash.  Neurological: Negative for dizziness, weakness and headaches.  Hematological: Does not bruise/bleed easily.  Psychiatric/Behavioral: Negative.   All other systems reviewed and are negative.      Objective:   Physical Exam  Constitutional: She is oriented to person, place, and time. She appears well-developed and well-nourished.  HENT:  Nose: Nose normal.  Mouth/Throat: Oropharynx is clear and moist.  Eyes: EOM are normal.  Neck: Trachea normal, normal range of motion and full passive range of motion without pain. Neck supple. No JVD present. Carotid bruit is not present. No thyromegaly present.  Cardiovascular: Normal rate, regular rhythm, normal heart sounds and intact distal pulses.  Exam reveals no gallop and no friction rub.   No murmur heard. Pulmonary/Chest: Effort normal and breath sounds normal.  Abdominal: Soft. Bowel sounds are normal. She exhibits no distension and no mass. There is no tenderness.  Musculoskeletal: Normal range of motion.  Gait slow and steady- walking with walker.  Lymphadenopathy:    She has no cervical adenopathy.  Neurological: She is alert and oriented to person, place, and time. She has normal reflexes.  Skin: Skin is warm and dry.  Psychiatric:  She has a normal mood and affect. Her behavior is normal. Judgment and thought content normal.   BP 128/68   Pulse 87   Temp (!) 97 F (36.1 C) (Oral)   Ht '5\' 1"'  (1.549 m)   Wt 103 lb (46.7 kg)   BMI 19.46 kg/m      Assessment & Plan:  1. Mild intermittent chronic asthma without complication rest - montelukast (SINGULAIR) 10 MG tablet; Take 1 tablet (10 mg total) by mouth at  bedtime.  Dispense: 30 tablet; Refill: 5 - hydrOXYzine (ATARAX/VISTARIL) 10 MG tablet; TAKE 1 TABLET BY MOUTH 1 HOUR BEFORE BED  Dispense: 30 tablet; Refill: 3  2. Hypothyroidism due to acquired atrophy of thyroid - levothyroxine (SYNTHROID, LEVOTHROID) 25 MCG tablet; Take 1 tablet (25 mcg total) by mouth daily before breakfast.  Dispense: 30 tablet; Refill: 2 - CMP14+EGFR  3. Age-related osteoporosis without current pathological fracture Weight bearing exercises if can tolerate without falling - DG [facility] DEXA  4. Frequent falls Do not walk without walker - CBC with Differential/Platelet - Lipid panel    Labs pending Health maintenance reviewed Diet and exercise encouraged Continue all meds Follow up  In 3 months   [place], FNP

## 2017-02-13 NOTE — Patient Instructions (Signed)

## 2017-02-26 ENCOUNTER — Ambulatory Visit (INDEPENDENT_AMBULATORY_CARE_PROVIDER_SITE_OTHER): Payer: Medicare HMO

## 2017-02-26 DIAGNOSIS — M810 Age-related osteoporosis without current pathological fracture: Secondary | ICD-10-CM | POA: Diagnosis not present

## 2017-02-28 ENCOUNTER — Other Ambulatory Visit: Payer: Self-pay | Admitting: Nurse Practitioner

## 2017-02-28 ENCOUNTER — Encounter: Payer: Self-pay | Admitting: Family Medicine

## 2017-02-28 ENCOUNTER — Ambulatory Visit (INDEPENDENT_AMBULATORY_CARE_PROVIDER_SITE_OTHER): Payer: Medicare HMO | Admitting: Family Medicine

## 2017-02-28 VITALS — BP 138/72 | HR 75 | Temp 96.8°F | Ht 61.0 in | Wt 103.0 lb

## 2017-02-28 DIAGNOSIS — S301XXA Contusion of abdominal wall, initial encounter: Secondary | ICD-10-CM | POA: Diagnosis not present

## 2017-02-28 DIAGNOSIS — Z1231 Encounter for screening mammogram for malignant neoplasm of breast: Secondary | ICD-10-CM

## 2017-02-28 MED ORDER — DICLOFENAC SODIUM 50 MG PO TBEC: 50.0000 mg | DELAYED_RELEASE_TABLET | Freq: Two times a day (BID) | ORAL | 0 refills | Status: DC

## 2017-02-28 NOTE — Progress Notes (Signed)
Subjective:  Patient ID: [name], female    DOB: 1929/09/27  Age: 83 y.o. MRN: [id number]  CC: Fall (pt here today c/o falling last night in her bedroom, she caught herself on her bed and then slid to the side and hit the nightstand on her right side.)   HPI DOROTHYMAE MACIVER presents for fall - MOI noted above. Had stepped away from her walker to the closet and back to bed. Now having Right flank pain. No radiation. Hip not painful. Ambulatory as usual with walker. Some pain at flank - up too high for hip-  with walking. Denies dyspnea.  Depression screen [facility] 2/9 02/13/2017 12/06/2016 05/08/2016  Decreased Interest 0 0 0  Down, Depressed, Hopeless 0 1 0  PHQ - 2 Score 0 1 0    History Mysti has a past medical history of Asthma; History of breast cancer (S/P RIGHT MASTECTOMY AND CHEMO 21 YRS AGO (APPROX.  1990)); HOH (hard of hearing) (NO AIDS); Hypothyroidism; Labral tear of shoulder (RIGHT SHOULDER); and Right shoulder pain.   She has a past surgical history that includes EXCISION OF RIGHT CHEST MASS (12-19-1999); Mastectomy (1990  (APPROX.)); ORIF BILATERAL WRIST (2009); Cataract extraction, bilateral; and Shoulder arthroscopy (12/06/2011).   Her family history includes Rheumatic fever in her father; Stroke in her mother.She reports that she has never smoked. She has never used smokeless tobacco. She reports that she does not drink alcohol or use drugs.    ROS Review of Systems  Constitutional: Negative for activity change, appetite change and fever.  HENT: Negative for congestion, rhinorrhea and sore throat.   Eyes: Negative for visual disturbance.  Respiratory: Negative for cough and shortness of breath.   Cardiovascular: Negative for chest pain and palpitations.  Gastrointestinal: Negative for abdominal pain, diarrhea and nausea.  Genitourinary: Negative for dysuria. Flank pain: as in HPI.  Musculoskeletal: Negative for arthralgias and myalgias.    Objective:  BP  138/72   Pulse 75   Temp (!) 96.8 F (36 C) (Oral)   Ht 5\' 1"  (1.549 m)   Wt 103 lb (46.7 kg)   BMI 19.46 kg/m   BP Readings from Last 3 Encounters:  02/28/17 138/72  02/13/17 128/68  01/03/17 124/80    Wt Readings from Last 3 Encounters:  02/28/17 103 lb (46.7 kg)  02/13/17 103 lb (46.7 kg)  01/03/17 102 lb (46.3 kg)     Physical Exam  Constitutional: She is oriented to person, place, and time. She appears well-developed and well-nourished. No distress.  HENT:  Head: Normocephalic and atraumatic.  Eyes: Pupils are equal, round, and reactive to light. Conjunctivae and EOM are normal.  Neck: Normal range of motion. Neck supple.  Cardiovascular: Normal rate, regular rhythm and normal heart sounds.   No murmur heard. Pulmonary/Chest: Effort normal and breath sounds normal. No respiratory distress. She has no wheezes. She has no rales.  Abdominal: Soft. Bowel sounds are normal. She exhibits no distension. There is no tenderness.  Musculoskeletal: She exhibits tenderness (left midback just medial to CVA and posterior to rib 11). She exhibits no edema.       Lumbar back: She exhibits decreased range of motion, tenderness and spasm. She exhibits no deformity and normal pulse.  Neurological: She is alert and oriented to person, place, and time. She has normal reflexes.  Skin: Skin is warm and dry.  Psychiatric: She has a normal mood and affect. Her behavior is normal. Thought content normal.  Assessment & Plan:   Aashka was seen today for fall.  Diagnoses and all orders for this visit:  Contusion, flank, initial encounter  Other orders -     diclofenac (VOLTAREN) 50 MG EC tablet; Take 1 tablet (50 mg total) by mouth 2 (two) times daily.   Apply heat frequently. Use walker for all ambulation. Return for increase of pain, shortness of breath or nausea & vomiting    I am having Ms. Haxton start on diclofenac. I am also having her maintain her Calcium  Carbonate-Vitamin D, multivitamin with minerals, alendronate, cetirizine, triamcinolone cream, SALINE SENSITIVE EYES, levothyroxine, montelukast, and hydrOXYzine.  Allergies as of 02/28/2017      Reactions   Chocolate Shortness Of Breath   Benadryl [diphenhydramine Hcl] Other (See Comments)   Reaction:  Altered mental status       Medication List       Accurate as of 02/28/17 12:35 PM. Always use your most recent med list.          alendronate 70 MG tablet Commonly known as:  FOSAMAX Take 1 tablet (70 mg total) by mouth once a week. Take with a full glass of water on an empty stomach.   CALCIUM 600+D 600-400 MG-UNIT tablet Generic drug:  Calcium Carbonate-Vitamin D Take 1 tablet by mouth daily.   cetirizine 10 MG tablet Commonly known as:  ZYRTEC TAKE 1 TABLET (10 MG TOTAL) BY MOUTH AT BEDTIME.   diclofenac 50 MG EC tablet Commonly known as:  VOLTAREN Take 1 tablet (50 mg total) by mouth 2 (two) times daily.   hydrOXYzine 10 MG tablet Commonly known as:  ATARAX/VISTARIL TAKE 1 TABLET BY MOUTH 1 HOUR BEFORE BED   levothyroxine 25 MCG tablet Commonly known as:  SYNTHROID, LEVOTHROID Take 1 tablet (25 mcg total) by mouth daily before breakfast.   montelukast 10 MG tablet Commonly known as:  SINGULAIR Take 1 tablet (10 mg total) by mouth at bedtime.   multivitamin with minerals Tabs tablet Take 1 tablet by mouth daily.   SALINE SENSITIVE EYES Soln Place 1-2 drops into both eyes 2 (two) times daily as needed.   triamcinolone cream 0.1 % Commonly known as:  KENALOG Apply 1 application topically 2 (two) times daily as needed. Apply to arms, back, legs, and hands as needed; do not apply to face or groin            Discharge Care Instructions        Start     Ordered   02/28/17 0000  diclofenac (VOLTAREN) 50 MG EC tablet  2 times daily     02/28/17 1225       Follow-up: Return in about 2 weeks (around 03/14/2017).  Claretta Fraise, M.D.

## 2017-03-02 ENCOUNTER — Telehealth: Payer: Self-pay | Admitting: Family Medicine

## 2017-03-05 ENCOUNTER — Other Ambulatory Visit: Payer: Self-pay | Admitting: Family Medicine

## 2017-03-05 MED ORDER — MELOXICAM 7.5 MG PO TABS: 7.5000 mg | ORAL_TABLET | Freq: Every day | ORAL | 0 refills | Status: DC

## 2017-03-05 NOTE — Telephone Encounter (Signed)
I sent in the requested prescription 

## 2017-03-29 ENCOUNTER — Ambulatory Visit
Admission: RE | Admit: 2017-03-29 | Discharge: 2017-03-29 | Disposition: A | Discharge status: Home / Self Care | Payer: Medicare HMO | Source: Ambulatory Visit | Attending: Nurse Practitioner | Admitting: Nurse Practitioner

## 2017-03-29 DIAGNOSIS — Z1231 Encounter for screening mammogram for malignant neoplasm of breast: Secondary | ICD-10-CM | POA: Diagnosis not present

## 2017-03-29 HISTORY — DX: Malignant (primary) neoplasm, unspecified: C80.1

## 2017-04-13 DIAGNOSIS — H43813 Vitreous degeneration, bilateral: Secondary | ICD-10-CM | POA: Diagnosis not present

## 2017-04-13 DIAGNOSIS — H52203 Unspecified astigmatism, bilateral: Secondary | ICD-10-CM | POA: Diagnosis not present

## 2017-04-13 DIAGNOSIS — H524 Presbyopia: Secondary | ICD-10-CM | POA: Diagnosis not present

## 2017-04-13 DIAGNOSIS — Z961 Presence of intraocular lens: Secondary | ICD-10-CM | POA: Diagnosis not present

## 2017-04-15 DIAGNOSIS — J4540 Moderate persistent asthma, uncomplicated: Secondary | ICD-10-CM | POA: Diagnosis not present

## 2017-04-15 DIAGNOSIS — J60 Coalworker's pneumoconiosis: Secondary | ICD-10-CM | POA: Diagnosis not present

## 2017-04-19 ENCOUNTER — Other Ambulatory Visit: Payer: Self-pay | Admitting: Family Medicine

## 2017-05-15 DIAGNOSIS — J60 Coalworker's pneumoconiosis: Secondary | ICD-10-CM | POA: Diagnosis not present

## 2017-05-15 DIAGNOSIS — J4540 Moderate persistent asthma, uncomplicated: Secondary | ICD-10-CM | POA: Diagnosis not present

## 2017-05-17 ENCOUNTER — Other Ambulatory Visit: Payer: Self-pay | Admitting: Nurse Practitioner

## 2017-05-17 DIAGNOSIS — E034 Atrophy of thyroid (acquired): Secondary | ICD-10-CM

## 2017-05-18 ENCOUNTER — Other Ambulatory Visit: Payer: Self-pay | Admitting: Nurse Practitioner

## 2017-05-18 DIAGNOSIS — E034 Atrophy of thyroid (acquired): Secondary | ICD-10-CM

## 2017-05-27 ENCOUNTER — Other Ambulatory Visit: Payer: Self-pay | Admitting: Nurse Practitioner

## 2017-05-27 DIAGNOSIS — J4520 Mild intermittent asthma, uncomplicated: Secondary | ICD-10-CM

## 2017-06-15 DIAGNOSIS — J4540 Moderate persistent asthma, uncomplicated: Secondary | ICD-10-CM | POA: Diagnosis not present

## 2017-06-15 DIAGNOSIS — J60 Coalworker's pneumoconiosis: Secondary | ICD-10-CM | POA: Diagnosis not present

## 2017-06-20 DIAGNOSIS — C50911 Malignant neoplasm of unspecified site of right female breast: Secondary | ICD-10-CM | POA: Diagnosis not present

## 2017-06-26 ENCOUNTER — Other Ambulatory Visit: Payer: Self-pay

## 2017-06-26 DIAGNOSIS — J4520 Mild intermittent asthma, uncomplicated: Secondary | ICD-10-CM

## 2017-06-26 MED ORDER — CETIRIZINE HCL 10 MG PO TABS: 10.0000 mg | ORAL_TABLET | Freq: Every day | ORAL | 2 refills | Status: DC

## 2017-07-03 ENCOUNTER — Other Ambulatory Visit: Payer: Self-pay | Admitting: *Deleted

## 2017-07-03 DIAGNOSIS — J4520 Mild intermittent asthma, uncomplicated: Secondary | ICD-10-CM

## 2017-07-03 MED ORDER — HYDROXYZINE HCL 10 MG PO TABS: ORAL_TABLET | ORAL | 0 refills | Status: DC

## 2017-08-10 DIAGNOSIS — C50911 Malignant neoplasm of unspecified site of right female breast: Secondary | ICD-10-CM | POA: Diagnosis not present

## 2017-08-13 ENCOUNTER — Ambulatory Visit (INDEPENDENT_AMBULATORY_CARE_PROVIDER_SITE_OTHER): Payer: Medicare HMO | Admitting: Nurse Practitioner

## 2017-08-13 ENCOUNTER — Encounter: Payer: Self-pay | Admitting: Nurse Practitioner

## 2017-08-13 VITALS — BP 124/70 | HR 89 | Temp 96.6°F | Ht 61.0 in | Wt 100.8 lb

## 2017-08-13 DIAGNOSIS — R42 Dizziness and giddiness: Secondary | ICD-10-CM | POA: Diagnosis not present

## 2017-08-13 DIAGNOSIS — J4520 Mild intermittent asthma, uncomplicated: Secondary | ICD-10-CM

## 2017-08-13 DIAGNOSIS — M810 Age-related osteoporosis without current pathological fracture: Secondary | ICD-10-CM | POA: Diagnosis not present

## 2017-08-13 DIAGNOSIS — R296 Repeated falls: Secondary | ICD-10-CM | POA: Diagnosis not present

## 2017-08-13 DIAGNOSIS — E034 Atrophy of thyroid (acquired): Secondary | ICD-10-CM

## 2017-08-13 MED ORDER — CETIRIZINE HCL 10 MG PO TABS: 10.0000 mg | ORAL_TABLET | Freq: Every day | ORAL | 1 refills | Status: DC

## 2017-08-13 MED ORDER — MONTELUKAST SODIUM 10 MG PO TABS: 10.0000 mg | ORAL_TABLET | Freq: Every day | ORAL | 1 refills | Status: DC

## 2017-08-13 MED ORDER — LEVOTHYROXINE SODIUM 25 MCG PO TABS: 25.0000 ug | ORAL_TABLET | Freq: Every day | ORAL | 1 refills | Status: DC

## 2017-08-13 NOTE — Progress Notes (Signed)
Subjective:    Patient ID: Sarah Boyd, female    DOB: 1929-07-02, 84 y.o.   MRN: [id number]  HPI  ALEXXIA STANKIEWICZ is here today for follow up of chronic medical problem.  Outpatient Encounter Medications as of 08/13/2017  Medication Sig  . alendronate (FOSAMAX) 70 MG tablet Take 1 tablet (70 mg total) by mouth once a week. Take with a full glass of water on an empty stomach.  . Calcium Carbonate-Vitamin D (CALCIUM 600+D) 600-400 MG-UNIT tablet Take 1 tablet by mouth daily.  . cetirizine (ZYRTEC) 10 MG tablet Take 1 tablet (10 mg total) by mouth at bedtime.  . diclofenac (VOLTAREN) 50 MG EC tablet TAKE 1 TABLET BY MOUTH TWICE A DAY  . hydrOXYzine (ATARAX/VISTARIL) 10 MG tablet Take 1 tablet by mouth 1 hour before bed  . levothyroxine (SYNTHROID, LEVOTHROID) 25 MCG tablet TAKE 1 TABLET (25 MCG TOTAL) BY MOUTH DAILY BEFORE BREAKFAST.  . meloxicam (MOBIC) 7.5 MG tablet Take 1 tablet (7.5 mg total) by mouth daily.  . montelukast (SINGULAIR) 10 MG tablet Take 1 tablet (10 mg total) by mouth at bedtime.  . Multiple Vitamin (MULTIVITAMIN WITH MINERALS) TABS tablet Take 1 tablet by mouth daily.  . Soft Lens Products (SALINE SENSITIVE EYES) SOLN Place 1-2 drops into both eyes 2 (two) times daily as needed.  . triamcinolone cream (KENALOG) 0.1 % Apply 1 application topically 2 (two) times daily as needed. Apply to arms, back, legs, and hands as needed; do not apply to face or groin     1. Age-related osteoporosis without current pathological fracture  Patient is currently on fosamax. Her last dexa scan was 02/26/17. There was no change in T score from previous in 2014. Patient probably needs break from fosamax- consider prolia.  2. Frequent falls  Last time patient was seen she was having frequent falls. She had not broke anything but was a concern  3. Hypothyroidism due to acquired atrophy of thyroid  patient is not having any issues that she is aware of.  4. Mild intermittent chronic asthma  without complication  Her asthma is no worse or no better. She takes singulair and zyrtec daily.    New complaints: None today  Social history: Patient lives with her niece and her daughter. They do most everything for her. They do not let patient do to much because they are afraid she will fall.    Review of Systems  Constitutional: Negative for activity change and appetite change.  HENT: Negative.   Eyes: Negative for pain.  Respiratory: Negative for shortness of breath.   Cardiovascular: Negative for chest pain, palpitations and leg swelling.  Gastrointestinal: Negative for abdominal pain.  Endocrine: Negative for polydipsia.  Genitourinary: Negative.   Musculoskeletal: Positive for gait problem.  Skin: Negative for rash.  Neurological: Negative for dizziness, weakness and headaches.  Hematological: Does not bruise/bleed easily.  Psychiatric/Behavioral: Negative.   All other systems reviewed and are negative.      Objective:   Physical Exam  Constitutional: She is oriented to person, place, and time. She appears well-developed and well-nourished.  HENT:  Nose: Nose normal.  Mouth/Throat: Oropharynx is clear and moist.  Eyes: EOM are normal.  Neck: Trachea normal, normal range of motion and full passive range of motion without pain. Neck supple. No JVD present. Carotid bruit is not present. No thyromegaly present.  Cardiovascular: Normal rate, regular rhythm, normal heart sounds and intact distal pulses. Exam reveals no gallop and no friction rub.  No murmur heard. Pulmonary/Chest: Effort normal and breath sounds normal.  Abdominal: Soft. Bowel sounds are normal. She exhibits no distension and no mass. There is no tenderness.  Musculoskeletal: Normal range of motion.  Lymphadenopathy:    She has no cervical adenopathy.  Neurological: She is alert and oriented to person, place, and time. She has normal reflexes.  Skin: Skin is warm and dry.  Psychiatric: She has a  normal mood and affect. Her behavior is normal. Judgment and thought content normal.    BP 124/70   Pulse 89   Temp (!) 96.6 F (35.9 C) (Oral)   Ht '5\' 1"'  (1.549 m)   Wt 100 lb 12.8 oz (45.7 kg)   BMI 19.05 kg/m        Assessment & Plan:  1. Age-related osteoporosis without current pathological fracture Going to stop fosamax and change to prolia and see if will help better  2. Frequent falls Use walker when ever walking Take your time  3. Hypothyroidism due to acquired atrophy of thyroid Labs pending - levothyroxine (SYNTHROID, LEVOTHROID) 25 MCG tablet; Take 1 tablet (25 mcg total) by mouth daily before breakfast.  Dispense: 90 tablet; Refill: 1 - CMP14+EGFR - Thyroid Panel With TSH  4. Mild intermittent chronic asthma without complication - cetirizine (ZYRTEC) 10 MG tablet; Take 1 tablet (10 mg total) by mouth at bedtime.  Dispense: 90 tablet; Refill: 1 - montelukast (SINGULAIR) 10 MG tablet; Take 1 tablet (10 mg total) by mouth at bedtime.  Dispense: 90 tablet; Refill: 1    Labs pending Health maintenance reviewed Diet and exercise encouraged Continue all meds Follow up  In 3 months   [place], FNP

## 2017-08-13 NOTE — Addendum Note (Signed)
Addended by: Chevis Pretty on: 08/13/2017 12:31 PM   Modules accepted: Orders

## 2017-08-13 NOTE — Patient Instructions (Signed)

## 2017-08-14 DIAGNOSIS — L299 Pruritus, unspecified: Secondary | ICD-10-CM | POA: Diagnosis not present

## 2017-08-14 LAB — CMP14+EGFR
A/G RATIO: 1.6 (ref 1.2–2.2)
ALK PHOS: 71 IU/L (ref 39–117)
ALT: 13 IU/L (ref 0–32)
AST: 18 IU/L (ref 0–40)
Albumin: 4.4 g/dL (ref 3.5–4.7)
BUN/Creatinine Ratio: 14 (ref 12–28)
BUN: 12 mg/dL (ref 8–27)
Bilirubin Total: 0.3 mg/dL (ref 0.0–1.2)
CHLORIDE: 102 mmol/L (ref 96–106)
CO2: 26 mmol/L (ref 20–29)
Calcium: 9.5 mg/dL (ref 8.7–10.3)
Creatinine, Ser: 0.85 mg/dL (ref 0.57–1.00)
GFR calc Af Amer: 72 mL/min/{1.73_m2} (ref >59)
GFR calc non Af Amer: 63 mL/min/{1.73_m2} (ref >59)
GLOBULIN, TOTAL: 2.7 g/dL (ref 1.5–4.5)
Glucose: 75 mg/dL (ref 65–99)
POTASSIUM: 4.1 mmol/L (ref 3.5–5.2)
SODIUM: 143 mmol/L (ref 134–144)
Total Protein: 7.1 g/dL (ref 6.0–8.5)

## 2017-08-14 LAB — THYROID PANEL WITH TSH
Free Thyroxine Index: 1.5 (ref 1.2–4.9)
T3 Uptake Ratio: 23 % — ABNORMAL LOW (ref 24–39)
T4 TOTAL: 6.6 ug/dL (ref 4.5–12.0)
TSH: 2.16 u[IU]/mL (ref 0.450–4.500)

## 2017-08-15 ENCOUNTER — Other Ambulatory Visit: Payer: Self-pay | Admitting: Nurse Practitioner

## 2017-08-15 DIAGNOSIS — J4520 Mild intermittent asthma, uncomplicated: Secondary | ICD-10-CM

## 2017-08-21 DIAGNOSIS — Z7409 Other reduced mobility: Secondary | ICD-10-CM | POA: Diagnosis not present

## 2017-08-21 DIAGNOSIS — R2681 Unsteadiness on feet: Secondary | ICD-10-CM | POA: Diagnosis not present

## 2017-08-27 DIAGNOSIS — Z7409 Other reduced mobility: Secondary | ICD-10-CM | POA: Diagnosis not present

## 2017-08-27 DIAGNOSIS — R2681 Unsteadiness on feet: Secondary | ICD-10-CM | POA: Diagnosis not present

## 2017-08-31 DIAGNOSIS — Z7409 Other reduced mobility: Secondary | ICD-10-CM | POA: Diagnosis not present

## 2017-08-31 DIAGNOSIS — R2681 Unsteadiness on feet: Secondary | ICD-10-CM | POA: Diagnosis not present

## 2017-09-06 DIAGNOSIS — R2681 Unsteadiness on feet: Secondary | ICD-10-CM | POA: Diagnosis not present

## 2017-09-06 DIAGNOSIS — Z7409 Other reduced mobility: Secondary | ICD-10-CM | POA: Diagnosis not present

## 2017-09-10 DIAGNOSIS — Z7409 Other reduced mobility: Secondary | ICD-10-CM | POA: Diagnosis not present

## 2017-09-10 DIAGNOSIS — R2681 Unsteadiness on feet: Secondary | ICD-10-CM | POA: Diagnosis not present

## 2017-09-18 DIAGNOSIS — R2681 Unsteadiness on feet: Secondary | ICD-10-CM | POA: Diagnosis not present

## 2017-09-18 DIAGNOSIS — Z7409 Other reduced mobility: Secondary | ICD-10-CM | POA: Diagnosis not present

## 2017-10-15 DIAGNOSIS — J60 Coalworker's pneumoconiosis: Secondary | ICD-10-CM | POA: Diagnosis not present

## 2017-10-15 DIAGNOSIS — R1310 Dysphagia, unspecified: Secondary | ICD-10-CM | POA: Diagnosis not present

## 2017-10-15 DIAGNOSIS — J4540 Moderate persistent asthma, uncomplicated: Secondary | ICD-10-CM | POA: Diagnosis not present

## 2017-10-16 ENCOUNTER — Telehealth: Payer: Self-pay | Admitting: Nurse Practitioner

## 2017-10-16 ENCOUNTER — Encounter: Payer: Self-pay | Admitting: *Deleted

## 2017-10-16 NOTE — Telephone Encounter (Signed)
Left message that summary of benefits received and patient would not have to pay anything out of pocket for Prolia. Left this information on her voicemail and requested that she call back to schedule an appointment for injection next week if she is interested. See Osteoporosis Documentation encounter for further information.

## 2017-10-16 NOTE — Progress Notes (Unsigned)
Summary of benefits received. Patient will owe nothing out of pocket if bought and billed through the office. Left message on daughter's voicemail that she may call and schedule an appointment for an injection next week if she wants to start on Prolia. We have ordered a few for the office and they should be here in the next day or so.

## 2017-10-22 ENCOUNTER — Other Ambulatory Visit: Payer: Self-pay | Admitting: Nurse Practitioner

## 2017-10-22 DIAGNOSIS — M810 Age-related osteoporosis without current pathological fracture: Secondary | ICD-10-CM

## 2017-10-26 ENCOUNTER — Ambulatory Visit (INDEPENDENT_AMBULATORY_CARE_PROVIDER_SITE_OTHER): Payer: Medicare HMO | Admitting: *Deleted

## 2017-10-26 DIAGNOSIS — M810 Age-related osteoporosis without current pathological fracture: Secondary | ICD-10-CM

## 2017-10-26 MED ORDER — DENOSUMAB 60 MG/ML ~~LOC~~ SOSY
60.0000 mg | PREFILLED_SYRINGE | Freq: Once | SUBCUTANEOUS | Status: AC
  Administered 2017-10-26: 60 mg via SUBCUTANEOUS

## 2017-10-26 NOTE — Progress Notes (Signed)
Pt here for Prolia inj Pt is not having any dental problems or leg pain Prolia inj given Pt tolerated well Pt is buy and bill Last calcium level 9.5 on 08/13/2017 Last Dexa 02/26/2017 Next depo due 04/29/2018

## 2017-11-15 DIAGNOSIS — J60 Coalworker's pneumoconiosis: Secondary | ICD-10-CM | POA: Diagnosis not present

## 2017-11-15 DIAGNOSIS — J4540 Moderate persistent asthma, uncomplicated: Secondary | ICD-10-CM | POA: Diagnosis not present

## 2017-11-22 ENCOUNTER — Ambulatory Visit: Payer: Medicare HMO | Admitting: Nurse Practitioner

## 2017-11-23 ENCOUNTER — Ambulatory Visit: Payer: Medicare HMO | Admitting: Nurse Practitioner

## 2017-12-11 ENCOUNTER — Ambulatory Visit (INDEPENDENT_AMBULATORY_CARE_PROVIDER_SITE_OTHER): Payer: Medicare HMO | Admitting: Nurse Practitioner

## 2017-12-11 ENCOUNTER — Encounter: Payer: Self-pay | Admitting: Nurse Practitioner

## 2017-12-11 VITALS — BP 125/69 | HR 77 | Temp 97.0°F

## 2017-12-11 DIAGNOSIS — M810 Age-related osteoporosis without current pathological fracture: Secondary | ICD-10-CM | POA: Diagnosis not present

## 2017-12-11 DIAGNOSIS — E034 Atrophy of thyroid (acquired): Secondary | ICD-10-CM

## 2017-12-11 DIAGNOSIS — R296 Repeated falls: Secondary | ICD-10-CM | POA: Diagnosis not present

## 2017-12-11 DIAGNOSIS — J4520 Mild intermittent asthma, uncomplicated: Secondary | ICD-10-CM

## 2017-12-11 NOTE — Progress Notes (Signed)
Subjective:    Patient ID: Sarah Boyd, female    DOB: 01/31/1930, 84 y.o.   MRN: [id number]   Chief Complaint: Medical management of chronic issues  HPI:  1. Hypothyroidism due to acquired atrophy of thyroid  Not having any problems that sh is aware of. Takes her medicines every day  2. Age-related osteoporosis without current pathological fracture  Last dexascan was done on 02/26/17 with t score of -3.4. She is taking her prolia every 6 months. Does very little exercise because she is very unsteady on her feet  3. Mild intermittent chronic asthma without complication  Uses her advair everyday. Does ok as long as she is not outside for long periods of time.  4. Frequent falls  She has fallen 2x in the last 3 weeks. She uses her walker but still manages to fall. She has had physical therapy which did not really help. She has never broke anything.    Outpatient Encounter Medications as of 12/11/2017  Medication Sig  . ADVAIR DISKUS 100-50 MCG/DOSE AEPB INHALE 1 PUFF INTO THE LUNGS 2 (TWO) TIMES DAILY.  Marland Kitchen alendronate (FOSAMAX) 70 MG tablet TAKE 1 TABLET ONCE A WEEK WITH A FULL GLASS OF WATER ON AN EMPTY STOMACH  . Calcium Carbonate-Vitamin D (CALCIUM 600+D) 600-400 MG-UNIT tablet Take 1 tablet by mouth daily.  . cetirizine (ZYRTEC) 10 MG tablet Take 1 tablet (10 mg total) by mouth at bedtime.  . hydrOXYzine (ATARAX/VISTARIL) 10 MG tablet Take 1 tablet by mouth 1 hour before bed  . levothyroxine (SYNTHROID, LEVOTHROID) 25 MCG tablet Take 1 tablet (25 mcg total) by mouth daily before breakfast.  . meloxicam (MOBIC) 7.5 MG tablet Take 1 tablet (7.5 mg total) by mouth daily.  . montelukast (SINGULAIR) 10 MG tablet Take 1 tablet (10 mg total) by mouth at bedtime.  . Multiple Vitamin (MULTIVITAMIN WITH MINERALS) TABS tablet Take 1 tablet by mouth daily.  . Soft Lens Products (SALINE SENSITIVE EYES) SOLN Place 1-2 drops into both eyes 2 (two) times daily as needed.  . triamcinolone cream  (KENALOG) 0.1 % Apply 1 application topically 2 (two) times daily as needed. Apply to arms, back, legs, and hands as needed; do not apply to face or groin      New complaints: None today  Social history: Daughter lives with her.   Review of Systems  Constitutional: Negative for activity change and appetite change.  HENT: Negative.   Eyes: Negative for pain.  Respiratory: Negative for shortness of breath.   Cardiovascular: Negative for chest pain, palpitations and leg swelling.  Gastrointestinal: Negative for abdominal pain.  Endocrine: Negative for polydipsia.  Genitourinary: Negative.   Skin: Negative for rash.  Neurological: Negative for dizziness, weakness and headaches.  Hematological: Does not bruise/bleed easily.  Psychiatric/Behavioral: Negative.   All other systems reviewed and are negative.      Objective:   Physical Exam  Constitutional: She is oriented to person, place, and time. She appears well-developed and well-nourished.  HENT:  Head: Normocephalic.  Nose: Nose normal.  Mouth/Throat: Oropharynx is clear and moist.  Eyes: Pupils are equal, round, and reactive to light. EOM are normal.  Neck: Normal range of motion. Neck supple. No JVD present. Carotid bruit is not present.  Cardiovascular: Normal rate, regular rhythm, normal heart sounds and intact distal pulses.  Pulmonary/Chest: Effort normal and breath sounds normal. No respiratory distress. She has no wheezes. She has no rales. She exhibits no tenderness.  Abdominal: Soft. Normal appearance, normal  aorta and bowel sounds are normal. She exhibits no distension, no abdominal bruit, no pulsatile midline mass and no mass. There is no splenomegaly or hepatomegaly. There is no tenderness.  Musculoskeletal: Normal range of motion. She exhibits no edema.  Lymphadenopathy:    She has no cervical adenopathy.  Neurological: She is alert and oriented to person, place, and time. She has normal reflexes.  Skin: Skin  is warm and dry.  Psychiatric: She has a normal mood and affect. Her behavior is normal. Judgment and thought content normal.  Nursing note and vitals reviewed.  BP 125/69   Pulse 77   Temp (!) 97 F (36.1 C) (Oral)         Assessment & Plan:  Sarah Boyd comes in today with chief complaint of Medical Management of Chronic Issues   Diagnosis and orders addressed:  1. Hypothyroidism due to acquired atrophy of thyroid Labs pending - CMP14+EGFR - Thyroid Panel With TSH  2. Age-related osteoporosis without current pathological fracture Continue prolia as rx  3. Mild intermittent chronic asthma without complication Continue advair Avoid going outside when it is really hot  4. Frequent falls Take your time when walking C\do not walk without walker   Labs pending Health Maintenance reviewed Diet and exercise encouraged  Follow up plan: 3 months   Mary-Margaret Hassell Done, FNP

## 2017-12-11 NOTE — Patient Instructions (Signed)

## 2017-12-12 LAB — CMP14+EGFR
A/G RATIO: 1.7 (ref 1.2–2.2)
ALBUMIN: 4.3 g/dL (ref 3.5–4.7)
ALK PHOS: 60 IU/L (ref 39–117)
ALT: 11 IU/L (ref 0–32)
AST: 19 IU/L (ref 0–40)
BUN / CREAT RATIO: 18 (ref 12–28)
BUN: 15 mg/dL (ref 8–27)
Bilirubin Total: 0.3 mg/dL (ref 0.0–1.2)
CO2: 24 mmol/L (ref 20–29)
Calcium: 9 mg/dL (ref 8.7–10.3)
Chloride: 106 mmol/L (ref 96–106)
Creatinine, Ser: 0.82 mg/dL (ref 0.57–1.00)
GFR calc Af Amer: 75 mL/min/{1.73_m2} (ref >59)
GFR calc non Af Amer: 65 mL/min/{1.73_m2} (ref >59)
GLOBULIN, TOTAL: 2.5 g/dL (ref 1.5–4.5)
GLUCOSE: 74 mg/dL (ref 65–99)
POTASSIUM: 4.3 mmol/L (ref 3.5–5.2)
SODIUM: 142 mmol/L (ref 134–144)
Total Protein: 6.8 g/dL (ref 6.0–8.5)

## 2017-12-12 LAB — THYROID PANEL WITH TSH
Free Thyroxine Index: 1.8 (ref 1.2–4.9)
T3 Uptake Ratio: 26 % (ref 24–39)
T4, Total: 6.9 ug/dL (ref 4.5–12.0)
TSH: 2.05 u[IU]/mL (ref 0.450–4.500)

## 2017-12-27 ENCOUNTER — Ambulatory Visit (INDEPENDENT_AMBULATORY_CARE_PROVIDER_SITE_OTHER): Payer: Medicare HMO | Admitting: *Deleted

## 2017-12-27 VITALS — BP 108/58 | HR 93 | Temp 97.1°F | Ht 61.0 in | Wt 98.0 lb

## 2017-12-27 DIAGNOSIS — Z0000 Encounter for general adult medical examination without abnormal findings: Secondary | ICD-10-CM | POA: Diagnosis not present

## 2017-12-27 NOTE — Patient Instructions (Signed)
  Sarah Boyd , Thank you for taking time to come for your Medicare Wellness Visit. I appreciate your ongoing commitment to your health goals. Please review the following plan we discussed and let me know if I can assist you in the future.   These are the goals we discussed: Goals    . Gain weight     98 lbs today, would like to be over 100 lb     . Increase physical activity     Return back to [facility] de[facility] for activities    . Prevent falls       This is a list of the screening recommended for you and due dates:  Health Maintenance  Topic Date Due  . Flu Shot  03/20/2018*  . Mammogram  03/29/2018  . Tetanus Vaccine  12/07/2026  . DEXA scan (bone density measurement)  Completed  . Pneumonia vaccines  Completed  *Topic was postponed. The date shown is not the original due date.    Keep follow up with Ronnald Collum, FNP  Stay active and try to prevent falls.

## 2017-12-27 NOTE — Progress Notes (Addendum)
Subjective:   Sarah Boyd is a 84 y.o. female who presents for Medicare Annual (Subsequent) preventive examination. She is retired from being a Programmer, systems. She enjoys crosswork puzzles and watching the soaps on TV. She does several therapy exercises and walks daily when the weather is good. She states that she eats a healthy diet and gets in 3 meals a day. She is active in her church. She lives at her nieces home with her niece, her husband and her daughter. She has never married and never had any children. Her niece does a have a dog that lives in the house. Fall risks and hazards were discussed today. She states that her health is about the same as it was a year ago.   Cardiac Risk Factors include: advanced age (>20men, >52 women)     Objective:     Vitals: BP (!) 108/58 (BP Location: Left Arm)   Pulse 93   Temp (!) 97.1 F (36.2 C) (Oral)   Ht 5\' 1"  (1.549 m)   Wt 98 lb (44.5 kg)   BMI 18.52 kg/m   Body mass index is 18.52 kg/m.  Advanced Directives 12/27/2017 01/03/2017 12/06/2016 04/11/2016 03/05/2016 08/27/2015 11/27/2014  Does Patient Have a Medical Advance Directive? No No No Yes No No No  Does patient want to make changes to medical advance directive? - - - Yes - information given - - -  Copy of [place] in Chart? - - - - - No - copy requested -  Would patient like information on creating a medical advance directive? Yes (MAU/Ambulatory/Procedural Areas - Information given) - Yes (MAU/Ambulatory/Procedural Areas - Information given) - No - patient declined information - Yes - Educational materials given  Pre-existing out of facility DNR order (yellow form or pink MOST form) - - - - - - -    Tobacco Social History   Tobacco Use  Smoking Status Never Smoker  Smokeless Tobacco Never Used     Counseling given: Not Answered   Past Medical History:  Diagnosis Date  . Allergy   . Asthma   . Cancer ([place])    Breast CA, Rt Mastectomy  .  History of breast cancer S/P RIGHT MASTECTOMY AND CHEMO 21 YRS AGO (APPROX.  1990)   NO RECURRENCE  . HOH (hard of hearing) NO AIDS  . Hypothyroidism   . Labral tear of shoulder RIGHT SHOULDER  . Osteoporosis   . Right shoulder pain    Past Surgical History:  Procedure Laterality Date  . CATARACT EXTRACTION, BILATERAL    . EXCISION OF RIGHT CHEST MASS  12-19-1999   BENIGN  . MASTECTOMY  1990  (APPROX.)   RIGHT BREAST CANCER -- NO RECURRENCE  . ORIF BILATERAL WRIST  2009  . SHOULDER ARTHROSCOPY  12/06/2011   Procedure: ARTHROSCOPY SHOULDER;  Surgeon: Magnus Sinning, MD;  Location: [facility];  Service: Orthopedics;  Laterality: Right;  WITH LABRIAL DEBRIDEMENT AND SAD INTERSCALINE BLOCK   Family History  Problem Relation Age of Onset  . Stroke Mother   . Rheumatic fever Father   . Arthritis Sister        back and knees  . Cancer Brother        prostate  . Arthritis Sister        back and knees // rheumatoid  . Breast cancer Sister   . Cancer Sister        esophogeal  . Cancer Sister  lymphoma - non [place]odge.  . Dementia Sister   . Diabetes Sister   . Dementia Sister   . Arthritis Sister        hips  . Dementia Sister   . Dementia Sister    Social History   Socioeconomic History  . Marital status: Single    Spouse name: Not on file  . Number of children: 0  . Years of education: College  . Highest education level: Not on file  Occupational History  . 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 Financial resource strain: Not on file  . Food insecurity:    Worry: Not on file    Inability: Not on file  . Transportation needs:    Medical: Not on file    Non-medical: Not on file  Tobacco Use  . Smoking status: Never Smoker  . Smokeless tobacco: Never Used  Substance and Sexual Activity  . Alcohol use: No  . Drug use: No  . Sexual activity: Never  Lifestyle  . Physical activity:    Days per  week: Not on file    Minutes per session: Not on file  . Stress: Not on file  Relationships  . Social connections:    Talks on phone: Not on file    Gets together: Not on file    Attends religious service: Not on file    Active member of club or organization: Not on file    Attends meetings of clubs or organizations: Not on file    Relationship status: Not on file  Other Topics Concern  . Not on file  Social History Narrative   Patient lives at home with niece, her husband and her daughter.   Never married and no children     Outpatient Encounter Medications as of 12/27/2017  Medication Sig  . Calcium Carbonate-Vitamin D (CALCIUM 600+D) 600-400 MG-UNIT tablet Take 1 tablet by mouth daily.  . cetirizine (ZYRTEC) 10 MG tablet Take 1 tablet (10 mg total) by mouth at bedtime.  . hydrOXYzine (ATARAX/VISTARIL) 10 MG tablet Take 1 tablet by mouth 1 hour before bed  . levothyroxine (SYNTHROID, LEVOTHROID) 25 MCG tablet Take 1 tablet (25 mcg total) by mouth daily before breakfast.  . montelukast (SINGULAIR) 10 MG tablet Take 1 tablet (10 mg total) by mouth at bedtime.  . Multiple Vitamin (MULTIVITAMIN WITH MINERALS) TABS tablet Take 1 tablet by mouth daily.  . Soft Lens Products (SALINE SENSITIVE EYES) SOLN Place 1-2 drops into both eyes 2 (two) times daily as needed.  . triamcinolone cream (KENALOG) 0.1 % Apply 1 application topically 2 (two) times daily as needed. Apply to arms, back, legs, and hands as needed; do not apply to face or groin   No facility-administered encounter medications on file as of 12/27/2017.     Activities of Daily Living In your present state of health, do you have any difficulty performing the following activities: 12/27/2017  Hearing? Y  Comment hearing aid - right ear  Vision? Y  Comment wears rx glasses   Difficulty concentrating or making decisions? Y  Comment sometimes  Walking or climbing stairs? Y  Comment walks with a walker - unable to do steps    Dressing or bathing? N  Preparing Food and eating ? Y  Using the Toilet? N  In the past six months, have you accidently leaked urine? N  Do you have problems with loss of bowel control?  N  Managing your Medications? N  Managing your Finances? Y  Comment neice helps  Housekeeping or managing your Housekeeping? N  Some recent data might be hidden    Patient Care Team: Chevis Pretty, FNP as PCP - General (Family Medicine) Sandford Craze, MD as Referring Physician (Dermatology) Star Age, MD as Attending Physician (Neurology) Shon Hough, MD as Consulting Physician (Ophthalmology)    Assessment:   This is a routine wellness examination for Despina.  Exercise Activities and Dietary recommendations Current Exercise Habits: Home exercise routine, Type of exercise: walking;Other - see comments(therapy ), Time (Minutes): 30, Frequency (Times/Week): 5, Weekly Exercise (Minutes/Week): 150, Intensity: Mild, Exercise limited by: orthopedic condition(s)  Goals    . Gain weight     98 lbs today, would like to be over 100 lb     . Increase physical activity     Return back to [facility] dept for activities    . Prevent falls       Fall Risk Fall Risk  12/27/2017 12/11/2017 08/13/2017 02/13/2017 12/06/2016  Falls in the past year? Yes Yes Yes No Yes  Number falls in past yr: 2 or more 1 1 - 2 or more  Injury with Fall? No No (No Data) - No  Comment - - Bruising. - -  Risk Factor Category  - - - - High Fall Risk  Risk for fall due to : - History of fall(s);Impaired balance/gait;Impaired mobility - - Impaired balance/gait;History of fall(s)  Risk for fall due to: Comment - - - - vertigo  Follow up - Falls evaluation completed;Education provided;Falls prevention discussed - - Falls prevention discussed   Is the patient's home free of loose throw rugs in walkways, pet beds, electrical cords, etc?  Fall risk and hazards were discussed today at length.  Depression Screen PHQ 2/9  Scores 12/27/2017 12/11/2017 08/13/2017 02/13/2017  PHQ - 2 Score 0 0 1 0     Cognitive Function MMSE - Mini Mental State Exam 12/27/2017 12/06/2016  Orientation to time 5 5  Orientation to Place 5 4  Registration 3 3  Attention/ Calculation 5 3  Recall 2 0  Language- name 2 objects 2 0  Language- repeat 1 1  Language- follow 3 step command 2 3  Language- read & follow direction 1 1  Write a sentence 1 1  Copy design 1 0  Total score 28 21        Immunization History  Administered Date(s) Administered  . Influenza, High Dose Seasonal PF 05/08/2016  . Influenza,inj,Quad PF,6+ Mos 04/28/2013, 04/09/2014, 04/22/2015  . Pneumococcal Conjugate-13 04/22/2015  . Pneumococcal Polysaccharide-23 01/22/2014  . Tdap 12/06/2016    Qualifies for Shingles Vaccine?declined  Screening Tests Health Maintenance  Topic Date Due  . INFLUENZA VACCINE  03/20/2018 (Originally 01/10/2018)  . MAMMOGRAM  03/29/2018  . TETANUS/TDAP  12/07/2026  . DEXA SCAN  Completed  . PNA vac Low Risk Adult  Completed    Cancer Screenings: Lung: Low Dose CT Chest recommended if Age 82-80 years, 30 pack-year currently smoking OR have quit w/in 15years. Patient does qualify. Breast:  Up to date on Mammogram? Yes   Up to date of Bone Density/Dexa? Yes Colorectal: due at next ov  Additional Screenings: declined Hepatitis C Screening:      Plan:   pt is to keep follow up with Ronnald Collum, FNP and other specialist She is to be careful not to fall and to stay active. It is optional for her to get a  CXR and EKG at next OV, if MMM wishes to do so.  She is up to date on other health maintenance.   I have personally reviewed and noted the following in the patient's chart:   . Medical and social history . Use of alcohol, tobacco or illicit drugs  . Current medications and supplements . Functional ability and status . Nutritional status . Physical activity . Advanced directives . List of other  physicians . Hospitalizations, surgeries, and ER visits in previous 12 months . Vitals . Screenings to include cognitive, depression, and falls . Referrals and appointments  In addition, I have reviewed and discussed with patient certain preventive protocols, quality metrics, and best practice recommendations. A written personalized care plan for preventive services as well as general preventive health recommendations were provided to patient.     Puneet Masoner, Cameron Proud, LPN  1/77/1165   I have reviewed and agree with the above AWV documentation.   Evelina Dun, FNP

## 2018-01-07 ENCOUNTER — Telehealth: Payer: Self-pay | Admitting: *Deleted

## 2018-01-07 NOTE — Telephone Encounter (Signed)
Spoke with pt's caregiver and she states a picture fell on pt's head while she was sleeping the other night and now she has bruising down into her face (cheekbones, eye area) and it is swollen. Pt denies LOC, headaches, dizziness and visual disturbances. Does pt ntbs here? Please advise.

## 2018-01-07 NOTE — Telephone Encounter (Signed)
Just put ice on it. If she becomes nauseated, talking out of head or c/o headache then will need to go to ER

## 2018-01-07 NOTE — Telephone Encounter (Signed)
Pt caregiver  aware of provider feedback and voiced understanding.

## 2018-01-15 DIAGNOSIS — J4540 Moderate persistent asthma, uncomplicated: Secondary | ICD-10-CM | POA: Diagnosis not present

## 2018-01-15 DIAGNOSIS — J60 Coalworker's pneumoconiosis: Secondary | ICD-10-CM | POA: Diagnosis not present

## 2018-01-23 ENCOUNTER — Other Ambulatory Visit: Payer: Self-pay | Admitting: Nurse Practitioner

## 2018-01-23 DIAGNOSIS — E034 Atrophy of thyroid (acquired): Secondary | ICD-10-CM

## 2018-01-25 ENCOUNTER — Other Ambulatory Visit: Payer: Self-pay | Admitting: Nurse Practitioner

## 2018-01-25 DIAGNOSIS — E034 Atrophy of thyroid (acquired): Secondary | ICD-10-CM

## 2018-02-15 DIAGNOSIS — J4540 Moderate persistent asthma, uncomplicated: Secondary | ICD-10-CM | POA: Diagnosis not present

## 2018-02-15 DIAGNOSIS — J60 Coalworker's pneumoconiosis: Secondary | ICD-10-CM | POA: Diagnosis not present

## 2018-02-26 ENCOUNTER — Encounter (HOSPITAL_COMMUNITY): Payer: Self-pay | Admitting: Emergency Medicine

## 2018-02-26 ENCOUNTER — Emergency Department (HOSPITAL_COMMUNITY): Payer: Medicare HMO

## 2018-02-26 ENCOUNTER — Other Ambulatory Visit: Payer: Self-pay

## 2018-02-26 ENCOUNTER — Emergency Department (HOSPITAL_COMMUNITY)
Admission: EM | Admit: 2018-02-26 | Discharge: 2018-02-26 | Disposition: A | Discharge status: Home / Self Care | Payer: Medicare HMO | Attending: Emergency Medicine | Admitting: Emergency Medicine

## 2018-02-26 DIAGNOSIS — S62615A Displaced fracture of proximal phalanx of left ring finger, initial encounter for closed fracture: Secondary | ICD-10-CM | POA: Diagnosis not present

## 2018-02-26 DIAGNOSIS — J45909 Unspecified asthma, uncomplicated: Secondary | ICD-10-CM | POA: Insufficient documentation

## 2018-02-26 DIAGNOSIS — S32511A Fracture of superior rim of right pubis, initial encounter for closed fracture: Secondary | ICD-10-CM | POA: Diagnosis not present

## 2018-02-26 DIAGNOSIS — M79604 Pain in right leg: Secondary | ICD-10-CM | POA: Diagnosis not present

## 2018-02-26 DIAGNOSIS — Y929 Unspecified place or not applicable: Secondary | ICD-10-CM | POA: Insufficient documentation

## 2018-02-26 DIAGNOSIS — S32591A Other specified fracture of right pubis, initial encounter for closed fracture: Secondary | ICD-10-CM

## 2018-02-26 DIAGNOSIS — R42 Dizziness and giddiness: Secondary | ICD-10-CM | POA: Diagnosis not present

## 2018-02-26 DIAGNOSIS — E039 Hypothyroidism, unspecified: Secondary | ICD-10-CM | POA: Diagnosis not present

## 2018-02-26 DIAGNOSIS — R2681 Unsteadiness on feet: Secondary | ICD-10-CM | POA: Insufficient documentation

## 2018-02-26 DIAGNOSIS — Y939 Activity, unspecified: Secondary | ICD-10-CM | POA: Insufficient documentation

## 2018-02-26 DIAGNOSIS — Y999 Unspecified external cause status: Secondary | ICD-10-CM | POA: Diagnosis not present

## 2018-02-26 DIAGNOSIS — R296 Repeated falls: Secondary | ICD-10-CM | POA: Insufficient documentation

## 2018-02-26 DIAGNOSIS — W19XXXA Unspecified fall, initial encounter: Secondary | ICD-10-CM | POA: Diagnosis not present

## 2018-02-26 DIAGNOSIS — S0003XA Contusion of scalp, initial encounter: Secondary | ICD-10-CM | POA: Insufficient documentation

## 2018-02-26 DIAGNOSIS — Z79899 Other long term (current) drug therapy: Secondary | ICD-10-CM | POA: Insufficient documentation

## 2018-02-26 DIAGNOSIS — R269 Unspecified abnormalities of gait and mobility: Secondary | ICD-10-CM | POA: Diagnosis not present

## 2018-02-26 DIAGNOSIS — Z853 Personal history of malignant neoplasm of breast: Secondary | ICD-10-CM | POA: Insufficient documentation

## 2018-02-26 DIAGNOSIS — M6281 Muscle weakness (generalized): Secondary | ICD-10-CM | POA: Diagnosis not present

## 2018-02-26 DIAGNOSIS — S6992XA Unspecified injury of left wrist, hand and finger(s), initial encounter: Secondary | ICD-10-CM | POA: Diagnosis present

## 2018-02-26 DIAGNOSIS — R0902 Hypoxemia: Secondary | ICD-10-CM | POA: Diagnosis not present

## 2018-02-26 DIAGNOSIS — S72001A Fracture of unspecified part of neck of right femur, initial encounter for closed fracture: Secondary | ICD-10-CM | POA: Diagnosis not present

## 2018-02-26 DIAGNOSIS — W1830XA Fall on same level, unspecified, initial encounter: Secondary | ICD-10-CM | POA: Diagnosis not present

## 2018-02-26 NOTE — ED Notes (Signed)
Patient and family refused to be admitted. They state that they have enough help at home for patient to stay at home for care.  Patient and family also state that they are unwilling to wait for bedside commode to be delivered to patient's room.

## 2018-02-26 NOTE — Discharge Instructions (Addendum)
Follow-up with your doctor if you need further home health.  Follow-up with Ortho for the finger and pelvic injuries.

## 2018-02-26 NOTE — Clinical Social Work Note (Signed)
LCSW spoke with patient's niece, Fleta Scales. Patient lives with her niece. She is patient's aid. She stated that patient has experienced increased falls recently. She indicated that she was not currently interested in placement at this time. She identified patient's need as a bedside commode. Ms. Scales stated "we can take care of her."  Case management placed the requested that RN have doctor place order for bedside commode and case management notified [facility] of the order.    LCSW signing off.

## 2018-02-26 NOTE — ED Provider Notes (Signed)
[facility] EMERGENCY DEPARTMENT Provider Note   CSN: [id number] Arrival date & time: 02/26/18  1158     History   Chief Complaint Chief Complaint  Patient presents with  . Fall    HPI Sarah Boyd is a 84 y.o. female.  HPI Patient presents after a fall.  Has some vertigo and unsteadiness at baseline.  States she fell earlier today.  Complaining of pain in her left ring finger and right hip.  Also has a bump on her head.  Denies loss conscious and she is not on anticoagulation.  No neck pain.  No chest or abdominal pain.  Has been able to ambulate.  Does have a history of somewhat frequent falls. Past Medical History:  Diagnosis Date  . Allergy   . Asthma   . Cancer ([place])    Breast CA, Rt Mastectomy  . History of breast cancer S/P RIGHT MASTECTOMY AND CHEMO 21 YRS AGO (APPROX.  1990)   NO RECURRENCE  . HOH (hard of hearing) NO AIDS  . Hypothyroidism   . Labral tear of shoulder RIGHT SHOULDER  . Osteoporosis   . Right shoulder pain     Patient Active Problem List   Diagnosis Date Noted  . Frequent falls 04/15/2015  . Asthma, chronic 12/05/2012  . Osteoporosis 12/05/2012  . Hypothyroidism 12/05/2012    Past Surgical History:  Procedure Laterality Date  . CATARACT EXTRACTION, BILATERAL    . EXCISION OF RIGHT CHEST MASS  12-19-1999   BENIGN  . MASTECTOMY  1990  (APPROX.)   RIGHT BREAST CANCER -- NO RECURRENCE  . ORIF BILATERAL WRIST  2009  . SHOULDER ARTHROSCOPY  12/06/2011   Procedure: ARTHROSCOPY SHOULDER;  Surgeon: Magnus Sinning, MD;  Location: [facility];  Service: Orthopedics;  Laterality: Right;  WITH LABRIAL DEBRIDEMENT AND SAD INTERSCALINE BLOCK     OB History    Gravida      Para      Term      Preterm      AB      Living  0     SAB      TAB      Ectopic      Multiple      Live Births               Home Medications    Prior to Admission medications   Medication Sig Start Date End Date Taking?  Authorizing Provider  Calcium Carbonate-Vitamin D (CALCIUM 600+D) 600-400 MG-UNIT tablet Take 1 tablet by mouth daily.   Yes [provider]  cetirizine (ZYRTEC) 10 MG tablet Take 1 tablet (10 mg total) by mouth at bedtime. 08/13/17  Yes Hassell Done, Mary-Margaret, FNP  hydrOXYzine (ATARAX/VISTARIL) 10 MG tablet Take 1 tablet by mouth 1 hour before bed 07/03/17  Yes Martin, Mary-Margaret, FNP  levothyroxine (SYNTHROID, LEVOTHROID) 25 MCG tablet TAKE 1 TABLET (25 MCG TOTAL) BY MOUTH DAILY BEFORE BREAKFAST. 01/25/18  Yes Martin, Mary-Margaret, FNP  montelukast (SINGULAIR) 10 MG tablet Take 1 tablet (10 mg total) by mouth at bedtime. 08/13/17  Yes Martin, Mary-Margaret, FNP  Multiple Vitamin (MULTIVITAMIN WITH MINERALS) TABS tablet Take 1 tablet by mouth daily.   Yes [provider]  Soft Lens Products (SALINE SENSITIVE EYES) SOLN Place 1-2 drops into both eyes 2 (two) times daily as needed.   Yes [provider]  triamcinolone cream (KENALOG) 0.1 % Apply 1 application topically 2 (two) times daily as needed. Apply to arms, back,  legs, and hands as needed; do not apply to face or groin 11/22/16  Yes [provider]    Family History Family History  Problem Relation Age of Onset  . Stroke Mother   . Rheumatic fever Father   . Arthritis Sister        back and knees  . Cancer Brother        prostate  . Arthritis Sister        back and knees // rheumatoid  . Breast cancer Sister   . Cancer Sister        esophogeal  . Cancer Sister        lymphoma - non [place].  . Dementia Sister   . Diabetes Sister   . Dementia Sister   . Arthritis Sister        hips  . Dementia Sister   . Dementia Sister     Social History Social History   Tobacco Use  . Smoking status: Never Smoker  . Smokeless tobacco: Never Used  Substance Use Topics  . Alcohol use: No  . Drug use: No     Allergies   Chocolate and Benadryl [diphenhydramine hcl]   Review of Systems Review of  Systems  Constitutional: Negative for appetite change.  Respiratory: Negative for shortness of breath.   Cardiovascular: Negative for chest pain.  Gastrointestinal: Negative for abdominal pain.  Genitourinary: Negative for flank pain.  Musculoskeletal: Negative for back pain.  Skin: Negative for wound.  Neurological: Positive for dizziness. Negative for headaches.  Psychiatric/Behavioral: Negative for confusion.     Physical Exam Updated Vital Signs BP 132/66 (BP Location: Left Arm)   Pulse 88   Temp 98.2 F (36.8 C) (Oral)   Resp 16   Ht 5\' 1"  (1.549 m)   Wt 44.9 kg   SpO2 93%   BMI 18.71 kg/m   Physical Exam  Constitutional: She appears well-developed.  HENT:  Hematoma to right occipital area.  No underlying bony tenderness.  Size is approximately 2 cm.  Eyes: Pupils are equal, round, and reactive to light.  Neck: Neck supple.  Cardiovascular: Normal rate.  Pulmonary/Chest: Effort normal.  Abdominal: There is no tenderness.  Musculoskeletal: She exhibits tenderness.  Tenderness with some ecchymosis over proximal left ring finger.  Skin intact.  No tenderness wrist elbow or shoulder.  Tenderness over right hip laterally.  Somewhat decreased range of motion due to pain.  No tenderness over pelvis.  Neurological: She is alert.  Skin: Skin is warm. Capillary refill takes less than 2 seconds.  Psychiatric: She has a normal mood and affect.     ED Treatments / Results  Labs (all labs ordered are listed, but only abnormal results are displayed) Labs Reviewed - No data to display  EKG None  Radiology Dg Finger Ring Left  Result Date: 02/26/2018 CLINICAL DATA:  Finger bruising after tripping and falling. EXAM: LEFT RING FINGER 2+V COMPARISON:  None. FINDINGS: There is an acute, mildly displaced oblique fracture of the left 4th proximal phalanx. This demonstrates up to 2 mm of ulnar displacement. There is no intra-articular extension or dislocation. Underlying mild  interphalangeal degenerative changes are present. IMPRESSION: Mildly displaced extra-articular fracture of the 4th proximal phalanx. Electronically Signed   By: Richardean Sale M.D.   On: 02/26/2018 13:10   Dg Hip Unilat W Or Wo Pelvis 2-3 Views Right  Result Date: 02/26/2018 CLINICAL DATA:  Right hip pain after tripping and falling today. Initial encounter. EXAM: DG HIP (  WITH OR WITHOUT PELVIS) 2-3V RIGHT COMPARISON:  One-view pelvis 03/05/2016.  Pelvic CT 03/05/2016. FINDINGS: The bones are demineralized. There are acute mildly displaced fractures of the right superior and inferior pubic rami. There are stable posttraumatic deformities of the left pubic rami. No acetabular extension of the fractures is demonstrated. Both femoral heads appear intact. The sacroiliac joints are intact. Lower lumbar spondylosis noted. IMPRESSION: 1. Acute mildly displaced fractures of the right superior and inferior pubic rami. 2. No evidence of hip fracture or dislocation. Old left pubic rami fractures. Electronically Signed   By: Richardean Sale M.D.   On: 02/26/2018 13:09    Procedures Procedures (including critical care time)  Medications Ordered in ED Medications - No data to display   Initial Impression / Assessment and Plan / ED Course  I have reviewed the triage vital signs and the nursing notes.  Pertinent labs & imaging results that were available during my care of the patient were reviewed by me and considered in my medical decision making (see chart for details).     Patient with mechanical fall.  History of same.  Has finger fracture which was mobilized.  Will need Ortho follow-up.  Also has pubic rami fracture.  Had some difficulty tolerating weightbearing but patient and family were eager to go home.  Social work is seen patient.  Feel they have help at home.  Will discharge follow-up with Ortho and PCP.  Final Clinical Impressions(s) / ED Diagnoses   Final diagnoses:  Fall, initial encounter    Closed displaced fracture of proximal phalanx of left ring finger, initial encounter  Pubic ramus fracture, right, closed, initial encounter [facility])    ED Discharge Orders         Ordered    DME Bedside commode     02/26/18 Dooms, Kevontay Burks, MD 02/26/18 2028

## 2018-02-26 NOTE — ED Triage Notes (Signed)
Pt tripped and fell this morning injuring her right hip and left hand. Denies hitting her head and being on blood thinners

## 2018-02-26 NOTE — ED Notes (Addendum)
Patient not able to tolerating ambulation. Patient's left leg gave out every time she tried to take a step with her right leg.  Family told social worker that they would like a beside commode for home. Social Work requests 3 in 1 bedside commode for home.

## 2018-02-27 ENCOUNTER — Telehealth: Payer: Self-pay | Admitting: Nurse Practitioner

## 2018-02-27 DIAGNOSIS — S72001A Fracture of unspecified part of neck of right femur, initial encounter for closed fracture: Secondary | ICD-10-CM

## 2018-02-27 NOTE — Telephone Encounter (Signed)
Will defer to PCP when she returns tomorrow

## 2018-02-28 NOTE — Telephone Encounter (Signed)
[place] for wheel chair rx

## 2018-02-28 NOTE — Telephone Encounter (Signed)
Pt notified RX is ready for pick up rx to front

## 2018-03-04 DIAGNOSIS — M25551 Pain in right hip: Secondary | ICD-10-CM | POA: Diagnosis not present

## 2018-03-04 DIAGNOSIS — M79645 Pain in left finger(s): Secondary | ICD-10-CM | POA: Diagnosis not present

## 2018-03-06 DIAGNOSIS — L299 Pruritus, unspecified: Secondary | ICD-10-CM | POA: Diagnosis not present

## 2018-03-06 DIAGNOSIS — E859 Amyloidosis, unspecified: Secondary | ICD-10-CM | POA: Diagnosis not present

## 2018-03-08 ENCOUNTER — Ambulatory Visit (INDEPENDENT_AMBULATORY_CARE_PROVIDER_SITE_OTHER): Payer: Medicare HMO | Admitting: Nurse Practitioner

## 2018-03-08 ENCOUNTER — Encounter: Payer: Self-pay | Admitting: Nurse Practitioner

## 2018-03-08 VITALS — BP 108/62 | HR 83 | Temp 97.9°F | Ht 61.0 in | Wt 94.2 lb

## 2018-03-08 DIAGNOSIS — Z23 Encounter for immunization: Secondary | ICD-10-CM

## 2018-03-08 DIAGNOSIS — R2689 Other abnormalities of gait and mobility: Secondary | ICD-10-CM | POA: Diagnosis not present

## 2018-03-08 DIAGNOSIS — R2681 Unsteadiness on feet: Secondary | ICD-10-CM | POA: Diagnosis not present

## 2018-03-08 DIAGNOSIS — S3289XD Fracture of other parts of pelvis, subsequent encounter for fracture with routine healing: Secondary | ICD-10-CM

## 2018-03-08 NOTE — Patient Instructions (Signed)

## 2018-03-08 NOTE — Progress Notes (Signed)
   Subjective:    Patient ID: Sarah Boyd, female    DOB: 24-Feb-1930, 84 y.o.   MRN: [id number]   Chief Complaint: Wheel Chair Form   HPI Patient comes in today accompanied by her niece. Patient has had frequent falls the last several months due to a very unsteady gait. Last fall was 02/27/18 anqd she fractured her pelvis and left ring finger. She uses a walker at home. Needs a wheel chair so that they can get her out of the house without falling.   Review of Systems  Constitutional: Negative.   Respiratory: Negative.   Cardiovascular: Negative.   Gastrointestinal: Negative.   Musculoskeletal: Positive for gait problem.  Psychiatric/Behavioral: Negative.   All other systems reviewed and are negative.      Objective:   Physical Exam  Constitutional: She is oriented to person, place, and time. She appears well-nourished. No distress.  HENT:  Head: Normocephalic.  Nose: Nose normal.  Mouth/Throat: Oropharynx is clear and moist.  Eyes: Pupils are equal, round, and reactive to light. EOM are normal.  Neck: Normal range of motion. Neck supple. No JVD present. Carotid bruit is not present.  Cardiovascular: Normal rate, regular rhythm, normal heart sounds and intact distal pulses.  Pulmonary/Chest: Effort normal and breath sounds normal. No respiratory distress. She has no wheezes. She has no rales. She exhibits no tenderness.  Abdominal: Soft. Normal appearance, normal aorta and bowel sounds are normal. She exhibits no distension, no abdominal bruit, no pulsatile midline mass and no mass. There is no splenomegaly or hepatomegaly. There is no tenderness.  Musculoskeletal: Normal range of motion. She exhibits no edema.  Sitting in wheel chair trouble standing and gait difficult without support  Lymphadenopathy:    She has no cervical adenopathy.  Neurological: She is alert and oriented to person, place, and time. She has normal reflexes.  Skin: Skin is warm and dry.  Psychiatric:  She has a normal mood and affect. Her behavior is normal. Judgment and thought content normal.  Nursing note and vitals reviewed.  BP 108/62   Pulse 83   Temp 97.9 F (36.6 C) (Oral)   Ht 5\' 1"  (1.549 m)   Wt 94 lb 3.2 oz (42.7 kg)   BMI 17.80 kg/m         Assessment & Plan:  ULYSSA WALTHOUR in today with chief complaint of Wheel Chair Form   1. Need for assistance due to unsteady gait  2. Unsteady gait  3. Closed fracture of other part of pelvis with routine healing, subsequent encounter  Forms filled out for lightweight wheel chair Fall prevention RTO for routine follow ups  Mary-Margaret Hassell Done, FNP

## 2018-03-13 DIAGNOSIS — S72001A Fracture of unspecified part of neck of right femur, initial encounter for closed fracture: Secondary | ICD-10-CM | POA: Diagnosis not present

## 2018-03-13 DIAGNOSIS — M6281 Muscle weakness (generalized): Secondary | ICD-10-CM | POA: Diagnosis not present

## 2018-03-13 DIAGNOSIS — R296 Repeated falls: Secondary | ICD-10-CM | POA: Diagnosis not present

## 2018-03-13 DIAGNOSIS — R269 Unspecified abnormalities of gait and mobility: Secondary | ICD-10-CM | POA: Diagnosis not present

## 2018-03-13 DIAGNOSIS — W1830XA Fall on same level, unspecified, initial encounter: Secondary | ICD-10-CM | POA: Diagnosis not present

## 2018-03-14 ENCOUNTER — Other Ambulatory Visit: Payer: Self-pay | Admitting: Nurse Practitioner

## 2018-03-14 DIAGNOSIS — Z1231 Encounter for screening mammogram for malignant neoplasm of breast: Secondary | ICD-10-CM

## 2018-03-17 DIAGNOSIS — J4540 Moderate persistent asthma, uncomplicated: Secondary | ICD-10-CM | POA: Diagnosis not present

## 2018-03-17 DIAGNOSIS — J60 Coalworker's pneumoconiosis: Secondary | ICD-10-CM | POA: Diagnosis not present

## 2018-03-18 DIAGNOSIS — M79645 Pain in left finger(s): Secondary | ICD-10-CM | POA: Diagnosis not present

## 2018-03-18 DIAGNOSIS — M25551 Pain in right hip: Secondary | ICD-10-CM | POA: Diagnosis not present

## 2018-04-08 DIAGNOSIS — M79645 Pain in left finger(s): Secondary | ICD-10-CM | POA: Diagnosis not present

## 2018-04-13 DIAGNOSIS — S72001A Fracture of unspecified part of neck of right femur, initial encounter for closed fracture: Secondary | ICD-10-CM | POA: Diagnosis not present

## 2018-04-13 DIAGNOSIS — R296 Repeated falls: Secondary | ICD-10-CM | POA: Diagnosis not present

## 2018-04-13 DIAGNOSIS — W1830XA Fall on same level, unspecified, initial encounter: Secondary | ICD-10-CM | POA: Diagnosis not present

## 2018-04-13 DIAGNOSIS — M6281 Muscle weakness (generalized): Secondary | ICD-10-CM | POA: Diagnosis not present

## 2018-04-13 DIAGNOSIS — R269 Unspecified abnormalities of gait and mobility: Secondary | ICD-10-CM | POA: Diagnosis not present

## 2018-04-16 ENCOUNTER — Other Ambulatory Visit: Payer: Self-pay | Admitting: Nurse Practitioner

## 2018-04-16 ENCOUNTER — Ambulatory Visit
Admission: RE | Admit: 2018-04-16 | Discharge: 2018-04-16 | Disposition: A | Discharge status: Home / Self Care | Payer: Medicare HMO | Source: Ambulatory Visit | Attending: Nurse Practitioner | Admitting: Nurse Practitioner

## 2018-04-16 DIAGNOSIS — Z1231 Encounter for screening mammogram for malignant neoplasm of breast: Secondary | ICD-10-CM

## 2018-04-27 ENCOUNTER — Other Ambulatory Visit: Payer: Self-pay | Admitting: Nurse Practitioner

## 2018-04-27 DIAGNOSIS — M810 Age-related osteoporosis without current pathological fracture: Secondary | ICD-10-CM

## 2018-04-30 ENCOUNTER — Other Ambulatory Visit: Payer: Self-pay | Admitting: Nurse Practitioner

## 2018-04-30 DIAGNOSIS — J4520 Mild intermittent asthma, uncomplicated: Secondary | ICD-10-CM

## 2018-04-30 NOTE — Telephone Encounter (Signed)
Patient bone density was so bad and she has frequent falls and needs to be on prolia instead of alendronate

## 2018-05-01 NOTE — Telephone Encounter (Signed)
Started paperwork for [employer]

## 2018-05-02 DIAGNOSIS — H04123 Dry eye syndrome of bilateral lacrimal glands: Secondary | ICD-10-CM | POA: Diagnosis not present

## 2018-05-02 DIAGNOSIS — H43813 Vitreous degeneration, bilateral: Secondary | ICD-10-CM | POA: Diagnosis not present

## 2018-05-02 DIAGNOSIS — H52202 Unspecified astigmatism, left eye: Secondary | ICD-10-CM | POA: Diagnosis not present

## 2018-05-02 DIAGNOSIS — Z961 Presence of intraocular lens: Secondary | ICD-10-CM | POA: Diagnosis not present

## 2018-05-13 ENCOUNTER — Telehealth: Payer: Self-pay | Admitting: Nurse Practitioner

## 2018-05-13 DIAGNOSIS — M6281 Muscle weakness (generalized): Secondary | ICD-10-CM | POA: Diagnosis not present

## 2018-05-13 DIAGNOSIS — R296 Repeated falls: Secondary | ICD-10-CM | POA: Diagnosis not present

## 2018-05-13 DIAGNOSIS — W1830XA Fall on same level, unspecified, initial encounter: Secondary | ICD-10-CM | POA: Diagnosis not present

## 2018-05-13 DIAGNOSIS — R269 Unspecified abnormalities of gait and mobility: Secondary | ICD-10-CM | POA: Diagnosis not present

## 2018-05-13 DIAGNOSIS — S72001A Fracture of unspecified part of neck of right femur, initial encounter for closed fracture: Secondary | ICD-10-CM | POA: Diagnosis not present

## 2018-05-13 NOTE — Telephone Encounter (Signed)
Insurance verification submitted. Will wait for summary of benefits. Patient's last [place] on 02/26/17 was -3.4. She used alendronate for years and came off for drug holiday. Initial Prolia injection was 10/26/17.

## 2018-05-13 NOTE — Telephone Encounter (Signed)
It looks like pt had last Prolia Inj 10/26/17. Do we have to order this for pt? Please advise.

## 2018-05-15 NOTE — Telephone Encounter (Signed)
Summary of benefits received. Prolia is covered at 100% if bought and billed through the office. No prior authorization is necessary. Patient aware and appointment scheduled for 05/21/18.

## 2018-05-21 ENCOUNTER — Ambulatory Visit (INDEPENDENT_AMBULATORY_CARE_PROVIDER_SITE_OTHER): Payer: Medicare HMO | Admitting: *Deleted

## 2018-05-21 DIAGNOSIS — M810 Age-related osteoporosis without current pathological fracture: Secondary | ICD-10-CM | POA: Diagnosis not present

## 2018-05-21 MED ORDER — DENOSUMAB 60 MG/ML ~~LOC~~ SOSY
60.0000 mg | PREFILLED_SYRINGE | Freq: Once | SUBCUTANEOUS | Status: AC
  Administered 2018-05-21: 60 mg via SUBCUTANEOUS

## 2018-05-21 NOTE — Progress Notes (Signed)
Pt given Prolia inj Buy and bill Pt tolerated well Last Dexa 02/26/2017

## 2018-05-24 DIAGNOSIS — J4540 Moderate persistent asthma, uncomplicated: Secondary | ICD-10-CM | POA: Diagnosis not present

## 2018-05-24 DIAGNOSIS — J60 Coalworker's pneumoconiosis: Secondary | ICD-10-CM | POA: Diagnosis not present

## 2018-06-13 ENCOUNTER — Ambulatory Visit (INDEPENDENT_AMBULATORY_CARE_PROVIDER_SITE_OTHER): Payer: Medicare HMO | Admitting: Nurse Practitioner

## 2018-06-13 ENCOUNTER — Encounter: Payer: Self-pay | Admitting: Nurse Practitioner

## 2018-06-13 VITALS — BP 116/60 | HR 82 | Temp 96.5°F | Ht 61.0 in | Wt 95.0 lb

## 2018-06-13 DIAGNOSIS — E034 Atrophy of thyroid (acquired): Secondary | ICD-10-CM | POA: Diagnosis not present

## 2018-06-13 DIAGNOSIS — S72001A Fracture of unspecified part of neck of right femur, initial encounter for closed fracture: Secondary | ICD-10-CM | POA: Diagnosis not present

## 2018-06-13 DIAGNOSIS — J4520 Mild intermittent asthma, uncomplicated: Secondary | ICD-10-CM | POA: Diagnosis not present

## 2018-06-13 DIAGNOSIS — R296 Repeated falls: Secondary | ICD-10-CM | POA: Diagnosis not present

## 2018-06-13 DIAGNOSIS — W1830XA Fall on same level, unspecified, initial encounter: Secondary | ICD-10-CM | POA: Diagnosis not present

## 2018-06-13 DIAGNOSIS — M810 Age-related osteoporosis without current pathological fracture: Secondary | ICD-10-CM

## 2018-06-13 DIAGNOSIS — M6281 Muscle weakness (generalized): Secondary | ICD-10-CM | POA: Diagnosis not present

## 2018-06-13 DIAGNOSIS — R269 Unspecified abnormalities of gait and mobility: Secondary | ICD-10-CM | POA: Diagnosis not present

## 2018-06-13 MED ORDER — MONTELUKAST SODIUM 10 MG PO TABS: ORAL_TABLET | ORAL | 1 refills | Status: DC

## 2018-06-13 MED ORDER — LEVOTHYROXINE SODIUM 25 MCG PO TABS: 25.0000 ug | ORAL_TABLET | Freq: Every day | ORAL | 1 refills | Status: DC

## 2018-06-13 NOTE — Progress Notes (Signed)
Subjective:    Patient ID: Sarah Boyd, female    DOB: 10-29-1929, 85 y.o.   MRN: [id number]   Chief Complaint: medical management chronic issues  HPI:  1. Hypothyroidism due to acquired atrophy of thyroid  Not having any problems that I am aware of.  2. Age-related osteoporosis without current pathological fracture  dexascan was done 02/26/17 with tscore of -3.4. according to the chart she is not on anything. She started on prolia 2 months ago.  3. Mild intermittent chronic asthma without complication  Is on singulair daily and is doing well.  4. Frequent falls  No falls since last visit    Outpatient Encounter Medications as of 06/13/2018  Medication Sig  . Calcium Carbonate-Vitamin D (CALCIUM 600+D) 600-400 MG-UNIT tablet Take 1 tablet by mouth daily.  . cetirizine (ZYRTEC) 10 MG tablet Take 1 tablet (10 mg total) by mouth at bedtime.  . hydrOXYzine (ATARAX/VISTARIL) 10 MG tablet Take 1 tablet by mouth 1 hour before bed  . levothyroxine (SYNTHROID, LEVOTHROID) 25 MCG tablet TAKE 1 TABLET (25 MCG TOTAL) BY MOUTH DAILY BEFORE BREAKFAST.  . montelukast (SINGULAIR) 10 MG tablet TAKE 1 TABLET BY MOUTH EVERYDAY AT BEDTIME  . Multiple Vitamin (MULTIVITAMIN WITH MINERALS) TABS tablet Take 1 tablet by mouth daily.  . Soft Lens Products (SALINE SENSITIVE EYES) SOLN Place 1-2 drops into both eyes 2 (two) times daily as needed.  . triamcinolone cream (KENALOG) 0.1 % Apply 1 application topically 2 (two) times daily as needed. Apply to arms, back, legs, and hands as needed; do not apply to face or groin      New complaints: None today  Social history: Lives with daughter who is her caregiver.  Review of Systems  Constitutional: Negative for activity change and appetite change.  HENT: Negative.   Eyes: Negative for pain.  Respiratory: Negative for shortness of breath.   Cardiovascular: Negative for chest pain, palpitations and leg swelling.  Gastrointestinal: Negative for  abdominal pain.  Endocrine: Negative for polydipsia.  Genitourinary: Negative.   Skin: Negative for rash.  Neurological: Negative for dizziness, weakness and headaches.  Hematological: Does not bruise/bleed easily.  Psychiatric/Behavioral: Negative.   All other systems reviewed and are negative.      Objective:   Physical Exam Vitals signs and nursing note reviewed.  Constitutional:      General: She is not in acute distress.    Appearance: Normal appearance. She is well-developed.  HENT:     Head: Normocephalic.     Nose: Nose normal.  Eyes:     Pupils: Pupils are equal, round, and reactive to light.  Neck:     Musculoskeletal: Normal range of motion and neck supple.     Vascular: No carotid bruit or JVD.  Cardiovascular:     Rate and Rhythm: Normal rate and regular rhythm.     Heart sounds: Normal heart sounds.  Pulmonary:     Effort: Pulmonary effort is normal. No respiratory distress.     Breath sounds: Normal breath sounds. No wheezing or rales.  Chest:     Chest wall: No tenderness.  Abdominal:     General: Bowel sounds are normal. There is no distension or abdominal bruit.     Palpations: Abdomen is soft. There is no hepatomegaly, splenomegaly, mass or pulsatile mass.     Tenderness: There is no abdominal tenderness.  Musculoskeletal: Normal range of motion.     Right lower leg: Edema (ankle only) present.  Left lower leg: Edema (ankle only) present.  Lymphadenopathy:     Cervical: No cervical adenopathy.  Skin:    General: Skin is warm and dry.  Neurological:     Mental Status: She is alert and oriented to person, place, and time.     Deep Tendon Reflexes: Reflexes are normal and symmetric.  Psychiatric:        Behavior: Behavior normal.        Thought Content: Thought content normal.        Judgment: Judgment normal.    BP 116/60   Pulse 82   Temp (!) 96.5 F (35.8 C) (Oral)   Ht _0  (1.549 m)   Wt 95 lb (43.1 kg)   BMI 17.95 kg/m          Assessment & Plan:  Sarah Boyd comes in today with chief complaint of Medical Management of Chronic Issues   Diagnosis and orders addressed:  1. Hypothyroidism due to acquired atrophy of thyroid - levothyroxine (SYNTHROID, LEVOTHROID) 25 MCG tablet; Take 1 tablet (25 mcg total) by mouth daily before breakfast.  Dispense: 90 tablet; Refill: 1 - CMP14+EGFR - Lipid panel - Thyroid Panel With TSH  2. Age-related osteoporosis without current pathological fracture Weigh bearing exercises with walker Continue calcium and vitamin d  3. Mild intermittent chronic asthma without complication - montelukast (SINGULAIR) 10 MG tablet; TAKE 1 TABLET BY MOUTH EVERYDAY AT BEDTIME  Dispense: 90 tablet; Refill: 1  4. Frequent falls Fall prevention   Labs pending Health Maintenance reviewed Diet and exercise encouraged  Follow up plan: 6 months   Mary-Margaret Hassell Done, FNP

## 2018-06-13 NOTE — Patient Instructions (Signed)
Fall Prevention in the Home, Adult  Falls can cause injuries. They can happen to people of all ages. There are many things you can do to make your home safe and to help prevent falls. Ask for help when making these changes, if needed.  What actions can I take to prevent falls?  General Instructions  · Use good lighting in all rooms. Replace any light bulbs that burn out.  · Turn on the lights when you go into a dark area. Use night-lights.  · Keep items that you use often in easy-to-reach places. Lower the shelves around your home if necessary.  · Set up your furniture so you have a clear path. Avoid moving your furniture around.  · Do not have throw rugs and other things on the floor that can make you trip.  · Avoid walking on wet floors.  · If any of your floors are uneven, fix them.  · Add color or contrast paint or tape to clearly mark and help you see:  ? Any grab bars or handrails.  ? First and last steps of stairways.  ? Where the edge of each step is.  · If you use a stepladder:  ? Make sure that it is fully opened. Do not climb a closed stepladder.  ? Make sure that both sides of the stepladder are locked into place.  ? Ask someone to hold the stepladder for you while you use it.  · If there are any pets around you, be aware of where they are.  What can I do in the bathroom?         · Keep the floor dry. Clean up any water that spills onto the floor as soon as it happens.  · Remove soap buildup in the tub or shower regularly.  · Use non-skid mats or decals on the floor of the tub or shower.  · Attach bath mats securely with double-sided, non-slip rug tape.  · If you need to sit down in the shower, use a plastic, non-slip stool.  · Install grab bars by the toilet and in the tub and shower. Do not use towel bars as grab bars.  What can I do in the bedroom?  · Make sure that you have a light by your bed that is easy to reach.  · Do not use any sheets or blankets that are too big for your bed. They should  not hang down onto the floor.  · Have a firm chair that has side arms. You can use this for support while you get dressed.  What can I do in the kitchen?  · Clean up any spills right away.  · If you need to reach something above you, use a strong step stool that has a grab bar.  · Keep electrical cords out of the way.  · Do not use floor polish or wax that makes floors slippery. If you must use wax, use non-skid floor wax.  What can I do with my stairs?  · Do not leave any items on the stairs.  · Make sure that you have a light switch at the top of the stairs and the bottom of the stairs. If you do not have them, ask someone to add them for you.  · Make sure that there are handrails on both sides of the stairs, and use them. Fix handrails that are broken or loose. Make sure that handrails are as long as the stairways.  ·   Install non-slip stair treads on all stairs in your home.  · Avoid having throw rugs at the top or bottom of the stairs. If you do have throw rugs, attach them to the floor with carpet tape.  · Choose a carpet that does not hide the edge of the steps on the stairway.  · Check any carpeting to make sure that it is firmly attached to the stairs. Fix any carpet that is loose or worn.  What can I do on the outside of my home?  · Use bright outdoor lighting.  · Regularly fix the edges of walkways and driveways and fix any cracks.  · Remove anything that might make you trip as you walk through a door, such as a raised step or threshold.  · Trim any bushes or trees on the path to your home.  · Regularly check to see if handrails are loose or broken. Make sure that both sides of any steps have handrails.  · Install guardrails along the edges of any raised decks and porches.  · Clear walking paths of anything that might make someone trip, such as tools or rocks.  · Have any leaves, snow, or ice cleared regularly.  · Use sand or salt on walking paths during winter.  · Clean up any spills in your garage right  away. This includes grease or oil spills.  What other actions can I take?  · Wear shoes that:  ? Have a low heel. Do not wear high heels.  ? Have rubber bottoms.  ? Are comfortable and fit you well.  ? Are closed at the toe. Do not wear open-toe sandals.  · Use tools that help you move around (mobility aids) if they are needed. These include:  ? Canes.  ? Walkers.  ? Scooters.  ? Crutches.  · Review your medicines with your doctor. Some medicines can make you feel dizzy. This can increase your chance of falling.  Ask your doctor what other things you can do to help prevent falls.  Where to find more information  · Centers for Disease Control and Prevention, [employer]AD[employer]: https://cdc.gov  · National Institute on Aging: https://go4life.nia.nih.gov  Contact a doctor if:  · You are afraid of falling at home.  · You feel weak, drowsy, or dizzy at home.  · You fall at home.  Summary  · There are many simple things that you can do to make your home safe and to help prevent falls.  · Ways to make your home safe include removing tripping hazards and installing grab bars in the bathroom.  · Ask for help when making these changes in your home.  This information is not intended to replace advice given to you by your health care provider. Make sure you discuss any questions you have with your health care provider.  Document Released: 03/25/2009 Document Revised: 01/11/2017 Document Reviewed: 01/11/2017  Elsevier Interactive Patient Education © 2019 Elsevier Inc.

## 2018-06-14 DIAGNOSIS — R269 Unspecified abnormalities of gait and mobility: Secondary | ICD-10-CM | POA: Diagnosis not present

## 2018-06-14 DIAGNOSIS — M6281 Muscle weakness (generalized): Secondary | ICD-10-CM | POA: Diagnosis not present

## 2018-06-14 LAB — CMP14+EGFR
ALT: 14 IU/L (ref 0–32)
AST: 19 IU/L (ref 0–40)
Albumin/Globulin Ratio: 1.6 (ref 1.2–2.2)
Albumin: 4 g/dL (ref 3.5–4.7)
Alkaline Phosphatase: 75 IU/L (ref 39–117)
BUN/Creatinine Ratio: 13 (ref 12–28)
BUN: 11 mg/dL (ref 8–27)
Bilirubin Total: 0.3 mg/dL (ref 0.0–1.2)
CALCIUM: 8.9 mg/dL (ref 8.7–10.3)
CO2: 26 mmol/L (ref 20–29)
CREATININE: 0.88 mg/dL (ref 0.57–1.00)
Chloride: 104 mmol/L (ref 96–106)
GFR calc Af Amer: 69 mL/min/{1.73_m2} (ref >59)
GFR calc non Af Amer: 60 mL/min/{1.73_m2} (ref >59)
GLOBULIN, TOTAL: 2.5 g/dL (ref 1.5–4.5)
GLUCOSE: 96 mg/dL (ref 65–99)
Potassium: 4.2 mmol/L (ref 3.5–5.2)
Sodium: 142 mmol/L (ref 134–144)
Total Protein: 6.5 g/dL (ref 6.0–8.5)

## 2018-06-14 LAB — LIPID PANEL
CHOL/HDL RATIO: 1.9 ratio (ref 0.0–4.4)
CHOLESTEROL TOTAL: 167 mg/dL (ref 100–199)
HDL: 89 mg/dL (ref >39)
LDL CALC: 60 mg/dL (ref 0–99)
TRIGLYCERIDES: 89 mg/dL (ref 0–149)
VLDL CHOLESTEROL CAL: 18 mg/dL (ref 5–40)

## 2018-06-14 LAB — THYROID PANEL WITH TSH
Free Thyroxine Index: 1.6 (ref 1.2–4.9)
T3 UPTAKE RATIO: 25 % (ref 24–39)
T4, Total: 6.5 ug/dL (ref 4.5–12.0)
TSH: 2.14 u[IU]/mL (ref 0.450–4.500)

## 2018-07-03 ENCOUNTER — Other Ambulatory Visit: Payer: Self-pay | Admitting: *Deleted

## 2018-07-03 DIAGNOSIS — R296 Repeated falls: Secondary | ICD-10-CM

## 2018-07-03 DIAGNOSIS — J4540 Moderate persistent asthma, uncomplicated: Secondary | ICD-10-CM | POA: Diagnosis not present

## 2018-07-03 DIAGNOSIS — J60 Coalworker's pneumoconiosis: Secondary | ICD-10-CM | POA: Diagnosis not present

## 2018-07-03 DIAGNOSIS — E034 Atrophy of thyroid (acquired): Secondary | ICD-10-CM

## 2018-07-03 DIAGNOSIS — J4520 Mild intermittent asthma, uncomplicated: Secondary | ICD-10-CM

## 2018-07-03 DIAGNOSIS — M810 Age-related osteoporosis without current pathological fracture: Secondary | ICD-10-CM

## 2018-07-03 DIAGNOSIS — S3289XD Fracture of other parts of pelvis, subsequent encounter for fracture with routine healing: Secondary | ICD-10-CM

## 2018-07-03 NOTE — Progress Notes (Signed)
CCM services considered at PCP visit but order was not placed.

## 2018-07-05 ENCOUNTER — Telehealth: Payer: Self-pay

## 2018-07-14 DIAGNOSIS — R269 Unspecified abnormalities of gait and mobility: Secondary | ICD-10-CM | POA: Diagnosis not present

## 2018-07-14 DIAGNOSIS — S72001A Fracture of unspecified part of neck of right femur, initial encounter for closed fracture: Secondary | ICD-10-CM | POA: Diagnosis not present

## 2018-07-14 DIAGNOSIS — W1830XA Fall on same level, unspecified, initial encounter: Secondary | ICD-10-CM | POA: Diagnosis not present

## 2018-07-14 DIAGNOSIS — R296 Repeated falls: Secondary | ICD-10-CM | POA: Diagnosis not present

## 2018-07-14 DIAGNOSIS — M6281 Muscle weakness (generalized): Secondary | ICD-10-CM | POA: Diagnosis not present

## 2018-07-15 DIAGNOSIS — R269 Unspecified abnormalities of gait and mobility: Secondary | ICD-10-CM | POA: Diagnosis not present

## 2018-07-19 ENCOUNTER — Ambulatory Visit: Payer: 59 | Admitting: *Deleted

## 2018-07-19 DIAGNOSIS — M810 Age-related osteoporosis without current pathological fracture: Secondary | ICD-10-CM

## 2018-07-19 DIAGNOSIS — S3289XD Fracture of other parts of pelvis, subsequent encounter for fracture with routine healing: Secondary | ICD-10-CM

## 2018-07-19 DIAGNOSIS — E034 Atrophy of thyroid (acquired): Secondary | ICD-10-CM

## 2018-07-19 DIAGNOSIS — J4520 Mild intermittent asthma, uncomplicated: Secondary | ICD-10-CM

## 2018-07-19 DIAGNOSIS — R296 Repeated falls: Secondary | ICD-10-CM

## 2018-07-19 NOTE — Chronic Care Management (AMB) (Addendum)
  Care Management Note   Sarah Boyd is a 85 y.o. year old female who sees Chevis Pretty, [place] for primary care. Shelah Lewandowsky asked the CCM team to consult the patient for assistance with chronic disease management related to frequent falls, asthma, hypothyroidism, and osteoporosis with hx of pelvic fracture. Referral was placed on 07/03/2018.  Review of patient status, including review of consultants reports, relevant laboratory and other test results, and collaboration with appropriate care team members and the patient's provider was performed as part of comprehensive patient evaluation and provision of chronic care management services. Telephone outreach to patient today to introduce CCM services.   I reached out to Sarah Boyd by phone today but was unable to speak with her. I did leave a message on her voicemail requesting that she return my call.    Follow Up Plan: The CM team will reach out to the patient again over the next 7 days.    Chong Sicilian, RN-BC, BSN Nurse Case Manager [facility] Ph: [phone]  "I have reviewed this encounter including the documentation in this note and/or discussed this patient with the nurse coordinator, k.Leverne Amrhein . I am certifying that I agree with the content of this note as supervising physician." Sharon, FNP

## 2018-07-25 ENCOUNTER — Telehealth: Payer: 59

## 2018-08-03 DIAGNOSIS — J4540 Moderate persistent asthma, uncomplicated: Secondary | ICD-10-CM | POA: Diagnosis not present

## 2018-08-03 DIAGNOSIS — J60 Coalworker's pneumoconiosis: Secondary | ICD-10-CM | POA: Diagnosis not present

## 2018-08-12 DIAGNOSIS — R296 Repeated falls: Secondary | ICD-10-CM | POA: Diagnosis not present

## 2018-08-12 DIAGNOSIS — S72001A Fracture of unspecified part of neck of right femur, initial encounter for closed fracture: Secondary | ICD-10-CM | POA: Diagnosis not present

## 2018-08-12 DIAGNOSIS — R269 Unspecified abnormalities of gait and mobility: Secondary | ICD-10-CM | POA: Diagnosis not present

## 2018-08-20 DIAGNOSIS — L299 Pruritus, unspecified: Secondary | ICD-10-CM | POA: Diagnosis not present

## 2018-08-20 DIAGNOSIS — D692 Other nonthrombocytopenic purpura: Secondary | ICD-10-CM | POA: Diagnosis not present

## 2018-08-20 DIAGNOSIS — E859 Amyloidosis, unspecified: Secondary | ICD-10-CM | POA: Diagnosis not present

## 2018-09-08 DIAGNOSIS — M79602 Pain in left arm: Secondary | ICD-10-CM | POA: Diagnosis not present

## 2018-09-08 DIAGNOSIS — W19XXXA Unspecified fall, initial encounter: Secondary | ICD-10-CM | POA: Diagnosis not present

## 2018-09-08 DIAGNOSIS — S51812A Laceration without foreign body of left forearm, initial encounter: Secondary | ICD-10-CM | POA: Diagnosis not present

## 2018-09-12 DIAGNOSIS — S72001A Fracture of unspecified part of neck of right femur, initial encounter for closed fracture: Secondary | ICD-10-CM | POA: Diagnosis not present

## 2018-09-12 DIAGNOSIS — R269 Unspecified abnormalities of gait and mobility: Secondary | ICD-10-CM | POA: Diagnosis not present

## 2018-09-12 DIAGNOSIS — R296 Repeated falls: Secondary | ICD-10-CM | POA: Diagnosis not present

## 2018-09-23 ENCOUNTER — Other Ambulatory Visit: Payer: Self-pay | Admitting: Nurse Practitioner

## 2018-09-23 DIAGNOSIS — M810 Age-related osteoporosis without current pathological fracture: Secondary | ICD-10-CM

## 2018-10-12 DIAGNOSIS — R296 Repeated falls: Secondary | ICD-10-CM | POA: Diagnosis not present

## 2018-10-12 DIAGNOSIS — R269 Unspecified abnormalities of gait and mobility: Secondary | ICD-10-CM | POA: Diagnosis not present

## 2018-10-12 DIAGNOSIS — S72001A Fracture of unspecified part of neck of right femur, initial encounter for closed fracture: Secondary | ICD-10-CM | POA: Diagnosis not present

## 2018-11-01 DIAGNOSIS — J4540 Moderate persistent asthma, uncomplicated: Secondary | ICD-10-CM | POA: Diagnosis not present

## 2018-11-01 DIAGNOSIS — J60 Coalworker's pneumoconiosis: Secondary | ICD-10-CM | POA: Diagnosis not present

## 2018-11-12 DIAGNOSIS — S72001A Fracture of unspecified part of neck of right femur, initial encounter for closed fracture: Secondary | ICD-10-CM | POA: Diagnosis not present

## 2018-11-12 DIAGNOSIS — R269 Unspecified abnormalities of gait and mobility: Secondary | ICD-10-CM | POA: Diagnosis not present

## 2018-11-12 DIAGNOSIS — R296 Repeated falls: Secondary | ICD-10-CM | POA: Diagnosis not present

## 2018-11-19 ENCOUNTER — Telehealth: Payer: Self-pay | Admitting: *Deleted

## 2018-11-19 ENCOUNTER — Ambulatory Visit: Payer: Self-pay

## 2018-11-19 NOTE — Telephone Encounter (Signed)
PROLIA: Summary of Benefits Interpretation  November 19, 2018     Purchase Information  Last purchase location: [facility] buy and [employer] . Primary: [place] Medicare - Qualified Medicare Beneficiary Program Secondary: Medicaid   Summary of Benefits  . Received on: 11/15/2018 . Estimated Cost to Patient: $0 . Prior authorization required: No   . Physician Purchase Covered: Yes  . Specialty Pharmacy Covered:No    Patient notified of insurance coverage.  Scheduled her to come in for Prolia Injection 11/21/2018.    Hulen Skains, AMY M   11/19/2018 [place] [phone]

## 2018-11-21 ENCOUNTER — Other Ambulatory Visit: Payer: Self-pay

## 2018-11-21 ENCOUNTER — Ambulatory Visit (INDEPENDENT_AMBULATORY_CARE_PROVIDER_SITE_OTHER): Payer: Medicare HMO | Admitting: *Deleted

## 2018-11-21 DIAGNOSIS — M810 Age-related osteoporosis without current pathological fracture: Secondary | ICD-10-CM | POA: Diagnosis not present

## 2018-11-21 MED ORDER — DENOSUMAB 60 MG/ML ~~LOC~~ SOSY
60.0000 mg | PREFILLED_SYRINGE | Freq: Once | SUBCUTANEOUS | Status: AC
  Administered 2018-11-21: 15:00:00 60 mg via SUBCUTANEOUS

## 2018-11-21 NOTE — Progress Notes (Signed)
Pt given Prolia inj Tolerated well Buy and bill dexa due in Sept

## 2018-11-26 ENCOUNTER — Telehealth: Payer: Medicare HMO

## 2018-12-12 ENCOUNTER — Ambulatory Visit (INDEPENDENT_AMBULATORY_CARE_PROVIDER_SITE_OTHER): Payer: Medicare HMO | Admitting: Nurse Practitioner

## 2018-12-12 ENCOUNTER — Encounter: Payer: Self-pay | Admitting: Nurse Practitioner

## 2018-12-12 ENCOUNTER — Other Ambulatory Visit: Payer: Self-pay

## 2018-12-12 VITALS — BP 121/54 | HR 88 | Temp 97.2°F | Ht 61.0 in | Wt 95.0 lb

## 2018-12-12 DIAGNOSIS — J4540 Moderate persistent asthma, uncomplicated: Secondary | ICD-10-CM | POA: Diagnosis not present

## 2018-12-12 DIAGNOSIS — J4520 Mild intermittent asthma, uncomplicated: Secondary | ICD-10-CM | POA: Diagnosis not present

## 2018-12-12 DIAGNOSIS — S72001A Fracture of unspecified part of neck of right femur, initial encounter for closed fracture: Secondary | ICD-10-CM | POA: Diagnosis not present

## 2018-12-12 DIAGNOSIS — R296 Repeated falls: Secondary | ICD-10-CM

## 2018-12-12 DIAGNOSIS — J60 Coalworker's pneumoconiosis: Secondary | ICD-10-CM | POA: Diagnosis not present

## 2018-12-12 DIAGNOSIS — R269 Unspecified abnormalities of gait and mobility: Secondary | ICD-10-CM | POA: Diagnosis not present

## 2018-12-12 DIAGNOSIS — E034 Atrophy of thyroid (acquired): Secondary | ICD-10-CM

## 2018-12-12 DIAGNOSIS — M810 Age-related osteoporosis without current pathological fracture: Secondary | ICD-10-CM | POA: Diagnosis not present

## 2018-12-12 MED ORDER — MONTELUKAST SODIUM 10 MG PO TABS: ORAL_TABLET | ORAL | 1 refills | Status: DC

## 2018-12-12 MED ORDER — LEVOTHYROXINE SODIUM 25 MCG PO TABS: 25.0000 ug | ORAL_TABLET | Freq: Every day | ORAL | 1 refills | Status: DC

## 2018-12-12 NOTE — Patient Instructions (Signed)

## 2018-12-12 NOTE — Progress Notes (Signed)
Subjective:    Patient ID: Sarah Boyd, female    DOB: 05-30-1930, 85 y.o.   MRN: [id number]   Chief Complaint: Medical Management of Chronic Issues    HPI:  1. Hypothyroidism due to acquired atrophy of thyroid No problem that she is aware of  2. Age-related osteoporosis without current pathological fracture Last dexascan was done 02/27/19 with t score of -3.4. she is no longer on fosamax and is now getting prolia injections. We will repeat dexascan at next visit to see if helping. She is unable to do much weight bearing exercises because she has a history of falls.  3. Mild intermittent chronic asthma without complication Denies any sob , or wheezing  4. Frequent falls She has fallen 2 x in the last month. She says she slipped off bed both times and landed in the floor. She had no injuries from either fall, other then just some bruising.    Outpatient Encounter Medications as of 12/12/2018  Medication Sig  . alendronate (FOSAMAX) 70 MG tablet TAKE 1 TABLET ONCE A WEEK WITH A FULL GLASS OF WATER ON AN EMPTY STOMACH  . Calcium Carbonate-Vitamin D (CALCIUM 600+D) 600-400 MG-UNIT tablet Take 1 tablet by mouth daily.  . cetirizine (ZYRTEC) 10 MG tablet Take 1 tablet (10 mg total) by mouth at bedtime.  . hydrOXYzine (ATARAX/VISTARIL) 10 MG tablet Take 1 tablet by mouth 1 hour before bed  . levothyroxine (SYNTHROID, LEVOTHROID) 25 MCG tablet Take 1 tablet (25 mcg total) by mouth daily before breakfast.  . montelukast (SINGULAIR) 10 MG tablet TAKE 1 TABLET BY MOUTH EVERYDAY AT BEDTIME  . Multiple Vitamin (MULTIVITAMIN WITH MINERALS) TABS tablet Take 1 tablet by mouth daily.  . Soft Lens Products (SALINE SENSITIVE EYES) SOLN Place 1-2 drops into both eyes 2 (two) times daily as needed.  . triamcinolone cream (KENALOG) 0.1 % Apply 1 application topically 2 (two) times daily as needed. Apply to arms, back, legs, and hands as needed; do not apply to face or groin     Past Surgical  History:  Procedure Laterality Date  . CATARACT EXTRACTION, BILATERAL    . EXCISION OF RIGHT CHEST MASS  12-19-1999   BENIGN  . MASTECTOMY  1990  (APPROX.)   RIGHT BREAST CANCER -- NO RECURRENCE  . ORIF BILATERAL WRIST  2009  . SHOULDER ARTHROSCOPY  12/06/2011   Procedure: ARTHROSCOPY SHOULDER;  Surgeon: Magnus Sinning, MD;  Location: [facility];  Service: Orthopedics;  Laterality: Right;  WITH LABRIAL DEBRIDEMENT AND SAD INTERSCALINE BLOCK    Family History  Problem Relation Age of Onset  . Stroke Mother   . Rheumatic fever Father   . Arthritis Sister        back and knees  . Cancer Brother        prostate  . Arthritis Sister        back and knees // rheumatoid  . Breast cancer Sister   . Cancer Sister        esophogeal  . Cancer Sister        lymphoma - non hodge.  . Dementia Sister   . Diabetes Sister   . Dementia Sister   . Arthritis Sister        hips  . Dementia Sister   . Dementia Sister     New complaints: None today  Social history: Her niece lives with her and is her caregiver.     Review of Systems  Constitutional: Negative  for activity change and appetite change.  HENT: Negative.   Eyes: Negative for pain.  Respiratory: Negative for shortness of breath.   Cardiovascular: Negative for chest pain, palpitations and leg swelling.  Gastrointestinal: Negative for abdominal pain.  Endocrine: Negative for polydipsia.  Genitourinary: Negative.   Skin: Negative for rash.  Neurological: Negative for dizziness, weakness and headaches.  Hematological: Does not bruise/bleed easily.  Psychiatric/Behavioral: Negative.   All other systems reviewed and are negative.      Objective:   Physical Exam Vitals signs and nursing note reviewed.  Constitutional:      General: She is not in acute distress.    Appearance: Normal appearance. She is well-developed.  HENT:     Head: Normocephalic.     Nose: Nose normal.  Eyes:     Pupils: Pupils  are equal, round, and reactive to light.  Neck:     Musculoskeletal: Normal range of motion and neck supple.     Vascular: No carotid bruit or JVD.  Cardiovascular:     Rate and Rhythm: Normal rate and regular rhythm.     Heart sounds: Normal heart sounds.  Pulmonary:     Effort: Pulmonary effort is normal. No respiratory distress.     Breath sounds: Normal breath sounds. No wheezing or rales.  Chest:     Chest wall: No tenderness.  Abdominal:     General: Bowel sounds are normal. There is no distension or abdominal bruit.     Palpations: Abdomen is soft. There is no hepatomegaly, splenomegaly, mass or pulsatile mass.     Tenderness: There is no abdominal tenderness.  Musculoskeletal: Normal range of motion.     Comments: Walking with walker- unable to get on exam table  Lymphadenopathy:     Cervical: No cervical adenopathy.  Skin:    General: Skin is warm and dry.  Neurological:     Mental Status: She is alert and oriented to person, place, and time.     Deep Tendon Reflexes: Reflexes are normal and symmetric.  Psychiatric:        Behavior: Behavior normal.        Thought Content: Thought content normal.        Judgment: Judgment normal.    BP (!) 121/54   Pulse 88   Temp (!) 97.2 F (36.2 C) (Oral)   Ht '5\' 1"'  (1.549 m)   Wt 95 lb (43.1 kg)   BMI 17.95 kg/m         Assessment & Plan:  Sarah Boyd comes in today with chief complaint of Medical Management of Chronic Issues   Diagnosis and orders addressed:  1. Hypothyroidism due to acquired atrophy of thyroid - levothyroxine (SYNTHROID) 25 MCG tablet; Take 1 tablet (25 mcg total) by mouth daily before breakfast.  Dispense: 90 tablet; Refill: 1 - CMP14+EGFR - Lipid panel - Thyroid Panel With TSH  2. Age-related osteoporosis without current pathological fracture Will repeat dexscanat next visit  3. Mild intermittent chronic asthma without complication - montelukast (SINGULAIR) 10 MG tablet; TAKE 1 TABLET  BY MOUTH EVERYDAY AT BEDTIME  Dispense: 90 tablet; Refill: 1  4. Frequent falls Fall prevention   Labs pending Health Maintenance reviewed Diet and exercise encouraged  Follow up plan: 6 months   Mary-Margaret Hassell Done, FNP

## 2018-12-13 LAB — CMP14+EGFR
ALT: 15 IU/L (ref 0–32)
AST: 20 IU/L (ref 0–40)
Albumin/Globulin Ratio: 1.8 (ref 1.2–2.2)
Albumin: 4.2 g/dL (ref 3.6–4.6)
Alkaline Phosphatase: 59 IU/L (ref 39–117)
BUN/Creatinine Ratio: 17 (ref 12–28)
BUN: 13 mg/dL (ref 8–27)
Bilirubin Total: 0.2 mg/dL (ref 0.0–1.2)
CO2: 24 mmol/L (ref 20–29)
Calcium: 8.8 mg/dL (ref 8.7–10.3)
Chloride: 103 mmol/L (ref 96–106)
Creatinine, Ser: 0.77 mg/dL (ref 0.57–1.00)
GFR calc Af Amer: 81 mL/min/{1.73_m2} (ref >59)
GFR calc non Af Amer: 70 mL/min/{1.73_m2} (ref >59)
Globulin, Total: 2.4 g/dL (ref 1.5–4.5)
Glucose: 81 mg/dL (ref 65–99)
Potassium: 4.5 mmol/L (ref 3.5–5.2)
Sodium: 143 mmol/L (ref 134–144)
Total Protein: 6.6 g/dL (ref 6.0–8.5)

## 2018-12-13 LAB — LIPID PANEL
Chol/HDL Ratio: 2 ratio (ref 0.0–4.4)
Cholesterol, Total: 174 mg/dL (ref 100–199)
HDL: 89 mg/dL (ref >39)
LDL Calculated: 64 mg/dL (ref 0–99)
Triglycerides: 103 mg/dL (ref 0–149)
VLDL Cholesterol Cal: 21 mg/dL (ref 5–40)

## 2018-12-13 LAB — THYROID PANEL WITH TSH
Free Thyroxine Index: 1.8 (ref 1.2–4.9)
T3 Uptake Ratio: 24 % (ref 24–39)
T4, Total: 7.4 ug/dL (ref 4.5–12.0)
TSH: 2.39 u[IU]/mL (ref 0.450–4.500)

## 2019-01-12 DIAGNOSIS — R296 Repeated falls: Secondary | ICD-10-CM | POA: Diagnosis not present

## 2019-01-12 DIAGNOSIS — S72001A Fracture of unspecified part of neck of right femur, initial encounter for closed fracture: Secondary | ICD-10-CM | POA: Diagnosis not present

## 2019-01-12 DIAGNOSIS — R269 Unspecified abnormalities of gait and mobility: Secondary | ICD-10-CM | POA: Diagnosis not present

## 2019-01-12 DIAGNOSIS — J4540 Moderate persistent asthma, uncomplicated: Secondary | ICD-10-CM | POA: Diagnosis not present

## 2019-01-12 DIAGNOSIS — J60 Coalworker's pneumoconiosis: Secondary | ICD-10-CM | POA: Diagnosis not present

## 2019-02-03 DIAGNOSIS — L910 Hypertrophic scar: Secondary | ICD-10-CM | POA: Diagnosis not present

## 2019-02-03 DIAGNOSIS — L309 Dermatitis, unspecified: Secondary | ICD-10-CM | POA: Diagnosis not present

## 2019-02-03 DIAGNOSIS — L299 Pruritus, unspecified: Secondary | ICD-10-CM | POA: Diagnosis not present

## 2019-02-12 DIAGNOSIS — S72001A Fracture of unspecified part of neck of right femur, initial encounter for closed fracture: Secondary | ICD-10-CM | POA: Diagnosis not present

## 2019-02-12 DIAGNOSIS — R269 Unspecified abnormalities of gait and mobility: Secondary | ICD-10-CM | POA: Diagnosis not present

## 2019-02-12 DIAGNOSIS — R296 Repeated falls: Secondary | ICD-10-CM | POA: Diagnosis not present

## 2019-03-14 DIAGNOSIS — R296 Repeated falls: Secondary | ICD-10-CM | POA: Diagnosis not present

## 2019-03-14 DIAGNOSIS — R269 Unspecified abnormalities of gait and mobility: Secondary | ICD-10-CM | POA: Diagnosis not present

## 2019-03-14 DIAGNOSIS — S72001A Fracture of unspecified part of neck of right femur, initial encounter for closed fracture: Secondary | ICD-10-CM | POA: Diagnosis not present

## 2019-03-23 DIAGNOSIS — J60 Coalworker's pneumoconiosis: Secondary | ICD-10-CM | POA: Diagnosis not present

## 2019-03-23 DIAGNOSIS — R1310 Dysphagia, unspecified: Secondary | ICD-10-CM | POA: Diagnosis not present

## 2019-04-16 DIAGNOSIS — C50911 Malignant neoplasm of unspecified site of right female breast: Secondary | ICD-10-CM | POA: Diagnosis not present

## 2019-04-23 DIAGNOSIS — R1310 Dysphagia, unspecified: Secondary | ICD-10-CM | POA: Diagnosis not present

## 2019-05-05 DIAGNOSIS — H04123 Dry eye syndrome of bilateral lacrimal glands: Secondary | ICD-10-CM | POA: Diagnosis not present

## 2019-05-05 DIAGNOSIS — Z961 Presence of intraocular lens: Secondary | ICD-10-CM | POA: Diagnosis not present

## 2019-05-05 DIAGNOSIS — H52203 Unspecified astigmatism, bilateral: Secondary | ICD-10-CM | POA: Diagnosis not present

## 2019-05-05 DIAGNOSIS — H16103 Unspecified superficial keratitis, bilateral: Secondary | ICD-10-CM | POA: Diagnosis not present

## 2019-05-13 ENCOUNTER — Other Ambulatory Visit: Payer: Self-pay

## 2019-05-13 ENCOUNTER — Ambulatory Visit (INDEPENDENT_AMBULATORY_CARE_PROVIDER_SITE_OTHER): Payer: Medicare HMO

## 2019-05-13 DIAGNOSIS — Z23 Encounter for immunization: Secondary | ICD-10-CM | POA: Diagnosis not present

## 2019-05-23 DIAGNOSIS — R1310 Dysphagia, unspecified: Secondary | ICD-10-CM | POA: Diagnosis not present

## 2019-06-04 ENCOUNTER — Other Ambulatory Visit: Payer: Self-pay | Admitting: Nurse Practitioner

## 2019-06-04 DIAGNOSIS — Z1231 Encounter for screening mammogram for malignant neoplasm of breast: Secondary | ICD-10-CM

## 2019-06-16 ENCOUNTER — Encounter: Payer: Self-pay | Admitting: Nurse Practitioner

## 2019-06-16 ENCOUNTER — Ambulatory Visit (INDEPENDENT_AMBULATORY_CARE_PROVIDER_SITE_OTHER): Payer: Medicare HMO | Admitting: Nurse Practitioner

## 2019-06-16 DIAGNOSIS — R296 Repeated falls: Secondary | ICD-10-CM

## 2019-06-16 DIAGNOSIS — J4520 Mild intermittent asthma, uncomplicated: Secondary | ICD-10-CM

## 2019-06-16 DIAGNOSIS — M810 Age-related osteoporosis without current pathological fracture: Secondary | ICD-10-CM | POA: Diagnosis not present

## 2019-06-16 DIAGNOSIS — E034 Atrophy of thyroid (acquired): Secondary | ICD-10-CM

## 2019-06-16 MED ORDER — MONTELUKAST SODIUM 10 MG PO TABS: ORAL_TABLET | ORAL | 1 refills | Status: DC

## 2019-06-16 MED ORDER — LEVOTHYROXINE SODIUM 25 MCG PO TABS: 25.0000 ug | ORAL_TABLET | Freq: Every day | ORAL | 1 refills | Status: DC

## 2019-06-16 NOTE — Progress Notes (Signed)
Virtual Visit via telephone Note Due to COVID-19 pandemic this visit was conducted virtually. This visit type was conducted due to national recommendations for restrictions regarding the COVID-19 Pandemic (e.g. social distancing, sheltering in place) in an effort to limit this patient's exposure and mitigate transmission in our community. All issues noted in this document were discussed and addressed.  A physical exam was not performed with this format.  I connected with Sarah Boyd on 06/16/19 at 10:55 by telephone and verified that I am speaking with the correct person using two identifiers. [facility] is currently located at [facility] and her neice is currently with her during visit. The provider, Mary-Margaret Hassell Done, FNP is located in their office at time of visit.  I discussed the limitations, risks, security and privacy concerns of performing an evaluation and management service by telephone and the availability of in person appointments. I also discussed with the patient that there may be a patient responsible charge related to this service. The patient expressed understanding and agreed to proceed.   History and Present Illness:   Chief Complaint: Medical Management of Chronic Issues    HPI:  1. Hypothyroidism due to acquired atrophy of thyroid No problems that she is aware of. Lab Results  Component Value Date   TSH 2.390 12/12/2018     2. Mild intermittent chronic asthma without complication Has occasional cough and wheezing. Is on singulair daily  3. Age-related osteoporosis without current pathological fracture She does not do much exercise  4. Frequent falls She has not fallen since her last office visit. Uses her walker most of the time.    Outpatient Encounter Medications as of 06/16/2019  Medication Sig  . Calcium Carbonate-Vitamin D (CALCIUM 600+D) 600-400 MG-UNIT tablet Take 1 tablet by mouth daily.  . cetirizine (ZYRTEC) 10 MG tablet Take 1 tablet (10  mg total) by mouth at bedtime.  . hydrOXYzine (ATARAX/VISTARIL) 10 MG tablet Take 1 tablet by mouth 1 hour before bed  . levothyroxine (SYNTHROID) 25 MCG tablet Take 1 tablet (25 mcg total) by mouth daily before breakfast.  . montelukast (SINGULAIR) 10 MG tablet TAKE 1 TABLET BY MOUTH EVERYDAY AT BEDTIME  . Multiple Vitamin (MULTIVITAMIN WITH MINERALS) TABS tablet Take 1 tablet by mouth daily.  . Soft Lens Products (SALINE SENSITIVE EYES) SOLN Place 1-2 drops into both eyes 2 (two) times daily as needed.  . triamcinolone cream (KENALOG) 0.1 % Apply 1 application topically 2 (two) times daily as needed. Apply to arms, back, legs, and hands as needed; do not apply to face or groin     Past Surgical History:  Procedure Laterality Date  . CATARACT EXTRACTION, BILATERAL    . EXCISION OF RIGHT CHEST MASS  12-19-1999   BENIGN  . MASTECTOMY  1990  (APPROX.)   RIGHT BREAST CANCER -- NO RECURRENCE  . ORIF BILATERAL WRIST  2009  . SHOULDER ARTHROSCOPY  12/06/2011   Procedure: ARTHROSCOPY SHOULDER;  Surgeon: Magnus Sinning, MD;  Location: [facility];  Service: Orthopedics;  Laterality: Right;  WITH LABRIAL DEBRIDEMENT AND SAD INTERSCALINE BLOCK    Family History  Problem Relation Age of Onset  . Stroke Mother   . Rheumatic fever Father   . Arthritis Sister        back and knees  . Cancer Brother        prostate  . Arthritis Sister        back and knees // rheumatoid  . Breast cancer  Sister   . Cancer Sister        esophogeal  . Cancer Sister        lymphoma - non hodge.  . Dementia Sister   . Diabetes Sister   . Dementia Sister   . Arthritis Sister        hips  . Dementia Sister   . Dementia Sister     New complaints: None today  Social history: She lives with her neice  Controlled substance contract: n/a    Review of Systems  Constitutional: Negative for diaphoresis and weight loss.  Eyes: Negative for blurred vision, double vision and pain.   Respiratory: Negative for shortness of breath.   Cardiovascular: Negative for chest pain, palpitations, orthopnea and leg swelling.  Gastrointestinal: Negative for abdominal pain.  Skin: Negative for rash.  Neurological: Negative for dizziness, sensory change, loss of consciousness, weakness and headaches.  Endo/Heme/Allergies: Negative for polydipsia. Does not bruise/bleed easily.  Psychiatric/Behavioral: Negative for memory loss. The patient does not have insomnia.   All other systems reviewed and are negative.    Observations/Objective: Alert and oriented- answers all questions appropriately No distress    Assessment and Plan: Sarah Boyd comes in today with chief complaint of Medical Management of Chronic Issues   Diagnosis and orders addressed:  1. Hypothyroidism due to acquired atrophy of thyroid No problems - levothyroxine (SYNTHROID) 25 MCG tablet; Take 1 tablet (25 mcg total) by mouth daily before breakfast.  Dispense: 90 tablet; Refill: 1  2. Mild intermittent chronic asthma without complication - montelukast (SINGULAIR) 10 MG tablet; TAKE 1 TABLET BY MOUTH EVERYDAY AT BEDTIME  Dispense: 90 tablet; Refill: 1  3. Age-related osteoporosis without current pathological fracture Weight bearing exercise when can tolerate  4. Frequent falls Fall precautions   Labs pending Health Maintenance reviewed Diet and exercise encouraged  Follow up plan: 6 months    I discussed the assessment and treatment plan with the patient. The patient was provided an opportunity to ask questions and all were answered. The patient agreed with the plan and demonstrated an understanding of the instructions.   The patient was advised to call back or seek an in-person evaluation if the symptoms worsen or if the condition fails to improve as anticipated.  The above assessment and management plan was discussed with the patient. The patient verbalized understanding of and has agreed to  the management plan. Patient is aware to call the clinic if symptoms persist or worsen. Patient is aware when to return to the clinic for a follow-up visit. Patient educated on when it is appropriate to go to the emergency department.   Time call ended:  11:10  I provided 15 minutes of non-face-to-face time during this encounter.    Mary-Margaret Hassell Done, FNP

## 2019-07-24 ENCOUNTER — Other Ambulatory Visit: Payer: Self-pay

## 2019-07-24 ENCOUNTER — Ambulatory Visit
Admission: RE | Admit: 2019-07-24 | Discharge: 2019-07-24 | Disposition: A | Discharge status: Home / Self Care | Payer: Medicare HMO | Source: Ambulatory Visit | Attending: Nurse Practitioner | Admitting: Nurse Practitioner

## 2019-07-24 DIAGNOSIS — Z1231 Encounter for screening mammogram for malignant neoplasm of breast: Secondary | ICD-10-CM | POA: Diagnosis not present

## 2019-07-29 ENCOUNTER — Telehealth: Payer: Self-pay | Admitting: Nurse Practitioner

## 2019-08-06 DIAGNOSIS — L910 Hypertrophic scar: Secondary | ICD-10-CM | POA: Diagnosis not present

## 2019-08-06 DIAGNOSIS — L299 Pruritus, unspecified: Secondary | ICD-10-CM | POA: Diagnosis not present

## 2019-08-07 ENCOUNTER — Ambulatory Visit: Payer: Medicare HMO | Attending: Internal Medicine

## 2019-08-07 DIAGNOSIS — Z23 Encounter for immunization: Secondary | ICD-10-CM | POA: Insufficient documentation

## 2019-08-07 NOTE — Progress Notes (Signed)
   Covid-19 Vaccination Clinic  Name:  Sarah Boyd    MRN: [id number] DOB: 19-Aug-1929  08/07/2019  [name] was observed post Covid-19 immunization for 15 minutes without incidence. She was provided with Vaccine Information Sheet and instruction to access the V-Safe system.   [name] was instructed to call 911 with any severe reactions post vaccine: Marland Kitchen Difficulty breathing  . Swelling of your face and throat  . A fast heartbeat  . A bad rash all over your body  . Dizziness and weakness    Immunizations Administered    Name Date Dose VIS Date Route   Pfizer COVID-19 Vaccine 08/07/2019  9:15 AM 0.3 mL 05/23/2019 Intramuscular   Manufacturer: [place]   Lot: [id number]   [place]: [id number]

## 2019-08-14 ENCOUNTER — Other Ambulatory Visit: Payer: Self-pay | Admitting: Nurse Practitioner

## 2019-08-14 DIAGNOSIS — J4520 Mild intermittent asthma, uncomplicated: Secondary | ICD-10-CM

## 2019-08-14 NOTE — Telephone Encounter (Signed)
Medication not on current med list Last office visit 06/16/2019

## 2019-08-27 ENCOUNTER — Ambulatory Visit: Payer: Medicare HMO | Attending: Internal Medicine

## 2019-08-27 DIAGNOSIS — Z23 Encounter for immunization: Secondary | ICD-10-CM

## 2019-08-27 NOTE — Progress Notes (Signed)
   Covid-19 Vaccination Clinic  Name:  Sarah Boyd    MRN: [id number] DOB: 1929/10/31  08/27/2019  [name] was observed post Covid-19 immunization for 15 minutes without incident. She was provided with Vaccine Information Sheet and instruction to access the V-Safe system.   [name] was instructed to call 911 with any severe reactions post vaccine: Marland Kitchen Difficulty breathing  . Swelling of face and throat  . A fast heartbeat  . A bad rash all over body  . Dizziness and weakness   Immunizations Administered    Name Date Dose VIS Date Route   Pfizer COVID-19 Vaccine 08/27/2019 11:46 AM 0.3 mL 05/23/2019 Intramuscular   Manufacturer: [place]   Lot: [id number]   [place]: [id number]

## 2019-09-03 ENCOUNTER — Telehealth: Payer: Self-pay | Admitting: Nurse Practitioner

## 2019-09-03 DIAGNOSIS — R1310 Dysphagia, unspecified: Secondary | ICD-10-CM | POA: Diagnosis not present

## 2019-09-03 NOTE — Telephone Encounter (Signed)
We received the summary of benefits from [employer] and I spoke with Merry Proud, the [employer] rep.  He reports that patient's co-pay should be zero but that a prior authorization is required for the medication.  I submitted the prior authorization to Cover My Meds this morning and am waiting to hear back from them.  Advised Ms. Scales of this and informed her we will call them as soon as we receive a response from Cover My Meds.

## 2019-09-03 NOTE — Telephone Encounter (Signed)
Pts niece called stating that they are still waiting to hear back from someone regarding the Prolia shot for [facility]. Wants to be called back about this.

## 2019-09-08 ENCOUNTER — Ambulatory Visit (INDEPENDENT_AMBULATORY_CARE_PROVIDER_SITE_OTHER): Payer: Medicare HMO

## 2019-09-08 ENCOUNTER — Other Ambulatory Visit: Payer: Self-pay

## 2019-09-08 DIAGNOSIS — M810 Age-related osteoporosis without current pathological fracture: Secondary | ICD-10-CM | POA: Diagnosis not present

## 2019-09-08 MED ORDER — DENOSUMAB 60 MG/ML ~~LOC~~ SOSY
60.0000 mg | PREFILLED_SYRINGE | Freq: Once | SUBCUTANEOUS | Status: AC
  Administered 2019-09-08: 60 mg via SUBCUTANEOUS

## 2019-09-08 NOTE — Progress Notes (Signed)
Prolia injection given to left deltoid, patient tolerated well.  Buy & bill

## 2019-11-01 ENCOUNTER — Other Ambulatory Visit: Payer: Self-pay

## 2019-11-01 ENCOUNTER — Emergency Department (HOSPITAL_COMMUNITY)
Admission: EM | Admit: 2019-11-01 | Discharge: 2019-11-01 | Disposition: A | Discharge status: Home / Self Care | Payer: Medicare HMO | Attending: Emergency Medicine | Admitting: Emergency Medicine

## 2019-11-01 ENCOUNTER — Emergency Department (HOSPITAL_COMMUNITY): Payer: Medicare HMO

## 2019-11-01 ENCOUNTER — Encounter (HOSPITAL_COMMUNITY): Payer: Self-pay | Admitting: Emergency Medicine

## 2019-11-01 ENCOUNTER — Ambulatory Visit
Admission: EM | Admit: 2019-11-01 | Discharge: 2019-11-01 | Disposition: A | Discharge status: Home / Self Care | Payer: Medicare HMO | Source: Home / Self Care

## 2019-11-01 DIAGNOSIS — E039 Hypothyroidism, unspecified: Secondary | ICD-10-CM | POA: Diagnosis not present

## 2019-11-01 DIAGNOSIS — S3991XA Unspecified injury of abdomen, initial encounter: Secondary | ICD-10-CM | POA: Diagnosis not present

## 2019-11-01 DIAGNOSIS — M549 Dorsalgia, unspecified: Secondary | ICD-10-CM

## 2019-11-01 DIAGNOSIS — M545 Low back pain: Secondary | ICD-10-CM | POA: Diagnosis not present

## 2019-11-01 DIAGNOSIS — R109 Unspecified abdominal pain: Secondary | ICD-10-CM | POA: Diagnosis not present

## 2019-11-01 DIAGNOSIS — J45909 Unspecified asthma, uncomplicated: Secondary | ICD-10-CM | POA: Insufficient documentation

## 2019-11-01 DIAGNOSIS — S299XXA Unspecified injury of thorax, initial encounter: Secondary | ICD-10-CM | POA: Diagnosis not present

## 2019-11-01 DIAGNOSIS — W19XXXA Unspecified fall, initial encounter: Secondary | ICD-10-CM

## 2019-11-01 LAB — BASIC METABOLIC PANEL
Anion gap: 11 (ref 5–15)
BUN: 13 mg/dL (ref 8–23)
CO2: 26 mmol/L (ref 22–32)
Calcium: 9.1 mg/dL (ref 8.9–10.3)
Chloride: 102 mmol/L (ref 98–111)
Creatinine, Ser: 0.7 mg/dL (ref 0.44–1.00)
GFR calc Af Amer: >60 mL/min (ref >60)
GFR calc non Af Amer: >60 mL/min (ref >60)
Glucose, Bld: 90 mg/dL (ref 70–99)
Potassium: 4 mmol/L (ref 3.5–5.1)
Sodium: 139 mmol/L (ref 135–145)

## 2019-11-01 LAB — CBC
HCT: 45.1 % (ref 36.0–46.0)
Hemoglobin: 14.3 g/dL (ref 12.0–15.0)
MCH: 27.3 pg (ref 26.0–34.0)
MCHC: 31.7 g/dL (ref 30.0–36.0)
MCV: 86.2 fL (ref 80.0–100.0)
Platelets: 286 10*3/uL (ref 150–400)
RBC: 5.23 MIL/uL — ABNORMAL HIGH (ref 3.87–5.11)
RDW: 14.1 % (ref 11.5–15.5)
WBC: 9.1 10*3/uL (ref 4.0–10.5)
nRBC: 0 % (ref 0.0–0.2)

## 2019-11-01 LAB — I-STAT CREATININE, ED: Creatinine, Ser: 0.8 mg/dL (ref 0.44–1.00)

## 2019-11-01 MED ORDER — LIDOCAINE 5 % EX PTCH: 1.0000 | MEDICATED_PATCH | CUTANEOUS | 0 refills | Status: AC

## 2019-11-01 MED ORDER — TETANUS-DIPHTH-ACELL PERTUSSIS 5-2.5-18.5 LF-MCG/0.5 IM SUSP
0.5000 mL | Freq: Once | INTRAMUSCULAR | Status: AC
  Administered 2019-11-01: 0.5 mL via INTRAMUSCULAR
  Filled 2019-11-01: qty 0.5

## 2019-11-01 MED ORDER — IOHEXOL 300 MG/ML  SOLN
75.0000 mL | Freq: Once | INTRAMUSCULAR | Status: AC | PRN
  Administered 2019-11-01: 75 mL via INTRAVENOUS

## 2019-11-01 MED ORDER — HYDROCODONE-ACETAMINOPHEN 5-325 MG PO TABS
1.0000 | ORAL_TABLET | Freq: Once | ORAL | Status: AC
  Administered 2019-11-01: 1 via ORAL
  Filled 2019-11-01: qty 1

## 2019-11-01 MED ORDER — FENTANYL CITRATE (PF) 100 MCG/2ML IJ SOLN
50.0000 ug | Freq: Once | INTRAMUSCULAR | Status: AC
  Administered 2019-11-01: 50 ug via INTRAVENOUS
  Filled 2019-11-01: qty 2

## 2019-11-01 NOTE — ED Provider Notes (Signed)
Patient signed out to me by Kennith Maes, PA-C pending completion of work-up and reassessment.  Patient is a 86 year old female here for evaluation of left mid back and flank pain secondary to a mechanical fall that happened earlier today.  On my exam, she does have left thoracic and lumbar paraspinal tenderness without midline tenderness.  The T12 compression fx seen on imaging is felt to be non acute.    CT of the abdomen pelvis does show some right-sided bladder wall thickening that is of unclear etiology.  Patient denies urinary symptoms.  I have explained findings with patient and caregiver, and she agrees to arrange close follow-up with urology.  She appears appropriate for discharge home, pain is improved.  Prescription written for Lidoderm patches.  Return precautions also discussed.   Pt also seen by Dr. Roderic Palau and care plan discussed.     DG Ribs Unilateral W/Chest Left  Result Date: 11/01/2019 CLINICAL DATA:  Pain after fall EXAM: LEFT RIBS AND CHEST - 3+ VIEW COMPARISON:  May 14, 2015 FINDINGS: No pneumothorax. Loss of height of T12, new since 2016. No rib fractures are noted. Bibasilar pulmonary opacities, mild. No other acute abnormalities. IMPRESSION: 1. Loss of height of T12, new since 2016, consistent with an age indeterminate compression fracture. Acute compression fracture is not excluded on this study. 2. No rib fracture or pneumothorax. 3. Bibasilar opacities may represent atelectasis or infiltrate. Atelectasis favored. Electronically Signed   By: Dorise Bullion III M.D   On: 11/01/2019 14:26   CT Abdomen Pelvis W Contrast  Result Date: 11/01/2019 CLINICAL DATA:  Left mid back pain.  Flank pain.  Fall. EXAM: CT ABDOMEN AND PELVIS WITH CONTRAST TECHNIQUE: Multidetector CT imaging of the abdomen and pelvis was performed using the standard protocol following bolus administration of intravenous contrast. CONTRAST:  59mL OMNIPAQUE IOHEXOL 300 MG/ML  SOLN COMPARISON:   None. FINDINGS: Lower chest: Platelike opacities in the bases are favored represent atelectasis. Infiltrate considered less likely. The patient is status post right mastectomy. Tiny left pleural effusion. No other abnormalities in the lung bases. Hepatobiliary: No focal liver abnormality is seen. No gallstones, gallbladder wall thickening, or biliary dilatation. Pancreas: Unremarkable. No pancreatic ductal dilatation or surrounding inflammatory changes. Spleen: Normal in size without focal abnormality. Adrenals/Urinary Tract: Scattered renal cysts. No suspicious renal masses, stones, or hydronephrosis. Ureters are normal. Suggested mild thickening of the bladder wall to the right seen on coronal image 36 and axial image 63. The bladder is distended. No other bladder abnormalities. Stomach/Bowel: The stomach and small bowel are normal. Mild fecal loading in the ascending colon. The appendix is normal in appearance. Vascular/Lymphatic: No significant vascular findings are present. No enlarged abdominal or pelvic lymph nodes. Reproductive: Uterus and bilateral adnexa are unremarkable. Other: No abdominal wall hernia or abnormality. No abdominopelvic ascites. Musculoskeletal: Healed bilateral pubic ramus fractures. Compression fracture of T12 with 50% loss of height. The fracture is age indeterminate on this study but may be nonacute. The fracture is new since 2018. No extension into the posterior elements. No adjacent soft tissue swelling. IMPRESSION: 1. Compression fracture of T12 with 50% loss of height, age indeterminate on this study. There is no adjacent soft tissue swelling identified and the finding may be nonacute. Recommend clinical correlation. MRI could further assess acuity if there is concern. 2. Platelike opacities in the lung bases are favored represent atelectasis with infiltrate considered less likely. 3. Suggested mild thickening of the right bladder wall as above. Direct visualization could  better  visualized. 4. No other acute abnormalities. Electronically Signed   By: Dorise Bullion III M.D   On: 11/01/2019 17:10      Heidi Maclin, Lynelle Smoke, PA-C 11/01/19 Colon Flattery, MD 11/02/19 [id number]

## 2019-11-01 NOTE — ED Triage Notes (Signed)
Patient c/o left mid back pain and flank pain after falling today. Per patient tripped and caught herself on the refrigerator door hitting her left side. Denies hitting head or LOC. Denies taking any type of anticoagulant.

## 2019-11-01 NOTE — Discharge Instructions (Addendum)
The CT of your abdomen and pelvis shows a compression fracture of her mid to lower spine that appears to be old and likely not related to her recent fall. No evidence of any new bony injuries.  Her CT today did show some thickening of her bladder that will need further evaluation by a urologist.  I have listed Dr. Ralene Muskrat info.  You can call his office to make a follow-up appt about this.  Also, contact her primary doctor to schedule a recheck in a few days.

## 2019-11-01 NOTE — ED Notes (Signed)
RT in to educate pt and family on uses of IS.

## 2019-11-01 NOTE — ED Provider Notes (Signed)
[facility] EMERGENCY DEPARTMENT Provider Note   CSN: [id number] Arrival date & time: 11/01/19  1125     History Chief Complaint  Patient presents with  . Fall    Sarah Boyd is a 86 y.o. female with a history of osteoporosis, hypothyroidism, and frequent falls who presents to the emergency department with complaints of left rib pain s/p fall yesterday. Patient states she tripped and caught herself on the refrigerator door striking her left mid back. She denies head injury or LOC. States since injury she has had pain to the L mid back, constant, worse with movement & certain positions, no alleviating factors. Tried tylenol without much change.  She denies headache, neck pain, chest pain in the front, abdominal pain, dyspnea, or hemoptysis.  She denies anticoagulation use.  HPI     Past Medical History:  Diagnosis Date  . Allergy   . Asthma   . Cancer ([place])    Breast CA, Rt Mastectomy  . History of breast cancer S/P RIGHT MASTECTOMY AND CHEMO 21 YRS AGO (APPROX.  1990)   NO RECURRENCE  . HOH (hard of hearing) NO AIDS  . Hypothyroidism   . Labral tear of shoulder RIGHT SHOULDER  . Osteoporosis   . Right shoulder pain     Patient Active Problem List   Diagnosis Date Noted  . Frequent falls 04/15/2015  . Asthma, chronic 12/05/2012  . Osteoporosis 12/05/2012  . Hypothyroidism 12/05/2012    Past Surgical History:  Procedure Laterality Date  . CATARACT EXTRACTION, BILATERAL    . EXCISION OF RIGHT CHEST MASS  12-19-1999   BENIGN  . MASTECTOMY  1990  (APPROX.)   RIGHT BREAST CANCER -- NO RECURRENCE  . ORIF BILATERAL WRIST  2009  . SHOULDER ARTHROSCOPY  12/06/2011   Procedure: ARTHROSCOPY SHOULDER;  Surgeon: Magnus Sinning, MD;  Location: [facility];  Service: Orthopedics;  Laterality: Right;  WITH LABRIAL DEBRIDEMENT AND SAD INTERSCALINE BLOCK     OB History    Gravida      Para      Term      Preterm      AB      Living  0     SAB        TAB      Ectopic      Multiple      Live Births              Family History  Problem Relation Age of Onset  . Stroke Mother   . Rheumatic fever Father   . Arthritis Sister        back and knees  . Cancer Brother        prostate  . Arthritis Sister        back and knees // rheumatoid  . Breast cancer Sister   . Cancer Sister        esophogeal  . Cancer Sister        lymphoma - non hodge.  . Dementia Sister   . Diabetes Sister   . Dementia Sister   . Arthritis Sister        hips  . Dementia Sister   . Dementia Sister     Social History   Tobacco Use  . Smoking status: Never Smoker  . Smokeless tobacco: Never Used  Substance Use Topics  . Alcohol use: No  . Drug use: No    Home Medications Prior to Admission medications  Medication Sig Start Date End Date Taking? Authorizing Provider  Calcium Carbonate-Vitamin D (CALCIUM 600+D) 600-400 MG-UNIT tablet Take 1 tablet by mouth daily.    [provider]  cetirizine (ZYRTEC) 10 MG tablet Take 1 tablet (10 mg total) by mouth at bedtime. 08/13/17   Chevis Pretty, FNP  hydrOXYzine (ATARAX/VISTARIL) 10 MG tablet Take 1 tablet by mouth 1 hour before bed 07/03/17   Chevis Pretty, FNP  levothyroxine (SYNTHROID) 25 MCG tablet Take 1 tablet (25 mcg total) by mouth daily before breakfast. 06/16/19   Hassell Done, Mary-Margaret, FNP  montelukast (SINGULAIR) 10 MG tablet TAKE 1 TABLET BY MOUTH EVERYDAY AT BEDTIME 06/16/19   Chevis Pretty, FNP  Multiple Vitamin (MULTIVITAMIN WITH MINERALS) TABS tablet Take 1 tablet by mouth daily.    [provider]  Soft Lens Products (SALINE SENSITIVE EYES) SOLN Place 1-2 drops into both eyes 2 (two) times daily as needed.    [provider]  triamcinolone cream (KENALOG) 0.1 % Apply 1 application topically 2 (two) times daily as needed. Apply to arms, back, legs, and hands as needed; do not apply to face or groin 11/22/16   [provider]   Grant Ruts INHUB 100-50 MCG/DOSE AEPB TAKE 1 PUFF BY MOUTH TWICE A DAY 08/14/19   Hassell Done Mary-Margaret, FNP    Allergies    Chocolate and Benadryl [diphenhydramine hcl]  Review of Systems   Review of Systems  Constitutional: Negative for chills and fever.  Respiratory: Negative for cough and shortness of breath.   Cardiovascular: Negative for chest pain.  Gastrointestinal: Negative for abdominal pain, nausea and vomiting.  Musculoskeletal: Positive for back pain.  Neurological: Negative for syncope.  All other systems reviewed and are negative.   Physical Exam Updated Vital Signs BP 117/77 (BP Location: Right Arm)   Pulse 80   Temp 98.2 F (36.8 C) (Oral)   Resp 16   Ht 5\' 1"  (1.549 m)   Wt 44.9 kg   SpO2 95%   BMI 18.71 kg/m   Physical Exam Vitals and nursing note reviewed.  Constitutional:      Appearance: She is well-developed. She is not toxic-appearing.  HENT:     Head: Normocephalic and atraumatic. No raccoon eyes or Battle's sign.     Right Ear: No hemotympanum.     Left Ear: No hemotympanum.  Eyes:     General:        Right eye: No discharge.        Left eye: No discharge.     Conjunctiva/sclera: Conjunctivae normal.     Pupils: Pupils are equal, round, and reactive to light.  Neck:     Comments: No midline tenderness. Cardiovascular:     Rate and Rhythm: Normal rate and regular rhythm.     Heart sounds: No murmur.  Pulmonary:     Effort: No respiratory distress.     Breath sounds: Normal breath sounds. No wheezing or rales.  Chest:     Chest wall: Tenderness (Left posterior lower chest wall tenderness to palpation which extends to the mid lateral chest wall consistent with lower rib area) present.  Abdominal:     General: There is no distension.     Palpations: Abdomen is soft.     Tenderness: There is no abdominal tenderness. There is no guarding or rebound.  Musculoskeletal:     Cervical back: Normal range of motion. No spinous process tenderness.      Comments: Upper/lower extremities: Intact active range of motion.  No focal  bony tenderness Back: No midline tenderness or palpable step-off.  Left lower thoracic and lumbar paraspinal muscles are tender to palpation.  There is a small abrasion to the left lumbar paraspinal muscle region with surrounding very mild bruising but no significant flank ecchymosis.   Skin:    General: Skin is warm and dry.     Findings: No rash.  Neurological:     Comments: Alert.  Clear speech.  Sensation and strength grossly intact bilateral upper and lower extremities.  Psychiatric:        Behavior: Behavior normal.     ED Results / Procedures / Treatments   Labs (all labs ordered are listed, but only abnormal results are displayed) Labs Reviewed - No data to display  EKG None  Radiology DG Ribs Unilateral W/Chest Left  Result Date: 11/01/2019 CLINICAL DATA:  Pain after fall EXAM: LEFT RIBS AND CHEST - 3+ VIEW COMPARISON:  May 14, 2015 FINDINGS: No pneumothorax. Loss of height of T12, new since 2016. No rib fractures are noted. Bibasilar pulmonary opacities, mild. No other acute abnormalities. IMPRESSION: 1. Loss of height of T12, new since 2016, consistent with an age indeterminate compression fracture. Acute compression fracture is not excluded on this study. 2. No rib fracture or pneumothorax. 3. Bibasilar opacities may represent atelectasis or infiltrate. Atelectasis favored. Electronically Signed   By: Dorise Bullion III M.D   On: 11/01/2019 14:26    Procedures Procedures (including critical care time)  Medications Ordered in ED Medications  HYDROcodone-acetaminophen (NORCO/VICODIN) 5-325 MG per tablet 1 tablet (1 tablet Oral Given 11/01/19 1430)  Tdap (BOOSTRIX) injection 0.5 mL (0.5 mLs Intramuscular Given 11/01/19 1549)  fentaNYL (SUBLIMAZE) injection 50 mcg (50 mcg Intravenous Given 11/01/19 1546)    ED Course  I have reviewed the triage vital signs and the nursing notes.  Pertinent  labs & imaging results that were available during my care of the patient were reviewed by me and considered in my medical decision making (see chart for details).    MDM Rules/Calculators/A&P                     Patient presents to the emergency department status post mechanical fall with complaints of left-sided back pain.  Patient is nontoxic vitals within normal limits.  She does appear somewhat uncomfortable, but not in significant acute distress.  She is a mild abrasion to the left lower back area does not appear to require repair with suture/staple/skin adhesive.  Tetanus to be updated.  Initially left-sided rib x-ray/chest x-ray was obtained which was negative for any rib fractures, there is a new finding at [id number] from [id number], uncertain timing of compression fracture.  Given patient's tenderness we will proceed with CT abdomen/pelvis to further assess for lower rib fracture as well as intra-abdominal injury.  Fentanyl ordered for pain.  Basic labs ordered to assess kidney function for imaging.  I-stat creatinine: WNL CBC: No significant anemia.  BMP: No significant abnormalities.   This is a shared visit with supervising physician Dr. Roderic Palau who has independently evaluated patient & provided guidance in evaluation/management/disposition, in agreement with care   16:15: Patient care signed out to Tammy Tripplett PA-C at change of shift pending CT & disposition, if not significant injuries anticipate discharge with analgesics. Incentive spirometry ordered for patient to take home if able to be discharged.   Final Clinical Impression(s) / ED Diagnoses Final diagnoses:  Fall, initial encounter  Acute left-sided back pain, unspecified back location  Rx / DC Orders ED Discharge Orders    None       Amaryllis Dyke, PA-C 11/01/19 1612    Fredia Sorrow, MD 12/02/19 2234

## 2019-11-17 ENCOUNTER — Other Ambulatory Visit: Payer: Self-pay

## 2019-11-17 ENCOUNTER — Ambulatory Visit (INDEPENDENT_AMBULATORY_CARE_PROVIDER_SITE_OTHER): Payer: Medicare HMO | Admitting: Nurse Practitioner

## 2019-11-17 ENCOUNTER — Encounter: Payer: Self-pay | Admitting: Nurse Practitioner

## 2019-11-17 VITALS — BP 129/71 | HR 87 | Temp 98.1°F | Resp 20 | Ht 61.0 in | Wt 91.0 lb

## 2019-11-17 DIAGNOSIS — J4520 Mild intermittent asthma, uncomplicated: Secondary | ICD-10-CM | POA: Diagnosis not present

## 2019-11-17 DIAGNOSIS — R296 Repeated falls: Secondary | ICD-10-CM

## 2019-11-17 DIAGNOSIS — M810 Age-related osteoporosis without current pathological fracture: Secondary | ICD-10-CM

## 2019-11-17 DIAGNOSIS — E034 Atrophy of thyroid (acquired): Secondary | ICD-10-CM

## 2019-11-17 MED ORDER — LEVOTHYROXINE SODIUM 25 MCG PO TABS: 25.0000 ug | ORAL_TABLET | Freq: Every day | ORAL | 1 refills | Status: DC

## 2019-11-17 MED ORDER — CETIRIZINE HCL 10 MG PO TABS: 10.0000 mg | ORAL_TABLET | Freq: Every day | ORAL | 1 refills | Status: DC

## 2019-11-17 MED ORDER — HYDROXYZINE HCL 10 MG PO TABS: ORAL_TABLET | ORAL | 0 refills | Status: DC

## 2019-11-17 MED ORDER — MONTELUKAST SODIUM 10 MG PO TABS: ORAL_TABLET | ORAL | 1 refills | Status: DC

## 2019-11-17 NOTE — Patient Instructions (Signed)

## 2019-11-17 NOTE — Addendum Note (Signed)
Addended by: Chevis Pretty on: 11/17/2019 02:44 PM   Modules accepted: Orders

## 2019-11-17 NOTE — Progress Notes (Addendum)
Subjective:    Patient ID: Sarah Boyd, female    DOB: 01-30-1930, 86 y.o.   MRN: [id number]   Chief Complaint: Medical Management of Chronic Issues    HPI:  1. Mild intermittent chronic asthma without complication Taking vistaril & zyrtec daily, denies daily singulair because Sarah Boyd does not like it as much as her albuterol inhaler. Denies SOB or other issues with asthma.   2. Hypothyroidism due to acquired atrophy of thyroid No new complaints or symptoms. TSH repeated today. Lab Results  Component Value Date   TSH 2.390 12/12/2018     3. Age-related osteoporosis without current pathological fracture Last done density test 02/26/17, T-score -3.4. Denies any new sx or complaints related to diagnosis.  4. Frequent falls 1 recent fall from standing onto case of water on floor, was seen at [facility] after event. Denies dizziness or LOC. Will make referral to PT.    Outpatient Encounter Medications as of 11/17/2019  Medication Sig  . Calcium Carbonate-Vitamin D (CALCIUM 600+D) 600-400 MG-UNIT tablet Take 1 tablet by mouth daily.  . cetirizine (ZYRTEC) 10 MG tablet Take 1 tablet (10 mg total) by mouth at bedtime.  . hydrOXYzine (ATARAX/VISTARIL) 10 MG tablet Take 1 tablet by mouth 1 hour before bed  . levothyroxine (SYNTHROID) 25 MCG tablet Take 1 tablet (25 mcg total) by mouth daily before breakfast.  . lidocaine (LIDODERM) 5 % Place 1 patch onto the skin daily. Remove & Discard patch within 12 hours or as directed by MD  . montelukast (SINGULAIR) 10 MG tablet TAKE 1 TABLET BY MOUTH EVERYDAY AT BEDTIME  . Multiple Vitamin (MULTIVITAMIN WITH MINERALS) TABS tablet Take 1 tablet by mouth daily.  . Soft Lens Products (SALINE SENSITIVE EYES) SOLN Place 1-2 drops into both eyes 2 (two) times daily as needed.  . triamcinolone cream (KENALOG) 0.1 % Apply 1 application topically 2 (two) times daily as needed. Apply to arms, back, legs, and hands as needed; do not apply to face or groin  .  WIXELA INHUB 100-50 MCG/DOSE AEPB TAKE 1 PUFF BY MOUTH TWICE A DAY   No facility-administered encounter medications on file as of 11/17/2019.    Past Surgical History:  Procedure Laterality Date  . CATARACT EXTRACTION, BILATERAL    . EXCISION OF RIGHT CHEST MASS  12-19-1999   BENIGN  . MASTECTOMY  1990  (APPROX.)   RIGHT BREAST CANCER -- NO RECURRENCE  . ORIF BILATERAL WRIST  2009  . SHOULDER ARTHROSCOPY  12/06/2011   Procedure: ARTHROSCOPY SHOULDER;  Surgeon: Magnus Sinning, MD;  Location: [facility];  Service: Orthopedics;  Laterality: Right;  WITH LABRIAL DEBRIDEMENT AND SAD INTERSCALINE BLOCK    Family History  Problem Relation Age of Onset  . Stroke Mother   . Rheumatic fever Father   . Arthritis Sister        back and knees  . Cancer Brother        prostate  . Arthritis Sister        back and knees // rheumatoid  . Breast cancer Sister   . Cancer Sister        esophogeal  . Cancer Sister        lymphoma - non hodge.  . Dementia Sister   . Diabetes Sister   . Dementia Sister   . Arthritis Sister        hips  . Dementia Sister   . Dementia Sister     New complaints: No new  complaints today.  Social history: Lives with niece, attends church. Ambulates with walker & performs ADL mostly independently.   Controlled substance contract: n/a     Review of Systems  Constitutional: Negative for diaphoresis.  Eyes: Negative for pain.  Respiratory: Negative for shortness of breath.   Cardiovascular: Negative for chest pain, palpitations and leg swelling.  Gastrointestinal: Negative for abdominal pain.  Endocrine: Negative for polydipsia.  Skin: Negative for rash.  Neurological: Negative for dizziness, weakness and headaches.  Hematological: Does not bruise/bleed easily.  All other systems reviewed and are negative.      Objective:   Physical Exam Constitutional:      Appearance: Normal appearance.  HENT:     Head: Normocephalic.      Right Ear: Tympanic membrane normal.     Left Ear: Tympanic membrane normal.     Nose: Nose normal.     Mouth/Throat:     Mouth: Mucous membranes are dry.     Pharynx: Oropharynx is clear.  Eyes:     Extraocular Movements: Extraocular movements intact.     Pupils: Pupils are equal, round, and reactive to light.  Cardiovascular:     Rate and Rhythm: Normal rate and regular rhythm.     Pulses: Normal pulses.     Heart sounds: Normal heart sounds.  Pulmonary:     Effort: Pulmonary effort is normal.     Breath sounds: Normal breath sounds.  Abdominal:     General: Bowel sounds are normal.     Palpations: Abdomen is soft.  Musculoskeletal:        General: Normal range of motion.     Cervical back: Normal range of motion and neck supple.  Skin:    General: Skin is warm and dry.     Capillary Refill: Capillary refill takes less than 2 seconds.  Neurological:     General: No focal deficit present.     Mental Status: Sarah Boyd is alert and oriented to person, place, and time. Mental status is at baseline.  Psychiatric:        Mood and Affect: Mood normal.        Behavior: Behavior normal.    BP 129/71   Pulse 87   Temp 98.1 F (36.7 C) (Temporal)   Resp 20   Ht 5\' 1"  (1.549 m)   Wt 91 lb (41.3 kg)   SpO2 92%   BMI 17.19 kg/m         Assessment & Plan:  Sarah Boyd comes in today with chief complaint of Medical Management of Chronic Issues   Diagnosis and orders addressed:  1. Mild intermittent chronic asthma without complication Continue taking prescribed medications, avoid allergic triggers.  2. Hypothyroidism due to acquired atrophy of thyroid Continue taking thyroid medication.  3. Age-related osteoporosis without current pathological fracture Please schedule a bone-density test. This should be done every 2 years and your last test was done in 2018.  4. Frequent falls Use walker to ambulate, take your time getting up from a lying or sitting position when  standing. Drink plenty of fluids to prevent dehydration.   You have been referred to home physical therapy.   Labs pending Health Maintenance reviewed Diet and exercise encouraged  Follow up plan: 6 month   [place], FNP

## 2019-11-18 LAB — THYROID PANEL WITH TSH
Free Thyroxine Index: 1.7 (ref 1.2–4.9)
T3 Uptake Ratio: 25 % (ref 24–39)
T4, Total: 6.6 ug/dL (ref 4.5–12.0)
TSH: 1.98 u[IU]/mL (ref 0.450–4.500)

## 2019-11-20 DIAGNOSIS — E039 Hypothyroidism, unspecified: Secondary | ICD-10-CM | POA: Diagnosis not present

## 2019-11-20 DIAGNOSIS — R2681 Unsteadiness on feet: Secondary | ICD-10-CM | POA: Diagnosis not present

## 2019-11-20 DIAGNOSIS — J4520 Mild intermittent asthma, uncomplicated: Secondary | ICD-10-CM | POA: Diagnosis not present

## 2019-11-20 DIAGNOSIS — M810 Age-related osteoporosis without current pathological fracture: Secondary | ICD-10-CM | POA: Diagnosis not present

## 2019-11-20 DIAGNOSIS — R296 Repeated falls: Secondary | ICD-10-CM | POA: Diagnosis not present

## 2019-11-24 DIAGNOSIS — E039 Hypothyroidism, unspecified: Secondary | ICD-10-CM | POA: Diagnosis not present

## 2019-11-24 DIAGNOSIS — R2681 Unsteadiness on feet: Secondary | ICD-10-CM | POA: Diagnosis not present

## 2019-11-24 DIAGNOSIS — R296 Repeated falls: Secondary | ICD-10-CM | POA: Diagnosis not present

## 2019-11-24 DIAGNOSIS — M810 Age-related osteoporosis without current pathological fracture: Secondary | ICD-10-CM | POA: Diagnosis not present

## 2019-11-24 DIAGNOSIS — S72001A Fracture of unspecified part of neck of right femur, initial encounter for closed fracture: Secondary | ICD-10-CM | POA: Diagnosis not present

## 2019-11-24 DIAGNOSIS — R269 Unspecified abnormalities of gait and mobility: Secondary | ICD-10-CM | POA: Diagnosis not present

## 2019-11-24 DIAGNOSIS — J4520 Mild intermittent asthma, uncomplicated: Secondary | ICD-10-CM | POA: Diagnosis not present

## 2019-11-26 DIAGNOSIS — R296 Repeated falls: Secondary | ICD-10-CM | POA: Diagnosis not present

## 2019-11-26 DIAGNOSIS — J4520 Mild intermittent asthma, uncomplicated: Secondary | ICD-10-CM | POA: Diagnosis not present

## 2019-11-26 DIAGNOSIS — M810 Age-related osteoporosis without current pathological fracture: Secondary | ICD-10-CM | POA: Diagnosis not present

## 2019-11-26 DIAGNOSIS — E039 Hypothyroidism, unspecified: Secondary | ICD-10-CM | POA: Diagnosis not present

## 2019-11-26 DIAGNOSIS — R2681 Unsteadiness on feet: Secondary | ICD-10-CM | POA: Diagnosis not present

## 2019-11-27 ENCOUNTER — Other Ambulatory Visit: Payer: Self-pay

## 2019-11-27 ENCOUNTER — Ambulatory Visit (INDEPENDENT_AMBULATORY_CARE_PROVIDER_SITE_OTHER): Payer: Medicare HMO

## 2019-11-27 DIAGNOSIS — R2681 Unsteadiness on feet: Secondary | ICD-10-CM | POA: Diagnosis not present

## 2019-11-27 DIAGNOSIS — M810 Age-related osteoporosis without current pathological fracture: Secondary | ICD-10-CM

## 2019-11-27 DIAGNOSIS — J4520 Mild intermittent asthma, uncomplicated: Secondary | ICD-10-CM | POA: Diagnosis not present

## 2019-11-27 DIAGNOSIS — E039 Hypothyroidism, unspecified: Secondary | ICD-10-CM | POA: Diagnosis not present

## 2019-11-27 DIAGNOSIS — R296 Repeated falls: Secondary | ICD-10-CM

## 2019-12-03 DIAGNOSIS — R1310 Dysphagia, unspecified: Secondary | ICD-10-CM | POA: Diagnosis not present

## 2019-12-04 DIAGNOSIS — R296 Repeated falls: Secondary | ICD-10-CM | POA: Diagnosis not present

## 2019-12-04 DIAGNOSIS — M810 Age-related osteoporosis without current pathological fracture: Secondary | ICD-10-CM | POA: Diagnosis not present

## 2019-12-04 DIAGNOSIS — J4520 Mild intermittent asthma, uncomplicated: Secondary | ICD-10-CM | POA: Diagnosis not present

## 2019-12-04 DIAGNOSIS — R2681 Unsteadiness on feet: Secondary | ICD-10-CM | POA: Diagnosis not present

## 2019-12-04 DIAGNOSIS — E039 Hypothyroidism, unspecified: Secondary | ICD-10-CM | POA: Diagnosis not present

## 2019-12-09 DIAGNOSIS — R2681 Unsteadiness on feet: Secondary | ICD-10-CM | POA: Diagnosis not present

## 2019-12-09 DIAGNOSIS — R296 Repeated falls: Secondary | ICD-10-CM | POA: Diagnosis not present

## 2019-12-09 DIAGNOSIS — J4520 Mild intermittent asthma, uncomplicated: Secondary | ICD-10-CM | POA: Diagnosis not present

## 2019-12-09 DIAGNOSIS — M810 Age-related osteoporosis without current pathological fracture: Secondary | ICD-10-CM | POA: Diagnosis not present

## 2019-12-09 DIAGNOSIS — E039 Hypothyroidism, unspecified: Secondary | ICD-10-CM | POA: Diagnosis not present

## 2019-12-11 DIAGNOSIS — R296 Repeated falls: Secondary | ICD-10-CM | POA: Diagnosis not present

## 2019-12-11 DIAGNOSIS — R2681 Unsteadiness on feet: Secondary | ICD-10-CM | POA: Diagnosis not present

## 2019-12-11 DIAGNOSIS — E039 Hypothyroidism, unspecified: Secondary | ICD-10-CM | POA: Diagnosis not present

## 2019-12-11 DIAGNOSIS — M810 Age-related osteoporosis without current pathological fracture: Secondary | ICD-10-CM | POA: Diagnosis not present

## 2019-12-11 DIAGNOSIS — J4520 Mild intermittent asthma, uncomplicated: Secondary | ICD-10-CM | POA: Diagnosis not present

## 2019-12-17 DIAGNOSIS — R2681 Unsteadiness on feet: Secondary | ICD-10-CM | POA: Diagnosis not present

## 2019-12-17 DIAGNOSIS — E039 Hypothyroidism, unspecified: Secondary | ICD-10-CM | POA: Diagnosis not present

## 2019-12-17 DIAGNOSIS — M810 Age-related osteoporosis without current pathological fracture: Secondary | ICD-10-CM | POA: Diagnosis not present

## 2019-12-17 DIAGNOSIS — R296 Repeated falls: Secondary | ICD-10-CM | POA: Diagnosis not present

## 2019-12-17 DIAGNOSIS — J4520 Mild intermittent asthma, uncomplicated: Secondary | ICD-10-CM | POA: Diagnosis not present

## 2019-12-23 DIAGNOSIS — M810 Age-related osteoporosis without current pathological fracture: Secondary | ICD-10-CM | POA: Diagnosis not present

## 2019-12-23 DIAGNOSIS — E039 Hypothyroidism, unspecified: Secondary | ICD-10-CM | POA: Diagnosis not present

## 2019-12-23 DIAGNOSIS — J4520 Mild intermittent asthma, uncomplicated: Secondary | ICD-10-CM | POA: Diagnosis not present

## 2019-12-23 DIAGNOSIS — R296 Repeated falls: Secondary | ICD-10-CM | POA: Diagnosis not present

## 2019-12-23 DIAGNOSIS — R2681 Unsteadiness on feet: Secondary | ICD-10-CM | POA: Diagnosis not present

## 2020-01-02 DIAGNOSIS — R1310 Dysphagia, unspecified: Secondary | ICD-10-CM | POA: Diagnosis not present

## 2020-02-02 DIAGNOSIS — R1310 Dysphagia, unspecified: Secondary | ICD-10-CM | POA: Diagnosis not present

## 2020-02-02 DIAGNOSIS — E859 Amyloidosis, unspecified: Secondary | ICD-10-CM | POA: Diagnosis not present

## 2020-04-21 ENCOUNTER — Other Ambulatory Visit: Payer: Self-pay | Admitting: Nurse Practitioner

## 2020-04-21 DIAGNOSIS — J4520 Mild intermittent asthma, uncomplicated: Secondary | ICD-10-CM

## 2020-05-10 DIAGNOSIS — H43813 Vitreous degeneration, bilateral: Secondary | ICD-10-CM | POA: Diagnosis not present

## 2020-05-10 DIAGNOSIS — H04123 Dry eye syndrome of bilateral lacrimal glands: Secondary | ICD-10-CM | POA: Diagnosis not present

## 2020-05-10 DIAGNOSIS — H52203 Unspecified astigmatism, bilateral: Secondary | ICD-10-CM | POA: Diagnosis not present

## 2020-05-10 DIAGNOSIS — H16103 Unspecified superficial keratitis, bilateral: Secondary | ICD-10-CM | POA: Diagnosis not present

## 2020-05-18 ENCOUNTER — Ambulatory Visit (INDEPENDENT_AMBULATORY_CARE_PROVIDER_SITE_OTHER): Payer: Medicare HMO | Admitting: Nurse Practitioner

## 2020-05-18 ENCOUNTER — Encounter: Payer: Self-pay | Admitting: Nurse Practitioner

## 2020-05-18 ENCOUNTER — Other Ambulatory Visit: Payer: Self-pay

## 2020-05-18 ENCOUNTER — Ambulatory Visit (INDEPENDENT_AMBULATORY_CARE_PROVIDER_SITE_OTHER): Payer: Medicare HMO

## 2020-05-18 VITALS — BP 115/62 | HR 83 | Temp 98.1°F | Resp 20 | Ht 61.0 in | Wt 95.0 lb

## 2020-05-18 DIAGNOSIS — J4520 Mild intermittent asthma, uncomplicated: Secondary | ICD-10-CM | POA: Diagnosis not present

## 2020-05-18 DIAGNOSIS — Z23 Encounter for immunization: Secondary | ICD-10-CM | POA: Diagnosis not present

## 2020-05-18 DIAGNOSIS — E034 Atrophy of thyroid (acquired): Secondary | ICD-10-CM | POA: Diagnosis not present

## 2020-05-18 DIAGNOSIS — M810 Age-related osteoporosis without current pathological fracture: Secondary | ICD-10-CM | POA: Diagnosis not present

## 2020-05-18 DIAGNOSIS — R296 Repeated falls: Secondary | ICD-10-CM | POA: Diagnosis not present

## 2020-05-18 MED ORDER — FLUTICASONE-SALMETEROL 100-50 MCG/DOSE IN AEPB: INHALATION_SPRAY | RESPIRATORY_TRACT | 1 refills | Status: DC

## 2020-05-18 MED ORDER — CETIRIZINE HCL 10 MG PO TABS: 10.0000 mg | ORAL_TABLET | Freq: Every day | ORAL | 1 refills | Status: DC

## 2020-05-18 MED ORDER — MONTELUKAST SODIUM 10 MG PO TABS: ORAL_TABLET | ORAL | 1 refills | Status: DC

## 2020-05-18 MED ORDER — LEVOTHYROXINE SODIUM 25 MCG PO TABS: 25.0000 ug | ORAL_TABLET | Freq: Every day | ORAL | 1 refills | Status: DC

## 2020-05-18 NOTE — Patient Instructions (Signed)

## 2020-05-18 NOTE — Progress Notes (Signed)
Subjective:    Patient ID: Sarah Boyd, female    DOB: Oct 29, 1929, 86 y.o.   MRN: [id number]   Chief Complaint: Medical Management of Chronic Issues    HPI:  1. Hypothyroidism due to acquired atrophy of thyroid No problems that she is aware. Lab Results  Component Value Date   TSH 1.980 11/17/2019     2. Mild intermittent chronic asthma without complication She is on Wixela and singulair and denies any sob or wheezing.  3. Age-related osteoporosis without current pathological fracture Last dexascan was done on 02/26/17. She has severe osteoporosis. She is currently  Not on anything. She use to be on prolia  4. Frequent falls She has not had any falls in the last several months. She uses her walker to get around.    Outpatient Encounter Medications as of 05/18/2020  Medication Sig  . Calcium Carbonate-Vitamin D (CALCIUM 600+D) 600-400 MG-UNIT tablet Take 1 tablet by mouth daily.  . cetirizine (ZYRTEC) 10 MG tablet Take 1 tablet (10 mg total) by mouth at bedtime.  . hydrOXYzine (ATARAX/VISTARIL) 10 MG tablet TAKE 1 TABLET BY MOUTH 1 HOUR BEFORE BED  . levothyroxine (SYNTHROID) 25 MCG tablet Take 1 tablet (25 mcg total) by mouth daily before breakfast.  . lidocaine (LIDODERM) 5 % Place 1 patch onto the skin daily. Remove & Discard patch within 12 hours or as directed by MD  . montelukast (SINGULAIR) 10 MG tablet TAKE 1 TABLET BY MOUTH EVERYDAY AT BEDTIME  . Multiple Vitamin (MULTIVITAMIN WITH MINERALS) TABS tablet Take 1 tablet by mouth daily.  . Soft Lens Products (SALINE SENSITIVE EYES) SOLN Place 1-2 drops into both eyes 2 (two) times daily as needed.  . triamcinolone cream (KENALOG) 0.1 % Apply 1 application topically 2 (two) times daily as needed. Apply to arms, back, legs, and hands as needed; do not apply to face or groin  . WIXELA INHUB 100-50 MCG/DOSE AEPB TAKE 1 PUFF BY MOUTH TWICE A DAY     Past Surgical History:  Procedure Laterality Date  . CATARACT  EXTRACTION, BILATERAL    . EXCISION OF RIGHT CHEST MASS  12-19-1999   BENIGN  . MASTECTOMY  1990  (APPROX.)   RIGHT BREAST CANCER -- NO RECURRENCE  . ORIF BILATERAL WRIST  2009  . SHOULDER ARTHROSCOPY  12/06/2011   Procedure: ARTHROSCOPY SHOULDER;  Surgeon: Magnus Sinning, MD;  Location: [facility];  Service: Orthopedics;  Laterality: Right;  WITH LABRIAL DEBRIDEMENT AND SAD INTERSCALINE BLOCK    Family History  Problem Relation Age of Onset  . Stroke Mother   . Rheumatic fever Father   . Arthritis Sister        back and knees  . Cancer Brother        prostate  . Arthritis Sister        back and knees // rheumatoid  . Breast cancer Sister   . Cancer Sister        esophogeal  . Cancer Sister        lymphoma - non hodge.  . Dementia Sister   . Diabetes Sister   . Dementia Sister   . Arthritis Sister        hips  . Dementia Sister   . Dementia Sister     New complaints: None today  Social history: Her neices and family live with her.  Controlled substance contract: n/a    Review of Systems  Constitutional: Negative for diaphoresis.  Eyes: Negative  for pain.  Respiratory: Negative for shortness of breath.   Cardiovascular: Negative for chest pain, palpitations and leg swelling.  Gastrointestinal: Negative for abdominal pain.  Endocrine: Negative for polydipsia.  Skin: Negative for rash.  Neurological: Negative for dizziness, weakness and headaches.  Hematological: Does not bruise/bleed easily.  All other systems reviewed and are negative.      Objective:   Physical Exam Vitals and nursing note reviewed.  Constitutional:      General: She is not in acute distress.    Appearance: Normal appearance. She is well-developed.  HENT:     Head: Normocephalic.     Nose: Nose normal.  Eyes:     Pupils: Pupils are equal, round, and reactive to light.  Neck:     Vascular: No carotid bruit or JVD.  Cardiovascular:     Rate and Rhythm: Normal  rate and regular rhythm.     Heart sounds: Normal heart sounds.  Pulmonary:     Effort: Pulmonary effort is normal. No respiratory distress.     Breath sounds: Normal breath sounds. No wheezing or rales.  Chest:     Chest wall: No tenderness.  Abdominal:     General: Bowel sounds are normal. There is no distension or abdominal bruit.     Palpations: Abdomen is soft. There is no hepatomegaly, splenomegaly, mass or pulsatile mass.     Tenderness: There is no abdominal tenderness.  Musculoskeletal:        General: Normal range of motion.     Cervical back: Normal range of motion and neck supple.  Lymphadenopathy:     Cervical: No cervical adenopathy.  Skin:    General: Skin is warm and dry.  Neurological:     Mental Status: She is alert and oriented to person, place, and time.     Deep Tendon Reflexes: Reflexes are normal and symmetric.  Psychiatric:        Behavior: Behavior normal.        Thought Content: Thought content normal.        Judgment: Judgment normal.     BP 115/62   Pulse 83   Temp 98.1 F (36.7 C) (Temporal)   Resp 20   Ht _0  (1.549 m)   Wt 95 lb (43.1 kg)   BMI 17.95 kg/m       Assessment & Plan:.mmmpt   Sarah Boyd comes in today with chief complaint of Medical Management of Chronic Issues   Diagnosis and orders addressed:  1. Hypothyroidism due to acquired atrophy of thyroid Labs pending - CBC with Differential/Platelet - CMP14+EGFR - Lipid panel - Thyroid Panel With TSH - levothyroxine (SYNTHROID) 25 MCG tablet; Take 1 tablet (25 mcg total) by mouth daily before breakfast.  Dispense: 90 tablet; Refill: 1  2. Mild intermittent chronic asthma without complication - Fluticasone-Salmeterol (WIXELA INHUB) 100-50 MCG/DOSE AEPB; TAKE 1 PUFF BY MOUTH TWICE A DAY  Dispense: 180 each; Refill: 1 - montelukast (SINGULAIR) 10 MG tablet; TAKE 1 TABLET BY MOUTH EVERYDAY AT BEDTIME  Dispense: 90 tablet; Refill: 1 - cetirizine (ZYRTEC) 10 MG tablet;  Take 1 tablet (10 mg total) by mouth at bedtime.  Dispense: 90 tablet; Refill: 1  3. Age-related osteoporosis without current pathological fracture Fall prevention Repeat dexascan today  4. Frequent falls Use walker when walking   Labs pending Health Maintenance reviewed Diet and exercise encouraged  Follow up plan: 6 months   [place], FNP

## 2020-05-19 LAB — CBC WITH DIFFERENTIAL/PLATELET
Basophils Absolute: 0 10*3/uL (ref 0.0–0.2)
Basos: 1 %
EOS (ABSOLUTE): 0.1 10*3/uL (ref 0.0–0.4)
Eos: 1 %
Hematocrit: 40.1 % (ref 34.0–46.6)
Hemoglobin: 13.1 g/dL (ref 11.1–15.9)
Immature Grans (Abs): 0 10*3/uL (ref 0.0–0.1)
Immature Granulocytes: 0 %
Lymphocytes Absolute: 3 10*3/uL (ref 0.7–3.1)
Lymphs: 42 %
MCH: 27.5 pg (ref 26.6–33.0)
MCHC: 32.7 g/dL (ref 31.5–35.7)
MCV: 84 fL (ref 79–97)
Monocytes Absolute: 0.7 10*3/uL (ref 0.1–0.9)
Monocytes: 9 %
Neutrophils Absolute: 3.3 10*3/uL (ref 1.4–7.0)
Neutrophils: 47 %
Platelets: 239 10*3/uL (ref 150–450)
RBC: 4.77 x10E6/uL (ref 3.77–5.28)
RDW: 13.8 % (ref 11.7–15.4)
WBC: 7.1 10*3/uL (ref 3.4–10.8)

## 2020-05-19 LAB — CMP14+EGFR
ALT: 19 IU/L (ref 0–32)
AST: 22 IU/L (ref 0–40)
Albumin/Globulin Ratio: 1.5 (ref 1.2–2.2)
Albumin: 4.1 g/dL (ref 3.6–4.6)
Alkaline Phosphatase: 61 IU/L (ref 44–121)
BUN/Creatinine Ratio: 13 (ref 12–28)
BUN: 11 mg/dL (ref 8–27)
Bilirubin Total: 0.3 mg/dL (ref 0.0–1.2)
CO2: 27 mmol/L (ref 20–29)
Calcium: 9.7 mg/dL (ref 8.7–10.3)
Chloride: 103 mmol/L (ref 96–106)
Creatinine, Ser: 0.84 mg/dL (ref 0.57–1.00)
GFR calc Af Amer: 72 mL/min/{1.73_m2} (ref >59)
GFR calc non Af Amer: 62 mL/min/{1.73_m2} (ref >59)
Globulin, Total: 2.8 g/dL (ref 1.5–4.5)
Glucose: 74 mg/dL (ref 65–99)
Potassium: 5.3 mmol/L — ABNORMAL HIGH (ref 3.5–5.2)
Sodium: 142 mmol/L (ref 134–144)
Total Protein: 6.9 g/dL (ref 6.0–8.5)

## 2020-05-19 LAB — THYROID PANEL WITH TSH
Free Thyroxine Index: 1.8 (ref 1.2–4.9)
T3 Uptake Ratio: 27 % (ref 24–39)
T4, Total: 6.6 ug/dL (ref 4.5–12.0)
TSH: 2.36 u[IU]/mL (ref 0.450–4.500)

## 2020-05-19 LAB — LIPID PANEL
Chol/HDL Ratio: 1.8 ratio (ref 0.0–4.4)
Cholesterol, Total: 179 mg/dL (ref 100–199)
HDL: 100 mg/dL (ref >39)
LDL Chol Calc (NIH): 60 mg/dL (ref 0–99)
Triglycerides: 111 mg/dL (ref 0–149)
VLDL Cholesterol Cal: 19 mg/dL (ref 5–40)

## 2020-05-20 DIAGNOSIS — Z780 Asymptomatic menopausal state: Secondary | ICD-10-CM | POA: Diagnosis not present

## 2020-05-20 DIAGNOSIS — M810 Age-related osteoporosis without current pathological fracture: Secondary | ICD-10-CM | POA: Diagnosis not present

## 2020-05-24 ENCOUNTER — Other Ambulatory Visit: Payer: Self-pay | Admitting: Nurse Practitioner

## 2020-05-24 DIAGNOSIS — J4520 Mild intermittent asthma, uncomplicated: Secondary | ICD-10-CM

## 2020-06-03 ENCOUNTER — Ambulatory Visit (INDEPENDENT_AMBULATORY_CARE_PROVIDER_SITE_OTHER): Payer: Medicare HMO

## 2020-06-03 ENCOUNTER — Other Ambulatory Visit: Payer: Self-pay

## 2020-06-03 DIAGNOSIS — R1310 Dysphagia, unspecified: Secondary | ICD-10-CM | POA: Diagnosis not present

## 2020-06-03 DIAGNOSIS — M810 Age-related osteoporosis without current pathological fracture: Secondary | ICD-10-CM

## 2020-06-03 MED ORDER — DENOSUMAB 60 MG/ML ~~LOC~~ SOSY
60.0000 mg | PREFILLED_SYRINGE | Freq: Once | SUBCUTANEOUS | Status: AC
  Administered 2020-06-03: 60 mg via SUBCUTANEOUS

## 2020-06-03 NOTE — Progress Notes (Signed)
Prolia injection given to right deltoid.  Patient tolerated well.

## 2020-07-14 ENCOUNTER — Other Ambulatory Visit: Payer: Self-pay | Admitting: *Deleted

## 2020-07-14 ENCOUNTER — Other Ambulatory Visit: Payer: Self-pay | Admitting: Nurse Practitioner

## 2020-07-14 DIAGNOSIS — Z1231 Encounter for screening mammogram for malignant neoplasm of breast: Secondary | ICD-10-CM

## 2020-07-26 DIAGNOSIS — E859 Amyloidosis, unspecified: Secondary | ICD-10-CM | POA: Diagnosis not present

## 2020-08-04 DIAGNOSIS — R1310 Dysphagia, unspecified: Secondary | ICD-10-CM | POA: Diagnosis not present

## 2020-08-30 ENCOUNTER — Other Ambulatory Visit: Payer: Self-pay

## 2020-08-30 ENCOUNTER — Ambulatory Visit
Admission: RE | Admit: 2020-08-30 | Discharge: 2020-08-30 | Disposition: A | Discharge status: Home / Self Care | Payer: Medicare HMO | Source: Ambulatory Visit | Attending: Nurse Practitioner | Admitting: Nurse Practitioner

## 2020-08-30 DIAGNOSIS — Z1231 Encounter for screening mammogram for malignant neoplasm of breast: Secondary | ICD-10-CM | POA: Diagnosis not present

## 2020-09-01 DIAGNOSIS — R1310 Dysphagia, unspecified: Secondary | ICD-10-CM | POA: Diagnosis not present

## 2020-10-07 ENCOUNTER — Other Ambulatory Visit: Payer: Self-pay | Admitting: Nurse Practitioner

## 2020-10-07 DIAGNOSIS — J4520 Mild intermittent asthma, uncomplicated: Secondary | ICD-10-CM

## 2020-11-10 ENCOUNTER — Telehealth: Payer: Self-pay | Admitting: *Deleted

## 2020-11-10 NOTE — Telephone Encounter (Signed)
PA in process under cover my meds.   Key: [id number] - PA Case ID: [id number]

## 2020-11-11 NOTE — Telephone Encounter (Signed)
PA approved from 12/02/2020-06/11/2021. Prolia ordered.

## 2020-11-16 ENCOUNTER — Other Ambulatory Visit: Payer: Self-pay

## 2020-11-16 ENCOUNTER — Ambulatory Visit (INDEPENDENT_AMBULATORY_CARE_PROVIDER_SITE_OTHER): Payer: Medicare HMO | Admitting: Nurse Practitioner

## 2020-11-16 ENCOUNTER — Encounter: Payer: Self-pay | Admitting: Nurse Practitioner

## 2020-11-16 VITALS — BP 128/63 | HR 88 | Temp 98.4°F | Resp 20 | Ht 61.0 in | Wt 97.0 lb

## 2020-11-16 DIAGNOSIS — E034 Atrophy of thyroid (acquired): Secondary | ICD-10-CM

## 2020-11-16 DIAGNOSIS — M810 Age-related osteoporosis without current pathological fracture: Secondary | ICD-10-CM | POA: Diagnosis not present

## 2020-11-16 DIAGNOSIS — J4520 Mild intermittent asthma, uncomplicated: Secondary | ICD-10-CM | POA: Diagnosis not present

## 2020-11-16 DIAGNOSIS — R296 Repeated falls: Secondary | ICD-10-CM

## 2020-11-16 MED ORDER — CETIRIZINE HCL 10 MG PO TABS: 10.0000 mg | ORAL_TABLET | Freq: Every day | ORAL | 1 refills | Status: DC

## 2020-11-16 MED ORDER — LEVOTHYROXINE SODIUM 25 MCG PO TABS: 25.0000 ug | ORAL_TABLET | Freq: Every day | ORAL | 1 refills | Status: DC

## 2020-11-16 MED ORDER — HYDROXYZINE HCL 10 MG PO TABS: 10.0000 mg | ORAL_TABLET | Freq: Three times a day (TID) | ORAL | 5 refills | Status: DC | PRN

## 2020-11-16 MED ORDER — MONTELUKAST SODIUM 10 MG PO TABS: ORAL_TABLET | ORAL | 1 refills | Status: DC

## 2020-11-16 MED ORDER — FLUTICASONE-SALMETEROL 100-50 MCG/ACT IN AEPB: 1.0000 | INHALATION_SPRAY | Freq: Two times a day (BID) | RESPIRATORY_TRACT | 1 refills | Status: DC

## 2020-11-16 NOTE — Progress Notes (Signed)
Subjective:    Patient ID: [name], female    DOB: 02-Dec-1929, 87 y.o.   MRN: [id number]   Chief Complaint: medical management of chronic issues      HPI:  1. Hypothyroidism due to acquired atrophy of thyroid Having no problems that she is aware of. Lab Results  Component Value Date   TSH 2.360 05/18/2020     2. Mild intermittent chronic asthma without complication Says she is doing well. Denies cough or SOB. She is on wixala and singulair daily.  3. Age-related osteoporosis without current pathological fracture Last dexascan was done on 05/20/20 with t score of -3.2. sh eis suppose to be on prolia . Just recently was approved a d has not had shot yet.  4. Frequent falls Has not fallen in the last several months. Uses her walker most of the time.    Outpatient Encounter Medications as of 11/16/2020  Medication Sig  . Calcium Carbonate-Vitamin D (CALCIUM 600+D) 600-400 MG-UNIT tablet Take 1 tablet by mouth daily.  . cetirizine (ZYRTEC) 10 MG tablet Take 1 tablet (10 mg total) by mouth at bedtime.  . Fluticasone-Salmeterol (WIXELA INHUB) 100-50 MCG/DOSE AEPB TAKE 1 PUFF BY MOUTH TWICE A DAY  . hydrOXYzine (ATARAX/VISTARIL) 10 MG tablet TAKE 1 TABLET BY MOUTH 1 HOUR BEFORE BED  . levothyroxine (SYNTHROID) 25 MCG tablet Take 1 tablet (25 mcg total) by mouth daily before breakfast.  . lidocaine (LIDODERM) 5 % Place 1 patch onto the skin daily. Remove & Discard patch within 12 hours or as directed by MD  . montelukast (SINGULAIR) 10 MG tablet TAKE 1 TABLET BY MOUTH EVERYDAY AT BEDTIME  . Multiple Vitamin (MULTIVITAMIN WITH MINERALS) TABS tablet Take 1 tablet by mouth daily.  . Soft Lens Products (SALINE SENSITIVE EYES) SOLN Place 1-2 drops into both eyes 2 (two) times daily as needed.  . triamcinolone cream (KENALOG) 0.1 % Apply 1 application topically 2 (two) times daily as needed. Apply to arms, back, legs, and hands as needed; do not apply to face or groin   No  facility-administered encounter medications on file as of 11/16/2020.    Past Surgical History:  Procedure Laterality Date  . CATARACT EXTRACTION, BILATERAL    . EXCISION OF RIGHT CHEST MASS  12-19-1999   BENIGN  . MASTECTOMY  1990  (APPROX.)   RIGHT BREAST CANCER -- NO RECURRENCE  . ORIF BILATERAL WRIST  2009  . SHOULDER ARTHROSCOPY  12/06/2011   Procedure: ARTHROSCOPY SHOULDER;  Surgeon: Magnus Sinning, MD;  Location: [facility];  Service: Orthopedics;  Laterality: Right;  WITH LABRIAL DEBRIDEMENT AND SAD INTERSCALINE BLOCK    Family History  Problem Relation Age of Onset  . Stroke Mother   . Rheumatic fever Father   . Arthritis Sister        back and knees  . Cancer Brother        prostate  . Arthritis Sister        back and knees // rheumatoid  . Breast cancer Sister   . Cancer Sister        esophogeal  . Cancer Sister        lymphoma - non hodge.  . Dementia Sister   . Diabetes Sister   . Dementia Sister   . Arthritis Sister        hips  . Dementia Sister   . Dementia Sister     New complaints: None today  Social history: Her niece lives  with her and is her caregiver.  Controlled substance contract: n/a    Review of Systems  Constitutional: Negative for diaphoresis.  Eyes: Negative for pain.  Respiratory: Negative for shortness of breath.   Cardiovascular: Negative for chest pain, palpitations and leg swelling.  Gastrointestinal: Negative for abdominal pain.  Endocrine: Negative for polydipsia.  Skin: Negative for rash.  Neurological: Negative for dizziness, weakness and headaches.  Hematological: Does not bruise/bleed easily.  All other systems reviewed and are negative.      Objective:   Physical Exam Vitals and nursing note reviewed.  Constitutional:      General: She is not in acute distress.    Appearance: Normal appearance. She is well-developed.  HENT:     Head: Normocephalic.     Nose: Nose normal.  Eyes:      Pupils: Pupils are equal, round, and reactive to light.  Neck:     Vascular: No carotid bruit or JVD.  Cardiovascular:     Rate and Rhythm: Normal rate and regular rhythm.     Heart sounds: Normal heart sounds.  Pulmonary:     Effort: Pulmonary effort is normal. No respiratory distress.     Breath sounds: Normal breath sounds. No wheezing or rales.  Chest:     Chest wall: No tenderness.  Abdominal:     General: Bowel sounds are normal. There is no distension or abdominal bruit.     Palpations: Abdomen is soft. There is no hepatomegaly, splenomegaly, mass or pulsatile mass.     Tenderness: There is no abdominal tenderness.  Musculoskeletal:        General: Normal range of motion.     Cervical back: Normal range of motion and neck supple.  Lymphadenopathy:     Cervical: No cervical adenopathy.  Skin:    General: Skin is warm and dry.  Neurological:     Mental Status: She is alert and oriented to person, place, and time.     Deep Tendon Reflexes: Reflexes are normal and symmetric.  Psychiatric:        Behavior: Behavior normal.        Thought Content: Thought content normal.        Judgment: Judgment normal.    BP 128/63   Pulse 88   Temp 98.4 F (36.9 C) (Temporal)   Resp 20   Ht 5\' 1"  (1.549 m)   Wt 97 lb (44 kg)   SpO2 90%   BMI 18.33 kg/m        Assessment & Plan:  ALMYRA BIRMAN comes in today with chief complaint of Medical Management of Chronic Issues   Diagnosis and orders addressed:  1. Hypothyroidism due to acquired atrophy of thyroid Labs pending - levothyroxine (SYNTHROID) 25 MCG tablet; Take 1 tablet (25 mcg total) by mouth daily before breakfast.  Dispense: 90 tablet; Refill: 1  2. Mild intermittent chronic asthma without complication - hydrOXYzine (ATARAX/VISTARIL) 10 MG tablet; Take 1 tablet (10 mg total) by mouth 3 (three) times daily as needed.  Dispense: 90 tablet; Refill: 5 - cetirizine (ZYRTEC) 10 MG tablet; Take 1 tablet (10 mg total) by  mouth at bedtime.  Dispense: 90 tablet; Refill: 1 - montelukast (SINGULAIR) 10 MG tablet; TAKE 1 TABLET BY MOUTH EVERYDAY AT BEDTIME  Dispense: 90 tablet; Refill: 1 - fluticasone-salmeterol (WIXELA INHUB) 100-50 MCG/ACT AEPB; Inhale 1 puff into the lungs 2 (two) times daily.  Dispense: 3 each; Refill: 1  3. Age-related osteoporosis without current pathological fracture Fall  prevention  4. Frequent falls Fall prevention Will fid out about prolia and when she should expect to get injection   Labs pending Health Maintenance reviewed Diet and exercise encouraged  Follow up plan: 6 months   [place], FNP

## 2020-11-16 NOTE — Patient Instructions (Signed)
Fall Prevention in the Home, Adult Falls can cause injuries and can happen to people of all ages. There are many things you can do to make your home safe and to help prevent falls. Ask for help when making these changes. What actions can I take to prevent falls? General Instructions  Use good lighting in all rooms. Replace any light bulbs that burn out.  Turn on the lights in dark areas. Use night-lights.  Keep items that you use often in easy-to-reach places. Lower the shelves around your home if needed.  Set up your furniture so you have a clear path. Avoid moving your furniture around.  Do not have throw rugs or other things on the floor that can make you trip.  Avoid walking on wet floors.  If any of your floors are uneven, fix them.  Add color or contrast paint or tape to clearly mark and help you see: ? Grab bars or handrails. ? First and last steps of staircases. ? Where the edge of each step is.  If you use a stepladder: ? Make sure that it is fully opened. Do not climb a closed stepladder. ? Make sure the sides of the stepladder are locked in place. ? Ask someone to hold the stepladder while you use it.  Know where your pets are when moving through your home. What can I do in the bathroom?  Keep the floor dry. Clean up any water on the floor right away.  Remove soap buildup in the tub or shower.  Use nonskid mats or decals on the floor of the tub or shower.  Attach bath mats securely with double-sided, nonslip rug tape.  If you need to sit down in the shower, use a plastic, nonslip stool.  Install grab bars by the toilet and in the tub and shower. Do not use towel bars as grab bars.      What can I do in the bedroom?  Make sure that you have a light by your bed that is easy to reach.  Do not use any sheets or blankets for your bed that hang to the floor.  Have a firm chair with side arms that you can use for support when you get dressed. What can I do in  the kitchen?  Clean up any spills right away.  If you need to reach something above you, use a step stool with a grab bar.  Keep electrical cords out of the way.  Do not use floor polish or wax that makes floors slippery. What can I do with my stairs?  Do not leave any items on the stairs.  Make sure that you have a light switch at the top and the bottom of the stairs.  Make sure that there are handrails on both sides of the stairs. Fix handrails that are broken or loose.  Install nonslip stair treads on all your stairs.  Avoid having throw rugs at the top or bottom of the stairs.  Choose a carpet that does not hide the edge of the steps on the stairs.  Check carpeting to make sure that it is firmly attached to the stairs. Fix carpet that is loose or worn. What can I do on the outside of my home?  Use bright outdoor lighting.  Fix the edges of walkways and driveways and fix any cracks.  Remove anything that might make you trip as you walk through a door, such as a raised step or threshold.  Trim any   bushes or trees on paths to your home.  Check to see if handrails are loose or broken and that both sides of all steps have handrails.  Install guardrails along the edges of any raised decks and porches.  Clear paths of anything that can make you trip, such as tools or rocks.  Have leaves, snow, or ice cleared regularly.  Use sand or salt on paths during winter.  Clean up any spills in your garage right away. This includes grease or oil spills. What other actions can I take?  Wear shoes that: ? Have a low heel. Do not wear high heels. ? Have rubber bottoms. ? Feel good on your feet and fit well. ? Are closed at the toe. Do not wear open-toe sandals.  Use tools that help you move around if needed. These include: ? Canes. ? Walkers. ? Scooters. ? Crutches.  Review your medicines with your doctor. Some medicines can make you feel dizzy. This can increase your chance  of falling. Ask your doctor what else you can do to help prevent falls. Where to find more information  Centers for Disease Control and Prevention, [employer]: www.cdc.gov  National Institute on Aging: www.nia.nih.gov Contact a doctor if:  You are afraid of falling at home.  You feel weak, drowsy, or dizzy at home.  You fall at home. Summary  There are many simple things that you can do to make your home safe and to help prevent falls.  Ways to make your home safe include removing things that can make you trip and installing grab bars in the bathroom.  Ask for help when making these changes in your home. This information is not intended to replace advice given to you by your health care provider. Make sure you discuss any questions you have with your health care provider. Document Revised: 12/31/2019 Document Reviewed: 12/31/2019 Elsevier Patient Education  2021 [employer]  

## 2020-11-17 LAB — CBC WITH DIFFERENTIAL/PLATELET
Basophils Absolute: 0 10*3/uL (ref 0.0–0.2)
Basos: 0 %
EOS (ABSOLUTE): 0.1 10*3/uL (ref 0.0–0.4)
Eos: 1 %
Hematocrit: 40.6 % (ref 34.0–46.6)
Hemoglobin: 13.4 g/dL (ref 11.1–15.9)
Immature Grans (Abs): 0 10*3/uL (ref 0.0–0.1)
Immature Granulocytes: 0 %
Lymphocytes Absolute: 3.2 10*3/uL — ABNORMAL HIGH (ref 0.7–3.1)
Lymphs: 41 %
MCH: 27.5 pg (ref 26.6–33.0)
MCHC: 33 g/dL (ref 31.5–35.7)
MCV: 83 fL (ref 79–97)
Monocytes Absolute: 0.6 10*3/uL (ref 0.1–0.9)
Monocytes: 8 %
Neutrophils Absolute: 3.8 10*3/uL (ref 1.4–7.0)
Neutrophils: 50 %
Platelets: 286 10*3/uL (ref 150–450)
RBC: 4.87 x10E6/uL (ref 3.77–5.28)
RDW: 13.8 % (ref 11.7–15.4)
WBC: 7.7 10*3/uL (ref 3.4–10.8)

## 2020-11-17 LAB — THYROID PANEL WITH TSH
Free Thyroxine Index: 1.5 (ref 1.2–4.9)
T3 Uptake Ratio: 24 % (ref 24–39)
T4, Total: 6.4 ug/dL (ref 4.5–12.0)
TSH: 2.96 u[IU]/mL (ref 0.450–4.500)

## 2020-11-17 LAB — CMP14+EGFR
ALT: 19 IU/L (ref 0–32)
AST: 24 IU/L (ref 0–40)
Albumin/Globulin Ratio: 1.7 (ref 1.2–2.2)
Albumin: 4.5 g/dL (ref 3.6–4.6)
Alkaline Phosphatase: 74 IU/L (ref 44–121)
BUN/Creatinine Ratio: 19 (ref 12–28)
BUN: 15 mg/dL (ref 8–27)
Bilirubin Total: 0.3 mg/dL (ref 0.0–1.2)
CO2: 25 mmol/L (ref 20–29)
Calcium: 9.3 mg/dL (ref 8.7–10.3)
Chloride: 103 mmol/L (ref 96–106)
Creatinine, Ser: 0.81 mg/dL (ref 0.57–1.00)
Globulin, Total: 2.6 g/dL (ref 1.5–4.5)
Glucose: 84 mg/dL (ref 65–99)
Potassium: 4.5 mmol/L (ref 3.5–5.2)
Sodium: 142 mmol/L (ref 134–144)
Total Protein: 7.1 g/dL (ref 6.0–8.5)
eGFR: 70 mL/min/{1.73_m2} (ref >59)

## 2020-11-17 NOTE — Telephone Encounter (Signed)
Message left for patient to call back and schedule prolia. Patient is due after 12/03/20. Prolia is in refrigerator.

## 2020-12-12 ENCOUNTER — Other Ambulatory Visit: Payer: Self-pay | Admitting: Nurse Practitioner

## 2020-12-12 DIAGNOSIS — J4520 Mild intermittent asthma, uncomplicated: Secondary | ICD-10-CM

## 2020-12-16 DIAGNOSIS — C50911 Malignant neoplasm of unspecified site of right female breast: Secondary | ICD-10-CM | POA: Diagnosis not present

## 2020-12-28 NOTE — Telephone Encounter (Signed)
Appointment scheduled for 12/29/2020 for prolia

## 2020-12-29 ENCOUNTER — Ambulatory Visit (INDEPENDENT_AMBULATORY_CARE_PROVIDER_SITE_OTHER): Payer: Medicare HMO | Admitting: *Deleted

## 2020-12-29 ENCOUNTER — Other Ambulatory Visit: Payer: Self-pay

## 2020-12-29 DIAGNOSIS — M810 Age-related osteoporosis without current pathological fracture: Secondary | ICD-10-CM

## 2020-12-29 MED ORDER — DENOSUMAB 60 MG/ML ~~LOC~~ SOSY
60.0000 mg | PREFILLED_SYRINGE | Freq: Once | SUBCUTANEOUS | Status: AC
  Administered 2020-12-29: 60 mg via SUBCUTANEOUS

## 2020-12-30 ENCOUNTER — Ambulatory Visit: Payer: Medicare HMO

## 2021-01-09 ENCOUNTER — Other Ambulatory Visit: Payer: Self-pay | Admitting: Nurse Practitioner

## 2021-01-09 DIAGNOSIS — J4520 Mild intermittent asthma, uncomplicated: Secondary | ICD-10-CM

## 2021-01-17 DIAGNOSIS — L299 Pruritus, unspecified: Secondary | ICD-10-CM | POA: Diagnosis not present

## 2021-01-17 DIAGNOSIS — E859 Amyloidosis, unspecified: Secondary | ICD-10-CM | POA: Diagnosis not present

## 2021-04-07 ENCOUNTER — Ambulatory Visit (INDEPENDENT_AMBULATORY_CARE_PROVIDER_SITE_OTHER): Payer: Medicare HMO

## 2021-04-07 DIAGNOSIS — Z0000 Encounter for general adult medical examination without abnormal findings: Secondary | ICD-10-CM

## 2021-04-07 NOTE — Progress Notes (Signed)
MEDICARE ANNUAL WELLNESS VISIT  04/07/2021  Telephone Visit Disclaimer This Medicare AWV was conducted by telephone due to national recommendations for restrictions regarding the COVID-19 Pandemic (e.g. social distancing).  I verified, using two identifiers, that I am speaking with Sarah Boyd or their authorized healthcare agent. I discussed the limitations, risks, security, and privacy concerns of performing an evaluation and management service by telephone and the potential availability of an in-person appointment in the future. The patient expressed understanding and agreed to proceed.  Location of Patient: Home Location of Provider (nurse):  [facility]  Subjective:    Sarah Boyd is a 87 y.o. female patient of Chevis Pretty, [place] who had a Medicare Annual Wellness Visit today via telephone. Sarah Boyd is Retired and lives with her niece.  She has no children. She reports that she is socially active and does interact with friends/family regularly. She is minimally physically active and enjoys reading her bible and watching television.  Patient Care Team: Chevis Pretty, FNP as PCP - General (Family Medicine) Sandford Craze, MD as Referring Physician (Dermatology) Star Age, MD as Attending Physician (Neurology) Shon Hough, MD as Consulting Physician (Ophthalmology)  Advanced Directives 04/07/2021 11/01/2019 02/26/2018 12/27/2017 01/03/2017 12/06/2016 04/11/2016  Does Patient Have a Medical Advance Directive? No No No No No No Yes  Does patient want to make changes to medical advance directive? - - - - - - Yes - information given  Copy of [place] in Chart? - - - - - - -  Would patient like information on creating a medical advance directive? No - Patient declined - No - Patient declined Yes (MAU/Ambulatory/Procedural Areas - Information given) - Yes (MAU/Ambulatory/Procedural Areas - Information given) -  Pre-existing out of facility DNR  order (yellow form or pink MOST form) - - - - - - -    Hospital Utilization Over the Past 12 Months: # of hospitalizations or ER visits: 0 # of surgeries: 0  Review of Systems    Patient reports that her overall health is unchanged compared to last year.  History obtained from chart review and the patient  Patient Reported Readings (BP, Pulse, CBG, Weight, etc) none  Pain Assessment Pain : No/denies pain     Current Medications & Allergies (verified) Allergies as of 04/07/2021       Reactions   Chocolate Shortness Of Breath   Benadryl [diphenhydramine Hcl] Other (See Comments)   Reaction:  Altered mental status         Medication List        Accurate as of April 07, 2021  1:25 PM. If you have any questions, ask your nurse or doctor.          Calcium Carbonate-Vitamin D 600-400 MG-UNIT tablet Take 1 tablet by mouth daily.   cetirizine 10 MG tablet Commonly known as: ZYRTEC Take 1 tablet (10 mg total) by mouth at bedtime.   fluticasone-salmeterol 100-50 MCG/ACT Aepb Commonly known as: Wixela Inhub Inhale 1 puff into the lungs 2 (two) times daily.   hydrOXYzine 10 MG tablet Commonly known as: ATARAX/VISTARIL TAKE 1 TABLET BY MOUTH THREE TIMES A DAY AS NEEDED   levothyroxine 25 MCG tablet Commonly known as: SYNTHROID Take 1 tablet (25 mcg total) by mouth daily before breakfast.   lidocaine 5 % Commonly known as: Lidoderm Place 1 patch onto the skin daily. Remove & Discard patch within 12 hours or as directed by MD   montelukast 10 MG tablet Commonly  known as: SINGULAIR TAKE 1 TABLET BY MOUTH EVERYDAY AT BEDTIME   multivitamin with minerals Tabs tablet Take 1 tablet by mouth daily.   Saline Sensitive Eyes Soln Place 1-2 drops into both eyes 2 (two) times daily as needed.   triamcinolone cream 0.1 % Commonly known as: KENALOG Apply 1 application topically 2 (two) times daily as needed. Apply to arms, back, legs, and hands as needed; do not apply  to face or groin        History (reviewed): Past Medical History:  Diagnosis Date   Allergy    Asthma    Cancer ([place])    Breast CA, Rt Mastectomy   History of breast cancer S/P RIGHT MASTECTOMY AND CHEMO 21 YRS AGO (APPROX.  1990)   NO RECURRENCE   HOH (hard of hearing) NO AIDS   Hypothyroidism    Labral tear of shoulder RIGHT SHOULDER   Osteoporosis    Right shoulder pain    Past Surgical History:  Procedure Laterality Date   CATARACT EXTRACTION, BILATERAL     EXCISION OF RIGHT CHEST MASS  12-19-1999   BENIGN   MASTECTOMY  1990  (APPROX.)   RIGHT BREAST CANCER -- NO RECURRENCE   ORIF BILATERAL WRIST  2009   SHOULDER ARTHROSCOPY  12/06/2011   Procedure: ARTHROSCOPY SHOULDER;  Surgeon: Magnus Sinning, MD;  Location: [facility];  Service: Orthopedics;  Laterality: Right;  WITH LABRIAL DEBRIDEMENT AND SAD INTERSCALINE BLOCK   Family History  Problem Relation Age of Onset   Stroke Mother    Rheumatic fever Father    Arthritis Sister        back and knees   Cancer Brother        prostate   Arthritis Sister        back and knees // rheumatoid   Breast cancer Sister    Cancer Sister        esophogeal   Cancer Sister        lymphoma - non hodge.   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   Never married and no children    Social Determinants of Radio broadcast assistant Strain: Not on file  Food Insecurity: Not on file  Transportation Needs: Not on file  Physical Activity: Not on file  Stress: Not on file  Social Connections: Not on file    Activities of Daily Living In your present state of health, do you have any difficulty performing the following activities: 04/07/2021 05/18/2020  Hearing? Y N  Vision? Y N  Difficulty concentrating or  making decisions? N N  Walking or climbing stairs? Y Y  Dressing or bathing? N N  Doing errands, shopping? Y N  Preparing Food and eating ? Y -  Using the Toilet? N -  In the past six months, have you accidently leaked urine? N -  Do you have problems with loss of bowel control? N -  Managing your Medications? N -  Managing your Finances? Y -  Housekeeping or managing your Housekeeping? Y -  Some recent data might be hidden   Patient reports that her hearing and her vision is not as good as it was.  She goes for yearly eye exams but it has been several years since a hearing evaluation.  She does have difficulty walking because she feels her legs are not as strong.  She always ambulates with a walker.  She is unable to do errands on her own and her niece normally does these for her.  She also prepares meals, manages her finances, and keeps the house clean.  Patient Education/ Literacy How often do you need to have someone help you when you read instructions, pamphlets, or other written materials from your doctor or pharmacy?: 1 - Never What is the last grade level you completed in school?: some college  Exercise Current Exercise Habits: The patient does not participate in regular exercise at present, Exercise limited by: orthopedic condition(s)  Diet Patient reports consuming 3 meals a day and 1 snack(s) a day Patient reports that her primary diet is: Regular Patient reports that she does have  regular access to food.   Depression Screen PHQ 2/9 Scores 04/07/2021 11/16/2020 05/18/2020 06/16/2019 12/12/2018 06/13/2018 03/08/2018  PHQ - 2 Score 0 0 0 0 0 0 0  PHQ- 9 Score - 1 - - - - -     Fall Risk Fall Risk  04/07/2021 11/16/2020 05/18/2020 11/17/2019 06/16/2019  Falls in the past year? 0 1 0 1 1  Number falls in past yr: - 0 - 1 1  Injury with Fall? - 0 - 0 0  Comment - - - - -  Risk Factor Category  - - - - -  Risk for fall due to : - History of fall(s) - - -  Risk for fall due to: Comment - - - - -  Follow up Falls evaluation completed Education provided - - -     Objective:  Sarah Boyd seemed alert and oriented and she participated appropriately during our telephone visit.  Blood Pressure Weight BMI  BP Readings from Last 3 Encounters:  11/16/20 128/63  05/18/20 115/62  11/17/19 129/71   Wt Readings from Last 3 Encounters:  11/16/20 97 lb (44 kg)  05/18/20 95 lb (43.1 kg)  11/17/19 91 lb (41.3 kg)   BMI Readings from Last 1 Encounters:  11/16/20 18.33 kg/m    *Unable to obtain current vital signs, weight, and BMI due to telephone visit type  Hearing/Vision  Sarah Boyd did not seem to have difficulty with hearing/understanding during the telephone conversation Reports that she has had a formal eye exam by an eye care professional within the past year Reports that she has not had a formal hearing evaluation within the past year *Unable to fully assess hearing and vision during telephone visit type  Cognitive Function: 6CIT Screen 04/07/2021  What Year? 4 points  What month? 3 points  What time? 0 points  Count back from 20 0 points  Months in reverse 0 points  Repeat  phrase 4 points  Total Score 11   (Normal:0-7, Significant for Dysfunction: >8)  Normal Cognitive Function Screening: Yes   Immunization & Health Maintenance Record Immunization History  Administered Date(s) Administered   Fluad Quad(high Dose 65+) 05/13/2019, 05/18/2020   Influenza, High Dose  Seasonal PF 05/08/2016, 05/08/2016, 03/08/2018, 03/08/2018   Influenza,inj,Quad PF,6+ Mos 04/28/2013, 04/09/2014, 04/22/2015   Moderna Sars-Covid-2 Vaccination 04/12/2020   PFIZER(Purple Top)SARS-COV-2 Vaccination 08/07/2019, 08/27/2019   Pneumococcal Conjugate-13 04/22/2015   Pneumococcal Polysaccharide-23 01/22/2014   Tdap 12/06/2016, 11/01/2019    Health Maintenance  Topic Date Due   Zoster Vaccines- Shingrix (1 of 2) Never done   COVID-19 Vaccine (4 - Booster for Pfizer series) 06/07/2020   INFLUENZA VACCINE  01/10/2021   MAMMOGRAM  08/30/2021   TETANUS/TDAP  10/31/2029   Pneumonia Vaccine 67+ Years old  Completed   DEXA SCAN  Completed   HPV VACCINES  Aged Out       Assessment  This is a routine wellness examination for Sarah Boyd.  Health Maintenance: Due or Overdue Health Maintenance Due  Topic Date Due   Zoster Vaccines- Shingrix (1 of 2) Never done   COVID-19 Vaccine (4 - Booster for Pfizer series) 06/07/2020   INFLUENZA VACCINE  01/10/2021    Sarah Boyd does not need a referral for Community Assistance: Care Management:   no Social Work:    no Prescription Assistance:  no Nutrition/Diabetes Education:  no   Plan:  Personalized Goals  Goals Addressed             This Visit's Progress    Patient Stated       04/07/2021 AWV Goal: Fall Prevention  Over the next year, patient will decrease their risk for falls by: Using assistive devices, such as a cane or walker, as needed Identifying fall risks within their home and correcting them by: Removing throw rugs Adding handrails to stairs or ramps Removing clutter and keeping a clear pathway throughout the home Increasing light, especially at night Adding shower handles/bars Raising toilet seat Identifying potential personal risk factors for falls: Medication side effects Incontinence/urgency Vestibular dysfunction Hearing loss Musculoskeletal disorders Neurological disorders Orthostatic  hypotension         Personalized Health Maintenance & Screening Recommendations  Influenza vaccine  Lung Cancer Screening Recommended: no (Low Dose CT Chest recommended if Age 76-80 years, 30 pack-year currently smoking OR have quit w/in past 15 years) Hepatitis C Screening recommended: no HIV Screening recommended: no  Advanced Directives: Written information was not prepared per patient's request.  Referrals & Orders No orders of the defined types were placed in this encounter.   Follow-up Plan Follow-up with Chevis Pretty, FNP as planned    I have personally reviewed and noted the following in the patient's chart:   Medical and social history Use of alcohol, tobacco or illicit drugs  Current medications and supplements Functional ability and status Nutritional status Physical activity Advanced directives List of other physicians Hospitalizations, surgeries, and ER visits in previous 12 months Vitals Screenings to include cognitive, depression, and falls Referrals and appointments  In addition, I have reviewed and discussed with Sarah Boyd certain preventive protocols, quality metrics, and best practice recommendations. A written personalized care plan for preventive services as well as general preventive health recommendations is available and can be mailed to the patient at her request.      Burnadette Pop  04/07/2021

## 2021-04-08 ENCOUNTER — Ambulatory Visit (INDEPENDENT_AMBULATORY_CARE_PROVIDER_SITE_OTHER): Payer: Medicare HMO

## 2021-04-08 ENCOUNTER — Other Ambulatory Visit: Payer: Self-pay

## 2021-04-08 DIAGNOSIS — Z23 Encounter for immunization: Secondary | ICD-10-CM | POA: Diagnosis not present

## 2021-05-11 DIAGNOSIS — Z961 Presence of intraocular lens: Secondary | ICD-10-CM | POA: Diagnosis not present

## 2021-05-11 DIAGNOSIS — H52203 Unspecified astigmatism, bilateral: Secondary | ICD-10-CM | POA: Diagnosis not present

## 2021-05-11 DIAGNOSIS — H04123 Dry eye syndrome of bilateral lacrimal glands: Secondary | ICD-10-CM | POA: Diagnosis not present

## 2021-05-17 ENCOUNTER — Other Ambulatory Visit: Payer: Self-pay | Admitting: Nurse Practitioner

## 2021-05-17 DIAGNOSIS — J4520 Mild intermittent asthma, uncomplicated: Secondary | ICD-10-CM

## 2021-05-19 ENCOUNTER — Ambulatory Visit: Payer: Self-pay | Admitting: Nurse Practitioner

## 2021-05-23 ENCOUNTER — Other Ambulatory Visit: Payer: Self-pay | Admitting: Nurse Practitioner

## 2021-05-23 DIAGNOSIS — E034 Atrophy of thyroid (acquired): Secondary | ICD-10-CM

## 2021-05-30 ENCOUNTER — Encounter: Payer: Self-pay | Admitting: Nurse Practitioner

## 2021-05-30 ENCOUNTER — Ambulatory Visit (INDEPENDENT_AMBULATORY_CARE_PROVIDER_SITE_OTHER): Payer: Medicare HMO | Admitting: Nurse Practitioner

## 2021-05-30 VITALS — BP 129/67 | HR 84 | Temp 98.0°F | Resp 20 | Ht 61.0 in | Wt 90.0 lb

## 2021-05-30 DIAGNOSIS — E034 Atrophy of thyroid (acquired): Secondary | ICD-10-CM | POA: Diagnosis not present

## 2021-05-30 DIAGNOSIS — M810 Age-related osteoporosis without current pathological fracture: Secondary | ICD-10-CM | POA: Diagnosis not present

## 2021-05-30 DIAGNOSIS — R634 Abnormal weight loss: Secondary | ICD-10-CM | POA: Diagnosis not present

## 2021-05-30 MED ORDER — LEVOTHYROXINE SODIUM 25 MCG PO TABS: 25.0000 ug | ORAL_TABLET | Freq: Every day | ORAL | 1 refills | Status: DC

## 2021-05-30 NOTE — Patient Instructions (Signed)
High-Protein and High-Calorie Diet Eating high-protein and high-calorie foods can help you to gain weight, heal after an injury, and recover after an illness or surgery. The specific amount of daily protein and calories you need depends on: Your body weight. The reason this diet is recommended for you. Generally, a high-protein, high-calorie diet involves: Eating 250-500 extra calories each day. Making sure that you get enough of your daily calories from protein. Ask your health care provider how many of your calories should come from protein. Talk with a health care provider or a dietitian about how much protein and how many calories you need each day. Follow the diet as directed by your health care provider. What are tips for following this plan? Reading food labels Check the nutrition facts label for calories, grams of fat and protein. Items with more than 4 grams of protein are high-protein foods. Preparing meals Add whole milk, half-and-half, or heavy cream to cereal, pudding, soup, or hot cocoa. Add whole milk to instant breakfast drinks. Add peanut butter to oatmeal or smoothies. Add powdered milk to baked goods, smoothies, or milkshakes. Add powdered milk, cream, or butter to mashed potatoes. Add cheese to cooked vegetables. Make whole-milk yogurt parfaits. Top them with granola, fruit, or nuts. Add cottage cheese to fruit. Add avocado, cheese, or both to sandwiches or salads. Add avocado to smoothies. Add meat, poultry, or seafood to rice, pasta, casseroles, salads, and soups. Use mayonnaise when making egg salad, chicken salad, or tuna salad. Use peanut butter as a dip for fruits and vegetables or as a topping for pretzels, celery, or crackers. Add beans to casseroles, dips, and spreads. Add pureed beans to sauces and soups. Replace calorie-free drinks with calorie-containing drinks, such as milk and fruit juice. Replace water with milk or heavy cream when making foods such as  oatmeal, pudding, or cocoa. Add oil or butter to cooked vegetables and grains. Add cream cheese to sandwiches or as a topping on crackers and bread. Make cream-based pastas and soups. General information Ask your health care provider if you should take a nutritional supplement. Try to eat six small meals each day instead of three large meals. A general goal is to eat every 2 to 3 hours. Eat a balanced diet. In each meal, include one food that is high in protein and one food with fat in it. Keep nutritious snacks available, such as nuts, trail mixes, dried fruit, and yogurt. If you have kidney disease or diabetes, talk with your health care provider about how much protein is safe for you. Too much protein may put extra stress on your kidneys. Drink your calories. Choose high-calorie drinks and have them after your meals. Consider setting a timer to remind you to eat. You will want to eat even if you do not feel very hungry. What high-protein foods should I eat? Vegetables Soybeans. Peas. Grains Quinoa. Bulgur wheat. Buckwheat. Meats and other proteins Beef, pork, and poultry. Fish and seafood. Eggs. Tofu. Textured vegetable protein (TVP). Peanut butter. Nuts and seeds. Dried beans. Protein powders. Hummus. Dairy Whole milk. Whole-milk yogurt. Powdered milk. Cheese. Cottage Cheese. Eggnog. Beverages High-protein supplement drinks. Soy milk. Other foods Protein bars. The items listed above may not be a complete list of foods and beverages you can eat and drink. Contact a dietitian for more information. What high-calorie foods should I eat? Fruits Dried fruit. Fruit leather. Canned fruit in syrup. Fruit juice. Avocado. Vegetables Vegetables cooked in oil or butter. Fried potatoes. Grains Pasta. Quick   breads. Muffins. Pancakes. Ready-to-eat cereal. Meats and other proteins Peanut butter. Nuts and seeds. Dairy Heavy cream. Whipped cream. Cream cheese. Sour cream. Ice cream. Custard.  Pudding. Whole milk dairy products. Beverages Meal-replacement beverages. Nutrition shakes. Fruit juice. Seasonings and condiments Salad dressing. Mayonnaise. Alfredo sauce. Fruit preserves or jelly. Honey. Syrup. Sweets and desserts Cake. Cookies. Pie. Pastries. Candy bars. Chocolate. Fats and oils Butter or margarine. Oil. Gravy. Other foods Meal-replacement bars. The items listed above may not be a complete list of foods and beverages you can eat and drink. Contact a dietitian for more information. Summary A high-protein, high-calorie diet can help you gain weight or heal faster after an injury, illness, or surgery. To increase your protein and calories, add ingredients such as whole milk, peanut butter, cheese, beans, meat, or seafood to meal items. To get enough extra calories each day, include high-calorie foods and beverages at each meal. Adding a high-calorie drink or shake can be an easy way to help you get enough calories each day. Talk with your healthcare provider or dietitian about the best options for you. This information is not intended to replace advice given to you by your health care provider. Make sure you discuss any questions you have with your health care provider. Document Revised: 05/02/2020 Document Reviewed: 05/02/2020 Elsevier Patient Education  2022 Elsevier Inc.  

## 2021-05-30 NOTE — Progress Notes (Signed)
Subjective:    Patient ID: Sarah Boyd, female    DOB: [birth year], 87 y.o.   MRN: [id number]   Chief Complaint: Medical Management of Chronic Issues  * her niece that lives with her is in then car with covid. Patient not able to answers in questions very appropriately  HPI:  1. Hypothyroidism due to acquired atrophy of thyroid No problems with she is aware of.  2. Age-related osteoporosis without current pathological fracture Last dexascan was done 05/18/20 with t score of -3.2. she fell last week but did not hurt herself.  Wt Readings from Last 3 Encounters:  05/30/21 90 lb (40.8 kg)  11/16/20 97 lb (44 kg)  05/18/20 95 lb (43.1 kg)     Outpatient Encounter Medications as of 05/30/2021  Medication Sig   Calcium Carbonate-Vitamin D 600-400 MG-UNIT tablet Take 1 tablet by mouth daily.   cetirizine (ZYRTEC) 10 MG tablet Take 1 tablet (10 mg total) by mouth at bedtime.   hydrOXYzine (ATARAX/VISTARIL) 10 MG tablet TAKE 1 TABLET BY MOUTH THREE TIMES A DAY AS NEEDED   levothyroxine (SYNTHROID) 25 MCG tablet TAKE 1 TABLET BY MOUTH DAILY BEFORE BREAKFAST.   lidocaine (LIDODERM) 5 % Place 1 patch onto the skin daily. Remove & Discard patch within 12 hours or as directed by MD   montelukast (SINGULAIR) 10 MG tablet TAKE 1 TABLET BY MOUTH EVERYDAY AT BEDTIME   Multiple Vitamin (MULTIVITAMIN WITH MINERALS) TABS tablet Take 1 tablet by mouth daily.   Soft Lens Products (SALINE SENSITIVE EYES) SOLN Place 1-2 drops into both eyes 2 (two) times daily as needed.   triamcinolone cream (KENALOG) 0.1 % Apply 1 application topically 2 (two) times daily as needed. Apply to arms, back, legs, and hands as needed; do not apply to face or groin   WIXELA INHUB 100-50 MCG/ACT AEPB INHALE 1 PUFF INTO THE LUNGS TWICE A DAY   No facility-administered encounter medications on file as of 05/30/2021.    Past Surgical History:  Procedure Laterality Date   CATARACT EXTRACTION, BILATERAL     EXCISION OF  RIGHT CHEST MASS  12-19-1999   BENIGN   MASTECTOMY  1990  (APPROX.)   RIGHT BREAST CANCER -- NO RECURRENCE   ORIF BILATERAL WRIST  2009   SHOULDER ARTHROSCOPY  12/06/2011   Procedure: ARTHROSCOPY SHOULDER;  Surgeon: Magnus Sinning, MD;  Location: [facility];  Service: Orthopedics;  Laterality: Right;  WITH LABRIAL DEBRIDEMENT AND SAD INTERSCALINE BLOCK    Family History  Problem Relation Age of Onset   Stroke Mother    Rheumatic fever Father    Arthritis Sister        back and knees   Cancer Brother        prostate   Arthritis Sister        back and knees // rheumatoid   Breast cancer Sister    Cancer Sister        esophogeal   Cancer Sister        lymphoma - non hodge.   Dementia Sister    Diabetes Sister    Dementia Sister    Arthritis Sister        hips   Dementia Sister    Dementia Sister     New complaints: None today  Social history: Lives with he neice  Controlled substance contract: n/a     Review of Systems  Constitutional:  Negative for diaphoresis.  Eyes:  Negative for pain.  Respiratory:  Negative for shortness of breath.   Cardiovascular:  Negative for chest pain, palpitations and leg swelling.  Gastrointestinal:  Negative for abdominal pain.  Endocrine: Negative for polydipsia.  Skin:  Negative for rash.  Neurological:  Negative for dizziness, weakness and headaches.  Hematological:  Does not bruise/bleed easily.  All other systems reviewed and are negative.     Objective:   Physical Exam Vitals and nursing note reviewed.  Constitutional:      General: She is not in acute distress.    Appearance: Normal appearance. She is well-developed.  HENT:     Head: Normocephalic.     Right Ear: Tympanic membrane normal.     Left Ear: Tympanic membrane normal.     Nose: Nose normal.     Mouth/Throat:     Mouth: Mucous membranes are moist.  Eyes:     Pupils: Pupils are equal, round, and reactive to light.  Neck:      Vascular: No carotid bruit or JVD.  Cardiovascular:     Rate and Rhythm: Normal rate and regular rhythm.     Heart sounds: Normal heart sounds.  Pulmonary:     Effort: Pulmonary effort is normal. No respiratory distress.     Breath sounds: Normal breath sounds. No wheezing or rales.  Chest:     Chest wall: No tenderness.  Abdominal:     General: Bowel sounds are normal. There is no distension or abdominal bruit.     Palpations: Abdomen is soft. There is no hepatomegaly, splenomegaly, mass or pulsatile mass.     Tenderness: There is no abdominal tenderness.  Musculoskeletal:        General: Normal range of motion.     Cervical back: Normal range of motion and neck supple.  Lymphadenopathy:     Cervical: No cervical adenopathy.  Skin:    General: Skin is warm and dry.  Neurological:     Mental Status: She is alert and oriented to person, place, and time.     Deep Tendon Reflexes: Reflexes are normal and symmetric.  Psychiatric:        Behavior: Behavior normal.        Thought Content: Thought content normal.        Judgment: Judgment normal.     Comments: Answers all questions appropriately.   BP 129/67    Pulse 84    Temp 98 F (36.7 C) (Temporal)    Resp 20    Ht _0  (1.549 m)    Wt 90 lb (40.8 kg)    SpO2 93%    BMI 17.01 kg/m         Assessment & Plan:  Sarah Boyd in today with chief complaint of Medical Management of Chronic Issues   1. Hypothyroidism due to acquired atrophy of thyroid  - CBC with Differential/Platelet - CMP14+EGFR - Lipid panel - Thyroid Panel With TSH  2. Age-related osteoporosis without current pathological fracture Weight bering exercises Fall prevention  3. Weight loss Drink boost daily Increase calories.    The above assessment and management plan was discussed with the patient. The patient verbalized understanding of and has agreed to the management plan. Patient is aware to call the clinic if symptoms persist or worsen.  Patient is aware when to return to the clinic for a follow-up visit. Patient educated on when it is appropriate to go to the emergency department.   Mary-Margaret Hassell Done, FNP

## 2021-05-31 LAB — CBC WITH DIFFERENTIAL/PLATELET
Basophils Absolute: 0 10*3/uL (ref 0.0–0.2)
Basos: 1 %
EOS (ABSOLUTE): 0.1 10*3/uL (ref 0.0–0.4)
Eos: 1 %
Hematocrit: 44.1 % (ref 34.0–46.6)
Hemoglobin: 14 g/dL (ref 11.1–15.9)
Immature Grans (Abs): 0 10*3/uL (ref 0.0–0.1)
Immature Granulocytes: 0 %
Lymphocytes Absolute: 2.8 10*3/uL (ref 0.7–3.1)
Lymphs: 38 %
MCH: 26.7 pg (ref 26.6–33.0)
MCHC: 31.7 g/dL (ref 31.5–35.7)
MCV: 84 fL (ref 79–97)
Monocytes Absolute: 0.6 10*3/uL (ref 0.1–0.9)
Monocytes: 8 %
Neutrophils Absolute: 3.9 10*3/uL (ref 1.4–7.0)
Neutrophils: 52 %
Platelets: 297 10*3/uL (ref 150–450)
RBC: 5.24 x10E6/uL (ref 3.77–5.28)
RDW: 13.7 % (ref 11.7–15.4)
WBC: 7.5 10*3/uL (ref 3.4–10.8)

## 2021-05-31 LAB — LIPID PANEL
Chol/HDL Ratio: 1.9 ratio (ref 0.0–4.4)
Cholesterol, Total: 189 mg/dL (ref 100–199)
HDL: 98 mg/dL (ref >39)
LDL Chol Calc (NIH): 75 mg/dL (ref 0–99)
Triglycerides: 92 mg/dL (ref 0–149)
VLDL Cholesterol Cal: 16 mg/dL (ref 5–40)

## 2021-05-31 LAB — CMP14+EGFR
ALT: 13 IU/L (ref 0–32)
AST: 26 IU/L (ref 0–40)
Albumin/Globulin Ratio: 1.6 (ref 1.2–2.2)
Albumin: 4.3 g/dL (ref 3.6–4.6)
Alkaline Phosphatase: 57 IU/L (ref 44–121)
BUN/Creatinine Ratio: 15 (ref 12–28)
BUN: 13 mg/dL (ref 8–27)
Bilirubin Total: 0.3 mg/dL (ref 0.0–1.2)
CO2: 26 mmol/L (ref 20–29)
Calcium: 9.3 mg/dL (ref 8.7–10.3)
Chloride: 102 mmol/L (ref 96–106)
Creatinine, Ser: 0.85 mg/dL (ref 0.57–1.00)
Globulin, Total: 2.7 g/dL (ref 1.5–4.5)
Glucose: 79 mg/dL (ref 70–99)
Potassium: 4.2 mmol/L (ref 3.5–5.2)
Sodium: 140 mmol/L (ref 134–144)
Total Protein: 7 g/dL (ref 6.0–8.5)
eGFR: 65 mL/min/{1.73_m2} (ref >59)

## 2021-05-31 LAB — THYROID PANEL WITH TSH
Free Thyroxine Index: 1.8 (ref 1.2–4.9)
T3 Uptake Ratio: 24 % (ref 24–39)
T4, Total: 7.5 ug/dL (ref 4.5–12.0)
TSH: 2.08 u[IU]/mL (ref 0.450–4.500)

## 2021-07-02 ENCOUNTER — Other Ambulatory Visit: Payer: Self-pay | Admitting: Nurse Practitioner

## 2021-07-02 DIAGNOSIS — J4520 Mild intermittent asthma, uncomplicated: Secondary | ICD-10-CM

## 2021-07-04 ENCOUNTER — Telehealth: Payer: Self-pay | Admitting: Nurse Practitioner

## 2021-07-04 MED ORDER — DENOSUMAB 60 MG/ML ~~LOC~~ SOSY: 60.0000 mg | PREFILLED_SYRINGE | Freq: Once | SUBCUTANEOUS | 0 refills | Status: DC

## 2021-07-04 NOTE — Telephone Encounter (Signed)
Prolia sent to pharmacy. Patient needs to pick up from pharmacy. Nurse visit appointment needs to be scheduled for pt to receive prolia once she receives the medicine.

## 2021-07-12 ENCOUNTER — Other Ambulatory Visit: Payer: Self-pay | Admitting: Nurse Practitioner

## 2021-07-12 DIAGNOSIS — J4520 Mild intermittent asthma, uncomplicated: Secondary | ICD-10-CM

## 2021-07-12 MED ORDER — DENOSUMAB 60 MG/ML ~~LOC~~ SOSY: 60.0000 mg | PREFILLED_SYRINGE | Freq: Once | SUBCUTANEOUS | 1 refills | Status: DC

## 2021-07-12 NOTE — Telephone Encounter (Signed)
Spoke with Laureen Ochs, she will call the number for [facility] - aware to call back if there are any issues.

## 2021-07-12 NOTE — Telephone Encounter (Signed)
Pt called today to check on prolia - [facility] - pt emergency contact has not heard anything from them.([facility])   [facility]ed [facility] to check status: appears on hold - this one will have to go to [facility] pharm.   Pharmacist will call specialty to make sure it goes through - I will reach out to [facility] and make sure she is aware that they will be receiving a call from specialty.    NA on [facility] cell at 1100am

## 2021-07-12 NOTE — Telephone Encounter (Signed)
Per Laureen Ochs, [facility] needed a faxed copy of the order for Prolia faxed to them.  Fax # [phone] Printed and faxed - copy in prolia notebook

## 2021-07-12 NOTE — Telephone Encounter (Signed)
[facility] stating that [facility] are being told that the first step for the Prolia is that pt or POA needs to call [phone] directly.   [facility] with # they have but NA/VM full. Will try calling again.

## 2021-07-13 NOTE — Telephone Encounter (Signed)
Fax came needing clarification on order - filled out and faxed back

## 2021-07-25 DIAGNOSIS — L309 Dermatitis, unspecified: Secondary | ICD-10-CM | POA: Diagnosis not present

## 2021-07-29 ENCOUNTER — Other Ambulatory Visit: Payer: Self-pay | Admitting: Nurse Practitioner

## 2021-07-29 DIAGNOSIS — Z1231 Encounter for screening mammogram for malignant neoplasm of breast: Secondary | ICD-10-CM

## 2021-08-02 NOTE — Telephone Encounter (Signed)
Called pt - meds came in today from [facility] - appt made for prolia injection

## 2021-08-05 ENCOUNTER — Ambulatory Visit (INDEPENDENT_AMBULATORY_CARE_PROVIDER_SITE_OTHER): Payer: Medicare HMO | Admitting: *Deleted

## 2021-08-05 DIAGNOSIS — M810 Age-related osteoporosis without current pathological fracture: Secondary | ICD-10-CM

## 2021-08-05 MED ORDER — DENOSUMAB 60 MG/ML ~~LOC~~ SOSY
60.0000 mg | PREFILLED_SYRINGE | Freq: Once | SUBCUTANEOUS | Status: AC
  Administered 2021-08-05: 60 mg via SUBCUTANEOUS

## 2021-08-05 NOTE — Progress Notes (Signed)
Pt given Prolia 60mg  inj Sub-Q r-upper arm. Pt tol tx well

## 2021-09-12 ENCOUNTER — Ambulatory Visit
Admission: RE | Admit: 2021-09-12 | Discharge: 2021-09-12 | Disposition: A | Discharge status: Home / Self Care | Payer: Medicare HMO | Source: Ambulatory Visit | Attending: Nurse Practitioner | Admitting: Nurse Practitioner

## 2021-09-12 DIAGNOSIS — Z1231 Encounter for screening mammogram for malignant neoplasm of breast: Secondary | ICD-10-CM | POA: Diagnosis not present

## 2021-12-06 ENCOUNTER — Ambulatory Visit: Payer: Medicare HMO | Admitting: Nurse Practitioner

## 2021-12-06 ENCOUNTER — Other Ambulatory Visit: Payer: Self-pay | Admitting: Nurse Practitioner

## 2021-12-06 ENCOUNTER — Encounter: Payer: Self-pay | Admitting: Nurse Practitioner

## 2021-12-06 ENCOUNTER — Ambulatory Visit (INDEPENDENT_AMBULATORY_CARE_PROVIDER_SITE_OTHER): Payer: Medicare HMO | Admitting: Nurse Practitioner

## 2021-12-06 VITALS — BP 120/61 | HR 64 | Temp 98.6°F | Resp 20 | Ht 61.0 in | Wt 88.0 lb

## 2021-12-06 DIAGNOSIS — J4520 Mild intermittent asthma, uncomplicated: Secondary | ICD-10-CM | POA: Diagnosis not present

## 2021-12-06 DIAGNOSIS — R296 Repeated falls: Secondary | ICD-10-CM | POA: Diagnosis not present

## 2021-12-06 DIAGNOSIS — M810 Age-related osteoporosis without current pathological fracture: Secondary | ICD-10-CM

## 2021-12-06 DIAGNOSIS — E034 Atrophy of thyroid (acquired): Secondary | ICD-10-CM

## 2021-12-06 MED ORDER — MONTELUKAST SODIUM 10 MG PO TABS: 10.0000 mg | ORAL_TABLET | Freq: Every day | ORAL | 1 refills | Status: DC

## 2021-12-06 MED ORDER — FLUTICASONE-SALMETEROL 100-50 MCG/ACT IN AEPB: INHALATION_SPRAY | RESPIRATORY_TRACT | 1 refills | Status: DC

## 2021-12-06 MED ORDER — LEVOTHYROXINE SODIUM 25 MCG PO TABS: 25.0000 ug | ORAL_TABLET | Freq: Every day | ORAL | 1 refills | Status: DC

## 2021-12-07 LAB — THYROID PANEL WITH TSH
Free Thyroxine Index: 2 (ref 1.2–4.9)
T3 Uptake Ratio: 26 % (ref 24–39)
T4, Total: 7.5 ug/dL (ref 4.5–12.0)
TSH: 2.33 u[IU]/mL (ref 0.450–4.500)

## 2021-12-07 LAB — CBC WITH DIFFERENTIAL/PLATELET
Basophils Absolute: 0 10*3/uL (ref 0.0–0.2)
Basos: 0 %
EOS (ABSOLUTE): 0.1 10*3/uL (ref 0.0–0.4)
Eos: 1 %
Hematocrit: 40.1 % (ref 34.0–46.6)
Hemoglobin: 13.2 g/dL (ref 11.1–15.9)
Immature Grans (Abs): 0 10*3/uL (ref 0.0–0.1)
Immature Granulocytes: 0 %
Lymphocytes Absolute: 3 10*3/uL (ref 0.7–3.1)
Lymphs: 42 %
MCH: 28 pg (ref 26.6–33.0)
MCHC: 32.9 g/dL (ref 31.5–35.7)
MCV: 85 fL (ref 79–97)
Monocytes Absolute: 0.6 10*3/uL (ref 0.1–0.9)
Monocytes: 8 %
Neutrophils Absolute: 3.4 10*3/uL (ref 1.4–7.0)
Neutrophils: 49 %
Platelets: 233 10*3/uL (ref 150–450)
RBC: 4.71 x10E6/uL (ref 3.77–5.28)
RDW: 13.8 % (ref 11.7–15.4)
WBC: 7.1 10*3/uL (ref 3.4–10.8)

## 2021-12-07 LAB — CMP14+EGFR
ALT: 17 IU/L (ref 0–32)
AST: 23 IU/L (ref 0–40)
Albumin/Globulin Ratio: 1.4 (ref 1.2–2.2)
Albumin: 4.2 g/dL (ref 3.6–4.6)
Alkaline Phosphatase: 64 IU/L (ref 44–121)
BUN/Creatinine Ratio: 16 (ref 12–28)
BUN: 12 mg/dL (ref 8–27)
Bilirubin Total: 0.3 mg/dL (ref 0.0–1.2)
CO2: 26 mmol/L (ref 20–29)
Calcium: 9.3 mg/dL (ref 8.7–10.3)
Chloride: 100 mmol/L (ref 96–106)
Creatinine, Ser: 0.75 mg/dL (ref 0.57–1.00)
Globulin, Total: 2.9 g/dL (ref 1.5–4.5)
Glucose: 106 mg/dL — ABNORMAL HIGH (ref 70–99)
Potassium: 4.6 mmol/L (ref 3.5–5.2)
Sodium: 140 mmol/L (ref 134–144)
Total Protein: 7.1 g/dL (ref 6.0–8.5)
eGFR: 76 mL/min/{1.73_m2} (ref >59)

## 2021-12-08 ENCOUNTER — Other Ambulatory Visit: Payer: Self-pay | Admitting: Nurse Practitioner

## 2021-12-08 DIAGNOSIS — J4520 Mild intermittent asthma, uncomplicated: Secondary | ICD-10-CM

## 2022-01-03 ENCOUNTER — Other Ambulatory Visit: Payer: Self-pay | Admitting: *Deleted

## 2022-01-03 MED ORDER — DENOSUMAB 60 MG/ML ~~LOC~~ SOSY: 60.0000 mg | PREFILLED_SYRINGE | Freq: Once | SUBCUTANEOUS | 0 refills | Status: AC

## 2022-01-16 ENCOUNTER — Telehealth: Payer: Self-pay | Admitting: Nurse Practitioner

## 2022-01-16 ENCOUNTER — Other Ambulatory Visit: Payer: Self-pay | Admitting: *Deleted

## 2022-01-16 NOTE — Telephone Encounter (Signed)
Noted  

## 2022-01-30 DIAGNOSIS — E859 Amyloidosis, unspecified: Secondary | ICD-10-CM | POA: Diagnosis not present

## 2022-02-01 DIAGNOSIS — C50911 Malignant neoplasm of unspecified site of right female breast: Secondary | ICD-10-CM | POA: Diagnosis not present

## 2022-02-07 DIAGNOSIS — C50911 Malignant neoplasm of unspecified site of right female breast: Secondary | ICD-10-CM | POA: Diagnosis not present

## 2022-02-24 ENCOUNTER — Ambulatory Visit (INDEPENDENT_AMBULATORY_CARE_PROVIDER_SITE_OTHER): Payer: Medicare HMO | Admitting: Family Medicine

## 2022-02-24 ENCOUNTER — Encounter: Payer: Self-pay | Admitting: Family Medicine

## 2022-02-24 ENCOUNTER — Ambulatory Visit (INDEPENDENT_AMBULATORY_CARE_PROVIDER_SITE_OTHER): Payer: Medicare HMO

## 2022-02-24 VITALS — BP 117/68 | HR 83 | Temp 98.5°F | Ht 61.0 in | Wt 88.0 lb

## 2022-02-24 DIAGNOSIS — M25531 Pain in right wrist: Secondary | ICD-10-CM

## 2022-02-24 DIAGNOSIS — M8000XA Age-related osteoporosis with current pathological fracture, unspecified site, initial encounter for fracture: Secondary | ICD-10-CM

## 2022-02-24 DIAGNOSIS — S52501A Unspecified fracture of the lower end of right radius, initial encounter for closed fracture: Secondary | ICD-10-CM | POA: Diagnosis not present

## 2022-02-24 DIAGNOSIS — M810 Age-related osteoporosis without current pathological fracture: Secondary | ICD-10-CM | POA: Diagnosis not present

## 2022-02-24 MED ORDER — DENOSUMAB 60 MG/ML ~~LOC~~ SOSY
60.0000 mg | PREFILLED_SYRINGE | Freq: Once | SUBCUTANEOUS | Status: AC
  Administered 2022-02-24: 60 mg via SUBCUTANEOUS

## 2022-02-24 NOTE — Progress Notes (Signed)
Subjective:  Patient ID: Sarah Boyd, female    DOB: 1929/07/19, 88 y.o.   MRN: [id number]  Patient Care Team: Chevis Pretty, FNP as PCP - General (Family Medicine) Sandford Craze, MD as Referring Physician (Dermatology) Star Age, MD as Attending Physician (Neurology) Shon Hough, MD as Consulting Physician (Ophthalmology)   Chief Complaint:  Fall   HPI: Sarah Boyd is a 88 y.o. female presenting on 02/24/2022 for Fall   Pt was walking to car in parking lot after receiving her Prolia injection. She fell in the parking lot.. She is complaining of right wrist pain. The fall was witnessed, she did not hit her head or have LOC.  Fall The fall occurred while walking. She landed on [place]. The point of impact was the right wrist. The pain is present in the right wrist. The pain is moderate. The symptoms are aggravated by pressure on injury, movement and rotation. Pertinent negatives include no abdominal pain, bowel incontinence, fever, headaches, hearing loss, hematuria, loss of consciousness, nausea, numbness, tingling, visual change or vomiting. She has tried nothing for the symptoms.       Relevant past medical, surgical, family, and social history reviewed and updated as indicated.  Allergies and medications reviewed and updated. Data reviewed: Chart in Epic.   Past Medical History:  Diagnosis Date   Allergy    Asthma    Cancer ([place])    Breast CA, Rt Mastectomy   History of breast cancer S/P RIGHT MASTECTOMY AND CHEMO 21 YRS AGO (APPROX.  1990)   NO RECURRENCE   HOH (hard of hearing) NO AIDS   Hypothyroidism    Labral tear of shoulder RIGHT SHOULDER   Osteoporosis    Right shoulder pain     Past Surgical History:  Procedure Laterality Date   CATARACT EXTRACTION, BILATERAL     EXCISION OF RIGHT CHEST MASS  12-19-1999   BENIGN   MASTECTOMY  1990  (APPROX.)   RIGHT BREAST CANCER -- NO RECURRENCE   ORIF BILATERAL WRIST  2009   SHOULDER  ARTHROSCOPY  12/06/2011   Procedure: ARTHROSCOPY SHOULDER;  Surgeon: Magnus Sinning, MD;  Location: [place];  Service: Orthopedics;  Laterality: Right;  WITH LABRIAL DEBRIDEMENT AND SAD INTERSCALINE BLOCK    Social History   Socioeconomic History   Marital status: Single    Spouse name: Not on file   Number of children: 0   Years of education: College   Highest education level: Not on file  Occupational History   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   Never married and no children    Social Determinants of Radio broadcast assistant Strain: Not on file  Food Insecurity: Not on file  Transportation Needs: Not on file  Physical Activity: Not on file  Stress: Not on file  Social Connections: Not on file  Intimate Partner Violence: Not on file    Outpatient Encounter Medications as of 02/24/2022  Medication Sig   Calcium Carbonate-Vitamin D 600-400 MG-UNIT tablet Take 1 tablet by  mouth daily.   cetirizine (ZYRTEC) 10 MG tablet Take 1 tablet (10 mg total) by mouth at bedtime.   fluticasone-salmeterol (WIXELA INHUB) 100-50 MCG/ACT AEPB INHALE 1 PUFF INTO THE LUNGS TWICE A DAY   hydrOXYzine (ATARAX) 10 MG tablet TAKE 1 TABLET BY MOUTH THREE TIMES A DAY AS NEEDED   levothyroxine (SYNTHROID) 25 MCG tablet Take 1 tablet (25 mcg total) by mouth daily before breakfast.   lidocaine (LIDODERM) 5 % Place 1 patch onto the skin daily. Remove & Discard patch within 12 hours or as directed by MD   montelukast (SINGULAIR) 10 MG tablet Take 1 tablet (10 mg total) by mouth at bedtime.   Multiple Vitamin (MULTIVITAMIN WITH  MINERALS) TABS tablet Take 1 tablet by mouth daily.   Soft Lens Products (SALINE SENSITIVE EYES) SOLN Place 1-2 drops into both eyes 2 (two) times daily as needed.   triamcinolone cream (KENALOG) 0.1 % Apply 1 application topically 2 (two) times daily as needed. Apply to arms, back, legs, and hands as needed; do not apply to face or groin   No facility-administered encounter medications on file as of 02/24/2022.    Allergies  Allergen Reactions   Chocolate Shortness Of Breath   Benadryl [Diphenhydramine Hcl] Other (See Comments)    Reaction:  Altered mental status     Review of Systems  Constitutional:  Negative for activity change, appetite change, chills, diaphoresis, fatigue, fever and unexpected weight change.  Eyes:  Negative for photophobia and visual disturbance.  Respiratory:  Negative for cough and shortness of breath.   Cardiovascular:  Negative for chest pain, palpitations and leg swelling.  Gastrointestinal:  Negative for abdominal pain, bowel incontinence, nausea and vomiting.  Genitourinary:  Negative for decreased urine volume, difficulty urinating and hematuria.  Musculoskeletal:  Positive for arthralgias and gait problem.  Skin:  Positive for color change.  Neurological:  Positive for weakness (generalized). Negative for tingling, loss of consciousness, numbness and headaches.  Psychiatric/Behavioral:  Negative for confusion.   All other systems reviewed and are negative.       Objective:  BP 117/68   Pulse 83   Temp 98.5 F (36.9 C) (Temporal)   Ht '5\' 1"'  (1.549 m)   Wt 88 lb (39.9 kg)   BMI 16.63 kg/m    Wt Readings from Last 3 Encounters:  02/24/22 88 lb (39.9 kg)  12/06/21 88 lb (39.9 kg)  05/30/21 90 lb (40.8 kg)    Physical Exam Vitals and nursing note reviewed.  Constitutional:      General: She is not in acute distress.    Appearance: Normal appearance. She is not ill-appearing, toxic-appearing or diaphoretic.     Comments: Frail elderly.   HENT:     Head: Normocephalic and atraumatic.     Nose: Nose normal.     Mouth/Throat:     Mouth: Mucous membranes are moist.  Eyes:     Conjunctiva/sclera: Conjunctivae normal.     Pupils: Pupils are equal, round, and reactive to light.  Cardiovascular:     Rate and Rhythm: Normal rate and regular rhythm.     Heart sounds: Normal heart sounds.  Pulmonary:     Effort: Pulmonary effort is normal.     Breath sounds: Normal breath sounds.  Musculoskeletal:     Right forearm: Normal.     Right wrist: Tenderness present. No swelling, deformity, effusion, lacerations, bony tenderness, snuff box tenderness or crepitus. Normal range of motion. Normal pulse.     Right hand:  Tenderness (lateral thumb) present. No swelling, deformity, lacerations or bony tenderness. Normal range of motion. Normal strength. Normal sensation. There is no disruption of two-point discrimination. Normal capillary refill. Normal pulse.     Cervical back: Normal range of motion and neck supple.  Skin:    General: Skin is warm and dry.     Capillary Refill: Capillary refill takes less than 2 seconds.       Neurological:     General: No focal deficit present.     Mental Status: She is alert. Mental status is at baseline.     Motor: Weakness (generalized) present.     Gait: Gait abnormal (in wheelchair).  Psychiatric:        Mood and Affect: Mood normal.        Behavior: Behavior normal.        Thought Content: Thought content normal.        Judgment: Judgment normal.     Results for orders placed or performed in visit on 12/06/21  CBC with Differential/Platelet  Result Value Ref Range   WBC 7.1 3.4 - 10.8 x10E3/uL   RBC 4.71 3.77 - 5.28 x10E6/uL   Hemoglobin 13.2 11.1 - 15.9 g/dL   Hematocrit 40.1 34.0 - 46.6 %   MCV 85 79 - 97 fL   MCH 28.0 26.6 - 33.0 pg   MCHC 32.9 31.5 - 35.7 g/dL   RDW 13.8 11.7 - 15.4 %   Platelets 233 150 - 450 x10E3/uL   Neutrophils 49 Not Estab. %   Lymphs 42 Not Estab. %    Monocytes 8 Not Estab. %   Eos 1 Not Estab. %   Basos 0 Not Estab. %   Neutrophils Absolute 3.4 1.4 - 7.0 x10E3/uL   Lymphocytes Absolute 3.0 0.7 - 3.1 x10E3/uL   Monocytes Absolute 0.6 0.1 - 0.9 x10E3/uL   EOS (ABSOLUTE) 0.1 0.0 - 0.4 x10E3/uL   Basophils Absolute 0.0 0.0 - 0.2 x10E3/uL   Immature Granulocytes 0 Not Estab. %   Immature Grans (Abs) 0.0 0.0 - 0.1 x10E3/uL  CMP14+EGFR  Result Value Ref Range   Glucose 106 (H) 70 - 99 mg/dL   BUN 12 8 - 27 mg/dL   Creatinine, Ser 0.75 0.57 - 1.00 mg/dL   eGFR 76 >59 mL/min/1.73   BUN/Creatinine Ratio 16 12 - 28   Sodium 140 134 - 144 mmol/L   Potassium 4.6 3.5 - 5.2 mmol/L   Chloride 100 96 - 106 mmol/L   CO2 26 20 - 29 mmol/L   Calcium 9.3 8.7 - 10.3 mg/dL   Total Protein 7.1 6.0 - 8.5 g/dL   Albumin 4.2 3.6 - 4.6 g/dL   Globulin, Total 2.9 1.5 - 4.5 g/dL   Albumin/Globulin Ratio 1.4 1.2 - 2.2   Bilirubin Total 0.3 0.0 - 1.2 mg/dL   Alkaline Phosphatase 64 44 - 121 IU/L   AST 23 0 - 40 IU/L   ALT 17 0 - 32 IU/L  Thyroid Panel With TSH  Result Value Ref Range   TSH 2.330 0.450 - 4.500 uIU/mL   T4, Total 7.5 4.5 - 12.0 ug/dL   T3 Uptake Ratio 26 24 - 39 %   Free Thyroxine Index 2.0 1.2 - 4.9     X-Ray: right wrist: hardware present and intact, no acute findings. Preliminary x-ray reading by Monia Pouch, FNP-C, [facility].   Pertinent labs & imaging results that were available during my care of the patient were reviewed by me and considered  in my medical decision making.  Assessment & Plan:  Gwyndolyn was seen today for fall.  Diagnoses and all orders for this visit:  Acute pain of right wrist Fall in parking lot. No LOC. Localized pain to right lateral dorsal hand and wrist. No crepitus or deformity. Imaging without acute findings. Wrist splint applied in office. Symptomatic care discussed in detail including RICE therapy. Pt aware to report new, worsening, or persistent symptoms.     Continue all other maintenance  medications.  Follow up plan: Return if symptoms worsen or fail to improve.   Continue healthy lifestyle choices, including diet (rich in fruits, vegetables, and lean proteins, and low in salt and simple carbohydrates) and exercise (at least 30 minutes of moderate physical activity daily).  Educational handout given for wrist pain  The above assessment and management plan was discussed with the patient. The patient verbalized understanding of and has agreed to the management plan. Patient is aware to call the clinic if they develop any new symptoms or if symptoms persist or worsen. Patient is aware when to return to the clinic for a follow-up visit. Patient educated on when it is appropriate to go to the emergency department.   Monia Pouch, FNP-C [facility] [phone]

## 2022-02-27 MED ORDER — DENOSUMAB 60 MG/ML ~~LOC~~ SOSY
60.0000 mg | PREFILLED_SYRINGE | Freq: Once | SUBCUTANEOUS | Status: AC
  Administered 2022-02-24: 60 mg via SUBCUTANEOUS

## 2022-02-27 NOTE — Addendum Note (Signed)
Addended by: Gerilyn Nestle on: 02/27/2022 02:40 PM   Modules accepted: Orders

## 2022-02-27 NOTE — Addendum Note (Signed)
Addended by: Gerilyn Nestle on: 02/27/2022 02:35 PM   Modules accepted: Orders

## 2022-03-06 DIAGNOSIS — M25531 Pain in right wrist: Secondary | ICD-10-CM | POA: Diagnosis not present

## 2022-05-15 ENCOUNTER — Ambulatory Visit (INDEPENDENT_AMBULATORY_CARE_PROVIDER_SITE_OTHER): Payer: Medicare HMO

## 2022-05-15 ENCOUNTER — Ambulatory Visit (INDEPENDENT_AMBULATORY_CARE_PROVIDER_SITE_OTHER): Payer: Medicare HMO | Admitting: Family Medicine

## 2022-05-15 ENCOUNTER — Encounter: Payer: Self-pay | Admitting: Family Medicine

## 2022-05-15 VITALS — BP 123/66 | HR 85 | Temp 98.6°F | Ht 61.0 in | Wt 89.0 lb

## 2022-05-15 DIAGNOSIS — R059 Cough, unspecified: Secondary | ICD-10-CM

## 2022-05-15 DIAGNOSIS — M25522 Pain in left elbow: Secondary | ICD-10-CM | POA: Diagnosis not present

## 2022-05-15 DIAGNOSIS — W19XXXA Unspecified fall, initial encounter: Secondary | ICD-10-CM

## 2022-05-15 DIAGNOSIS — S50912A Unspecified superficial injury of left forearm, initial encounter: Secondary | ICD-10-CM

## 2022-05-15 DIAGNOSIS — M19022 Primary osteoarthritis, left elbow: Secondary | ICD-10-CM | POA: Diagnosis not present

## 2022-05-15 DIAGNOSIS — M1612 Unilateral primary osteoarthritis, left hip: Secondary | ICD-10-CM | POA: Diagnosis not present

## 2022-05-15 DIAGNOSIS — S2231XA Fracture of one rib, right side, initial encounter for closed fracture: Secondary | ICD-10-CM | POA: Diagnosis not present

## 2022-05-15 NOTE — Progress Notes (Signed)
Subjective:  Patient ID: Sarah Boyd, female    DOB: 03-28-1930  Age: 88 y.o. MRN: [id number]  CC: Fall   HPI Sarah Boyd presents for fell 4 days ago. Left arm bloody from a skin tear. Fall was not witnessed. Pt. Demented.Hx from caregiver who is new to the patient. Says pt. Groans when she gets up.   Coughing frequenly for at least a week. No dyspnea. Minimal sputum. No fever.      05/15/2022   11:40 AM 12/06/2021   12:20 PM 04/07/2021    1:22 PM  Depression screen PHQ 2/9  Decreased Interest 0 1 0  Down, Depressed, Hopeless 0 0 0  PHQ - 2 Score 0 1 0  Altered sleeping  0   Tired, decreased energy  1   Change in appetite  0   Feeling bad or failure about yourself   0   Trouble concentrating  0   Moving slowly or fidgety/restless  0   Suicidal thoughts  0   PHQ-9 Score  2   Difficult doing work/chores  Not difficult at all     History Sarah Boyd has a past medical history of Allergy, Asthma, Cancer ([place]), History of breast cancer (S/P RIGHT MASTECTOMY AND CHEMO 21 YRS AGO (APPROX.  1990)), HOH (hard of hearing) (NO AIDS), Hypothyroidism, Labral tear of shoulder (RIGHT SHOULDER), Osteoporosis, and Right shoulder pain.   She has a past surgical history that includes EXCISION OF RIGHT CHEST MASS (12-19-1999); Mastectomy (1990  (APPROX.)); ORIF BILATERAL WRIST (2009); Cataract extraction, bilateral; and Shoulder arthroscopy (12/06/2011).   Her family history includes Arthritis in her sister, sister, and sister; Breast cancer in her sister; Cancer in her brother, sister, and sister; Dementia in her sister, sister, sister, and sister; Diabetes in her sister; Rheumatic fever in her father; Stroke in her mother.She reports that she has never smoked. She has never used smokeless tobacco. She reports that she does not drink alcohol and does not use drugs.    ROS Review of Systems  Constitutional: Negative.   HENT: Negative.    Eyes:  Negative for visual disturbance.   Respiratory:  Negative for shortness of breath.   Cardiovascular:  Negative for chest pain.  Gastrointestinal:  Negative for abdominal pain.  Musculoskeletal:  Negative for arthralgias.    Objective:  BP 123/66   Pulse 85   Temp 98.6 F (37 C)   Ht '5\' 1"'$  (1.549 m)   Wt 89 lb (40.4 kg)   SpO2 94%   BMI 16.82 kg/m   BP Readings from Last 3 Encounters:  05/15/22 123/66  02/24/22 117/68  12/06/21 120/61    Wt Readings from Last 3 Encounters:  05/15/22 89 lb (40.4 kg)  02/24/22 88 lb (39.9 kg)  12/06/21 88 lb (39.9 kg)     Physical Exam Constitutional:      General: She is not in acute distress.    Appearance: She is well-developed.  Cardiovascular:     Rate and Rhythm: Normal rate and regular rhythm.  Pulmonary:     Breath sounds: Normal breath sounds.  Musculoskeletal:        General: Normal range of motion.  Skin:    General: Skin is warm and dry.     Findings: Lesion (rleft forearm has healing 7 cm skin tear at ventral aspect proximally.) present.  Neurological:     Mental Status: She is alert and oriented to person, place, and time.       Assessment &  Plan:   Sarah Boyd was seen today for fall.  Diagnoses and all orders for this visit:  Fall, initial encounter -     DG Elbow 2 Views Left; Future -     DG HIP UNILAT W OR W/O PELVIS 2-3 VIEWS LEFT; Future  Cough, unspecified type -     DG Chest 2 View; Future   I am having Sarah M. Chow "Molene" maintain her Calcium Carbonate-Vitamin D, multivitamin with minerals, triamcinolone cream, Saline Sensitive Eyes, lidocaine, cetirizine, levothyroxine, fluticasone-salmeterol, montelukast, and hydrOXYzine.  Allergies as of 05/15/2022       Reactions   Chocolate Shortness Of Breath   Benadryl [diphenhydramine Hcl] Other (See Comments)   Reaction:  Altered mental status         Medication List        Accurate as of May 15, 2022  9:18 PM. If you have any questions, ask your nurse or doctor.           Calcium Carbonate-Vitamin D 600-400 MG-UNIT tablet Take 1 tablet by mouth daily.   cetirizine 10 MG tablet Commonly known as: ZYRTEC Take 1 tablet (10 mg total) by mouth at bedtime.   fluticasone-salmeterol 100-50 MCG/ACT Aepb Commonly known as: Wixela Inhub INHALE 1 PUFF INTO THE LUNGS TWICE A DAY   hydrOXYzine 10 MG tablet Commonly known as: ATARAX TAKE 1 TABLET BY MOUTH THREE TIMES A DAY AS NEEDED   levothyroxine 25 MCG tablet Commonly known as: SYNTHROID Take 1 tablet (25 mcg total) by mouth daily before breakfast.   lidocaine 5 % Commonly known as: Lidoderm Place 1 patch onto the skin daily. Remove & Discard patch within 12 hours or as directed by MD   montelukast 10 MG tablet Commonly known as: SINGULAIR Take 1 tablet (10 mg total) by mouth at bedtime.   multivitamin with minerals Tabs tablet Take 1 tablet by mouth daily.   Saline Sensitive Eyes Soln Place 1-2 drops into both eyes 2 (two) times daily as needed.   triamcinolone cream 0.1 % Commonly known as: KENALOG Apply 1 application topically 2 (two) times daily as needed. Apply to arms, back, legs, and hands as needed; do not apply to face or groin         Follow-up: Return if symptoms worsen or fail to improve.  Claretta Fraise, M.D.

## 2022-05-16 ENCOUNTER — Telehealth: Payer: Self-pay | Admitting: *Deleted

## 2022-05-16 NOTE — Telephone Encounter (Signed)
Chest xray was clear- should not need antibiotic= Use triple antibiotic Ointment OTC for skin tear

## 2022-05-16 NOTE — Telephone Encounter (Signed)
TC from Arbie Cookey, nurse w/ [facility] seeing pt today Family had question if an abx was going to be called in for pt or use neosporin for skin tear I also wondered about abx for cough & results of cxr. [facility] uses [facility] Please advise

## 2022-05-16 NOTE — Telephone Encounter (Signed)
Attempted to contact- mailboxfull

## 2022-05-17 NOTE — Telephone Encounter (Signed)
Spoke to nurse, she verbalized understanding

## 2022-06-01 ENCOUNTER — Telehealth: Payer: Self-pay | Admitting: Nurse Practitioner

## 2022-06-01 NOTE — Telephone Encounter (Signed)
Per [employer], disregard this message. They have diag code.

## 2022-06-02 DIAGNOSIS — M1380 Other specified arthritis, unspecified site: Secondary | ICD-10-CM | POA: Diagnosis not present

## 2022-06-08 ENCOUNTER — Other Ambulatory Visit: Payer: Self-pay | Admitting: Nurse Practitioner

## 2022-06-08 ENCOUNTER — Ambulatory Visit (INDEPENDENT_AMBULATORY_CARE_PROVIDER_SITE_OTHER): Payer: Medicare HMO | Admitting: Nurse Practitioner

## 2022-06-08 ENCOUNTER — Encounter: Payer: Self-pay | Admitting: Nurse Practitioner

## 2022-06-08 VITALS — BP 113/64 | HR 78 | Temp 97.8°F | Resp 20 | Ht 61.0 in | Wt 90.0 lb

## 2022-06-08 DIAGNOSIS — E034 Atrophy of thyroid (acquired): Secondary | ICD-10-CM | POA: Diagnosis not present

## 2022-06-08 DIAGNOSIS — R296 Repeated falls: Secondary | ICD-10-CM

## 2022-06-08 DIAGNOSIS — M810 Age-related osteoporosis without current pathological fracture: Secondary | ICD-10-CM

## 2022-06-08 DIAGNOSIS — J4520 Mild intermittent asthma, uncomplicated: Secondary | ICD-10-CM

## 2022-06-08 MED ORDER — MONTELUKAST SODIUM 10 MG PO TABS: 10.0000 mg | ORAL_TABLET | Freq: Every day | ORAL | 1 refills | Status: AC

## 2022-06-08 MED ORDER — CETIRIZINE HCL 10 MG PO TABS: 10.0000 mg | ORAL_TABLET | Freq: Every day | ORAL | 1 refills | Status: AC

## 2022-06-08 MED ORDER — LEVOTHYROXINE SODIUM 25 MCG PO TABS: 25.0000 ug | ORAL_TABLET | Freq: Every day | ORAL | 1 refills | Status: AC

## 2022-06-08 MED ORDER — HYDROXYZINE HCL 10 MG PO TABS: 10.0000 mg | ORAL_TABLET | Freq: Three times a day (TID) | ORAL | 1 refills | Status: AC | PRN

## 2022-06-08 MED ORDER — FLUTICASONE-SALMETEROL 100-50 MCG/ACT IN AEPB: INHALATION_SPRAY | RESPIRATORY_TRACT | 1 refills | Status: AC

## 2022-06-08 NOTE — Patient Instructions (Signed)

## 2022-06-08 NOTE — Addendum Note (Signed)
Addended by: Chevis Pretty on: 06/08/2022 11:23 AM   Modules accepted: Orders

## 2022-06-08 NOTE — Progress Notes (Signed)
Subjective:    Patient ID: Sarah Boyd, female    DOB: May 02, 1930, 88 y.o.   MRN: [id number]   Chief Complaint: medical management of chronic issues     HPI:  Sarah Boyd is a 88 y.o. who identifies as a female who was assigned female at birth.   Social history: Lives with: niece Work history: retired   Scientist, forensic in today for follow up of the following chronic medical issues:  1. Mild intermittent chronic asthma without complication Is on singular wixela and singulair. She has been doing well.   2. Hypothyroidism due to acquired atrophy of thyroid Is on synthroid and is having no issues   3. Frequent falls Caregiver says she has fallen 2 more times since last visit. She did not break anything. Hose is like a Public house manager  4. Age-related osteoporosis without current pathological fracture Last dexascan was done on 05/20/20- not going to do anymore.   New complaints: None today.  Allergies  Allergen Reactions   Chocolate Shortness Of Breath   Benadryl [Diphenhydramine Hcl] Other (See Comments)    Reaction:  Altered mental status    Outpatient Encounter Medications as of 06/08/2022  Medication Sig   Calcium Carbonate-Vitamin D 600-400 MG-UNIT tablet Take 1 tablet by mouth daily.   cetirizine (ZYRTEC) 10 MG tablet Take 1 tablet (10 mg total) by mouth at bedtime.   fluticasone-salmeterol (WIXELA INHUB) 100-50 MCG/ACT AEPB INHALE 1 PUFF INTO THE LUNGS TWICE A DAY   hydrOXYzine (ATARAX) 10 MG tablet TAKE 1 TABLET BY MOUTH THREE TIMES A DAY AS NEEDED   levothyroxine (SYNTHROID) 25 MCG tablet Take 1 tablet (25 mcg total) by mouth daily before breakfast.   lidocaine (LIDODERM) 5 % Place 1 patch onto the skin daily. Remove & Discard patch within 12 hours or as directed by MD   montelukast (SINGULAIR) 10 MG tablet Take 1 tablet (10 mg total) by mouth at bedtime.   Multiple Vitamin (MULTIVITAMIN WITH MINERALS) TABS tablet Take 1 tablet by mouth daily.   Soft Lens Products  (SALINE SENSITIVE EYES) SOLN Place 1-2 drops into both eyes 2 (two) times daily as needed.   triamcinolone cream (KENALOG) 0.1 % Apply 1 application topically 2 (two) times daily as needed. Apply to arms, back, legs, and hands as needed; do not apply to face or groin   No facility-administered encounter medications on file as of 06/08/2022.    Past Surgical History:  Procedure Laterality Date   CATARACT EXTRACTION, BILATERAL     EXCISION OF RIGHT CHEST MASS  12-19-1999   BENIGN   MASTECTOMY  1990  (APPROX.)   RIGHT BREAST CANCER -- NO RECURRENCE   ORIF BILATERAL WRIST  2009   SHOULDER ARTHROSCOPY  12/06/2011   Procedure: ARTHROSCOPY SHOULDER;  Surgeon: Magnus Sinning, MD;  Location: [facility];  Service: Orthopedics;  Laterality: Right;  WITH LABRIAL DEBRIDEMENT AND SAD INTERSCALINE BLOCK    Family History  Problem Relation Age of Onset   Stroke Mother    Rheumatic fever Father    Arthritis Sister        back and knees   Cancer Brother        prostate   Arthritis Sister        back and knees // rheumatoid   Breast cancer Sister    Cancer Sister        esophogeal   Cancer Sister        lymphoma - non hodge.  Dementia Sister    Diabetes Sister    Dementia Sister    Arthritis Sister        hips   Dementia Sister    Dementia Sister       Controlled substance contract: n/a     Review of Systems  Constitutional:  Negative for diaphoresis.  Eyes:  Negative for pain.  Respiratory:  Negative for shortness of breath.   Cardiovascular:  Negative for chest pain, palpitations and leg swelling.  Gastrointestinal:  Negative for abdominal pain.  Endocrine: Negative for polydipsia.  Skin:  Negative for rash.  Neurological:  Negative for dizziness, weakness and headaches.  Hematological:  Does not bruise/bleed easily.  All other systems reviewed and are negative.      Objective:   Physical Exam Vitals and nursing note reviewed.  Constitutional:       General: She is not in acute distress.    Appearance: Normal appearance. She is well-developed.  HENT:     Head: Normocephalic.     Right Ear: Tympanic membrane normal.     Left Ear: Tympanic membrane normal.     Nose: Nose normal.     Mouth/Throat:     Mouth: Mucous membranes are moist.  Eyes:     Pupils: Pupils are equal, round, and reactive to light.  Neck:     Vascular: No carotid bruit or JVD.  Cardiovascular:     Rate and Rhythm: Normal rate and regular rhythm.     Heart sounds: Normal heart sounds.  Pulmonary:     Effort: Pulmonary effort is normal. No respiratory distress.     Breath sounds: Normal breath sounds. No wheezing or rales.  Chest:     Chest wall: No tenderness.  Abdominal:     General: Bowel sounds are normal. There is no distension or abdominal bruit.     Palpations: Abdomen is soft. There is no hepatomegaly, splenomegaly, mass or pulsatile mass.     Tenderness: There is no abdominal tenderness.  Musculoskeletal:        General: Normal range of motion.     Cervical back: Normal range of motion and neck supple.     Comments: Walking with walker- slow and steady  Lymphadenopathy:     Cervical: No cervical adenopathy.  Skin:    General: Skin is warm and dry.  Neurological:     Mental Status: She is alert and oriented to person, place, and time.     Deep Tendon Reflexes: Reflexes are normal and symmetric.  Psychiatric:        Behavior: Behavior normal.        Thought Content: Thought content normal.        Judgment: Judgment normal.    BP 113/64   Pulse 78   Temp 97.8 F (36.6 C) (Temporal)   Resp 20   Ht '5\' 1"'$  (1.549 m)   Wt 90 lb (40.8 kg)   SpO2 95%   BMI 17.01 kg/m         Assessment & Plan:   SIAN ROCKERS comes in today with chief complaint of Medical Management of Chronic Issues   Diagnosis and orders addressed:  1. Mild intermittent chronic asthma without complication Continue inhalers - hydrOXYzine (ATARAX) 10 MG tablet;  Take 1 tablet (10 mg total) by mouth 3 (three) times daily as needed.  Dispense: 270 tablet; Refill: 1 - fluticasone-salmeterol (WIXELA INHUB) 100-50 MCG/ACT AEPB; INHALE 1 PUFF INTO THE LUNGS TWICE A DAY  Dispense: 180  each; Refill: 1 - montelukast (SINGULAIR) 10 MG tablet; Take 1 tablet (10 mg total) by mouth at bedtime.  Dispense: 90 tablet; Refill: 1 - cetirizine (ZYRTEC) 10 MG tablet; Take 1 tablet (10 mg total) by mouth at bedtime.  Dispense: 90 tablet; Refill: 1  2. Hypothyroidism due to acquired atrophy of thyroid Labs pending - levothyroxine (SYNTHROID) 25 MCG tablet; Take 1 tablet (25 mcg total) by mouth daily before breakfast.  Dispense: 90 tablet; Refill: 1  3. Frequent falls Fall prevention  4. Age-related osteoporosis without current pathological fracture   Labs pending Health Maintenance reviewed Diet and exercise encouraged  Follow up plan: 6 months   [place], FNP

## 2022-06-20 ENCOUNTER — Telehealth: Payer: Self-pay | Admitting: Nurse Practitioner

## 2022-06-20 NOTE — Telephone Encounter (Signed)
Left message for patient to call back and schedule Medicare Annual Wellness Visit (AWV) to be completed by video or phone.   Last AWV: 04/07/2021   Please schedule at anytime with [place]     45 minute appointment  Any questions, please contact me at [phone]   Thank you,   [facility] Ambulatory Clinical Support for [place] [facility] ??[phone] or ??[phone]

## 2022-07-20 ENCOUNTER — Telehealth: Payer: Self-pay | Admitting: Nurse Practitioner

## 2022-07-20 NOTE — Telephone Encounter (Signed)
  Prescription Request  07/20/2022  Is this a "Controlled Substance" medicine? no  Have you seen your PCP in the last 2 weeks? no  If YES, route message to pool  -  If NO, patient needs to be scheduled for appointment.  What is the name of the medication or equipment? Prolia?  Have you contacted your pharmacy to request a refill? yes   Which pharmacy would you like this sent to? [facility]   Patient notified that their request is being sent to the clinical staff for review and that they should receive a response within 2 business days.   MMM's pt.  It is due?

## 2022-07-26 ENCOUNTER — Ambulatory Visit (INDEPENDENT_AMBULATORY_CARE_PROVIDER_SITE_OTHER): Payer: Medicare HMO

## 2022-07-26 VITALS — Ht 61.0 in | Wt 90.0 lb

## 2022-07-26 DIAGNOSIS — Z1231 Encounter for screening mammogram for malignant neoplasm of breast: Secondary | ICD-10-CM

## 2022-07-26 DIAGNOSIS — Z0000 Encounter for general adult medical examination without abnormal findings: Secondary | ICD-10-CM

## 2022-07-26 NOTE — Patient Instructions (Signed)
Sarah Boyd , Thank you for taking time to come for your Medicare Wellness Visit. I appreciate your ongoing commitment to your health goals. Please review the following plan we discussed and let me know if I can assist you in the future.   These are the goals we discussed:  Goals      Exercise 150 minutes per week (moderate activity)     Gain weight     98 lbs today, would like to be over 87 lb      Increase physical activity     Return back to [facility] dept for activities     Patient Stated     04/07/2021 AWV Goal: Fall Prevention  Over the next year, patient will decrease their risk for falls by: Using assistive devices, such as a cane or walker, as needed Identifying fall risks within their home and correcting them by: Removing throw rugs Adding handrails to stairs or ramps Removing clutter and keeping a clear pathway throughout the home Increasing light, especially at night Adding shower handles/bars Raising toilet seat Identifying potential personal risk factors for falls: Medication side effects Incontinence/urgency Vestibular dysfunction Hearing loss Musculoskeletal disorders Neurological disorders Orthostatic hypotension       Prevent falls        This is a list of the screening recommended for you and due dates:  Health Maintenance  Topic Date Due   COVID-19 Vaccine (4 - 2023-24 season) 02/10/2022   Zoster (Shingles) Vaccine (1 of 2) 09/07/2022*   Flu Shot  09/10/2022*   Mammogram  09/13/2022   Medicare Annual Wellness Visit  07/27/2023   DTaP/Tdap/Td vaccine (3 - Td or Tdap) 10/31/2029   Pneumonia Vaccine  Completed   DEXA scan (bone density measurement)  Completed   HPV Vaccine  Aged Out  *Topic was postponed. The date shown is not the original due date.    Advanced directives: Please bring a copy of your health care power of attorney and living will to the office to be added to your chart at your convenience.   Conditions/risks identified: Aim for 30  minutes of exercise or brisk walking, 6-8 glasses of water, and 5 servings of fruits and vegetables each day.   Next appointment: Follow up in one year for your annual wellness visit   The number to schedule your mammogram at [facility] is [phone]   Preventive Care 65 Years and Older, Female Preventive care refers to lifestyle choices and visits with your health care provider that can promote health and wellness. What does preventive care include? A yearly physical exam. This is also called an annual well check. Dental exams once or twice a year. Routine eye exams. Ask your health care provider how often you should have your eyes checked. Personal lifestyle choices, including: Daily care of your teeth and gums. Regular physical activity. Eating a healthy diet. Avoiding tobacco and drug use. Limiting alcohol use. Practicing safe sex. Taking low-dose aspirin every day. Taking vitamin and mineral supplements as recommended by your health care provider. What happens during an annual well check? The services and screenings done by your health care provider during your annual well check will depend on your age, overall health, lifestyle risk factors, and family history of disease. Counseling  Your health care provider may ask you questions about your: Alcohol use. Tobacco use. Drug use. Emotional well-being. Home and relationship well-being. Sexual activity. Eating habits. History of falls. Memory and ability to understand (cognition). Work and work Statistician. Reproductive  health. Screening  You may have the following tests or measurements: Height, weight, and BMI. Blood pressure. Lipid and cholesterol levels. These may be checked every 5 years, or more frequently if you are over 71 years old. Skin check. Lung cancer screening. You may have this screening every year starting at age 87 if you have a 30-pack-year history of smoking and currently smoke or have quit  within the past 15 years. Fecal occult blood test (FOBT) of the stool. You may have this test every year starting at age 87. Flexible sigmoidoscopy or colonoscopy. You may have a sigmoidoscopy every 5 years or a colonoscopy every 10 years starting at age 87. Hepatitis C blood test. Hepatitis B blood test. Sexually transmitted disease (STD) testing. Diabetes screening. This is done by checking your blood sugar (glucose) after you have not eaten for a while (fasting). You may have this done every 1-3 years. Bone density scan. This is done to screen for osteoporosis. You may have this done starting at age 87. Mammogram. This may be done every 1-2 years. Talk to your health care provider about how often you should have regular mammograms. Talk with your health care provider about your test results, treatment options, and if necessary, the need for more tests. Vaccines  Your health care provider may recommend certain vaccines, such as: Influenza vaccine. This is recommended every year. Tetanus, diphtheria, and acellular pertussis (Tdap, Td) vaccine. You may need a Td booster every 10 years. Zoster vaccine. You may need this after age 87. Pneumococcal 13-valent conjugate (PCV13) vaccine. One dose is recommended after age 87. Pneumococcal polysaccharide (PPSV23) vaccine. One dose is recommended after age 87. Talk to your health care provider about which screenings and vaccines you need and how often you need them. This information is not intended to replace advice given to you by your health care provider. Make sure you discuss any questions you have with your health care provider. Document Released: 06/25/2015 Document Revised: 02/16/2016 Document Reviewed: 03/30/2015 Elsevier Interactive Patient Education  2017 [place] Prevention in the Home Falls can cause injuries. They can happen to people of all ages. There are many things you can do to make your home safe and to help prevent  falls. What can I do on the outside of my home? Regularly fix the edges of walkways and driveways and fix any cracks. Remove anything that might make you trip as you walk through a door, such as a raised step or threshold. Trim any bushes or trees on the path to your home. Use bright outdoor lighting. Clear any walking paths of anything that might make someone trip, such as rocks or tools. Regularly check to see if handrails are loose or broken. Make sure that both sides of any steps have handrails. Any raised decks and porches should have guardrails on the edges. Have any leaves, snow, or ice cleared regularly. Use sand or salt on walking paths during winter. Clean up any spills in your garage right away. This includes oil or grease spills. What can I do in the bathroom? Use night lights. Install grab bars by the toilet and in the tub and shower. Do not use towel bars as grab bars. Use non-skid mats or decals in the tub or shower. If you need to sit down in the shower, use a plastic, non-slip stool. Keep the floor dry. Clean up any water that spills on the floor as soon as it happens. Remove soap buildup in the tub  or shower regularly. Attach bath mats securely with double-sided non-slip rug tape. Do not have throw rugs and other things on the floor that can make you trip. What can I do in the bedroom? Use night lights. Make sure that you have a light by your bed that is easy to reach. Do not use any sheets or blankets that are too big for your bed. They should not hang down onto the floor. Have a firm chair that has side arms. You can use this for support while you get dressed. Do not have throw rugs and other things on the floor that can make you trip. What can I do in the kitchen? Clean up any spills right away. Avoid walking on wet floors. Keep items that you use a lot in easy-to-reach places. If you need to reach something above you, use a strong step stool that has a grab  bar. Keep electrical cords out of the way. Do not use floor polish or wax that makes floors slippery. If you must use wax, use non-skid floor wax. Do not have throw rugs and other things on the floor that can make you trip. What can I do with my stairs? Do not leave any items on the stairs. Make sure that there are handrails on both sides of the stairs and use them. Fix handrails that are broken or loose. Make sure that handrails are as long as the stairways. Check any carpeting to make sure that it is firmly attached to the stairs. Fix any carpet that is loose or worn. Avoid having throw rugs at the top or bottom of the stairs. If you do have throw rugs, attach them to the floor with carpet tape. Make sure that you have a light switch at the top of the stairs and the bottom of the stairs. If you do not have them, ask someone to add them for you. What else can I do to help prevent falls? Wear shoes that: Do not have high heels. Have rubber bottoms. Are comfortable and fit you well. Are closed at the toe. Do not wear sandals. If you use a stepladder: Make sure that it is fully opened. Do not climb a closed stepladder. Make sure that both sides of the stepladder are locked into place. Ask someone to hold it for you, if possible. Clearly mark and make sure that you can see: Any grab bars or handrails. First and last steps. Where the edge of each step is. Use tools that help you move around (mobility aids) if they are needed. These include: Canes. Walkers. Scooters. Crutches. Turn on the lights when you go into a dark area. Replace any light bulbs as soon as they burn out. Set up your furniture so you have a clear path. Avoid moving your furniture around. If any of your floors are uneven, fix them. If there are any pets around you, be aware of where they are. Review your medicines with your doctor. Some medicines can make you feel dizzy. This can increase your chance of falling. Ask  your doctor what other things that you can do to help prevent falls. This information is not intended to replace advice given to you by your health care provider. Make sure you discuss any questions you have with your health care provider. Document Released: 03/25/2009 Document Revised: 11/04/2015 Document Reviewed: 07/03/2014 Elsevier Interactive Patient Education  2017 [employer].

## 2022-07-26 NOTE — Progress Notes (Signed)
Subjective:   Sarah Boyd is a 89 y.o. female who presents for Medicare Annual (Subsequent) preventive examination.  I connected with  OLAYINKA NOHL on 07/26/22 by a audio enabled telemedicine application and verified that I am speaking with the correct person using two identifiers.  Patient Location: Home  Provider Location: Home Office  I discussed the limitations of evaluation and management by telemedicine. The patient expressed understanding and agreed to proceed.  Review of Systems     Cardiac Risk Factors include: advanced age (>44mn, >>5women);sedentary lifestyle     Objective:    Today's Vitals   07/26/22 1028  Weight: 90 lb (40.8 kg)  Height: 5' 1"$  (1.549 m)   Body mass index is 17.01 kg/m.     07/26/2022    3:00 PM 04/07/2021    1:18 PM 11/01/2019   11:47 AM 02/26/2018   12:07 PM 12/27/2017    2:24 PM 01/03/2017    9:44 PM 12/06/2016    4:04 PM  Advanced Directives  Does Patient Have a Medical Advance Directive? No No No No No No No  Would patient like information on creating a medical advance directive? No - Patient declined No - Patient declined  No - Patient declined Yes (MAU/Ambulatory/Procedural Areas - Information given)  Yes (MAU/Ambulatory/Procedural Areas - Information given)    Current Medications (verified) Outpatient Encounter Medications as of 07/26/2022  Medication Sig   Calcium Carbonate-Vitamin D 600-400 MG-UNIT tablet Take 1 tablet by mouth daily.   cetirizine (ZYRTEC) 10 MG tablet Take 1 tablet (10 mg total) by mouth at bedtime.   hydrOXYzine (ATARAX) 10 MG tablet Take 1 tablet (10 mg total) by mouth 3 (three) times daily as needed.   levothyroxine (SYNTHROID) 25 MCG tablet Take 1 tablet (25 mcg total) by mouth daily before breakfast.   lidocaine (LIDODERM) 5 % Place 1 patch onto the skin daily. Remove & Discard patch within 12 hours or as directed by MD   montelukast (SINGULAIR) 10 MG tablet Take 1 tablet (10 mg total) by mouth at  bedtime.   Multiple Vitamin (MULTIVITAMIN WITH MINERALS) TABS tablet Take 1 tablet by mouth daily.   Soft Lens Products (SALINE SENSITIVE EYES) SOLN Place 1-2 drops into both eyes 2 (two) times daily as needed.   triamcinolone cream (KENALOG) 0.1 % Apply 1 application  topically 2 (two) times daily as needed. Apply to arms, back, legs, and hands as needed; do not apply to face or groin   fluticasone-salmeterol (WIXELA INHUB) 100-50 MCG/ACT AEPB INHALE 1 PUFF INTO THE LUNGS TWICE A DAY (Patient not taking: Reported on 07/26/2022)   No facility-administered encounter medications on file as of 07/26/2022.    Allergies (verified) Chocolate and Benadryl [diphenhydramine hcl]   History: Past Medical History:  Diagnosis Date   Allergy    Asthma    Cancer ([place]    Breast CA, Rt Mastectomy   History of breast cancer S/P RIGHT MASTECTOMY AND CHEMO 21 YRS AGO (APPROX.  1990)   NO RECURRENCE   HOH (hard of hearing) NO AIDS   Hypothyroidism    Labral tear of shoulder RIGHT SHOULDER   Osteoporosis    Right shoulder pain    Past Surgical History:  Procedure Laterality Date   CATARACT EXTRACTION, BILATERAL     EXCISION OF RIGHT CHEST MASS  12-19-1999   BENIGN   MASTECTOMY  1990  (APPROX.)   RIGHT BREAST CANCER -- NO RECURRENCE   ORIF BILATERAL WRIST  2009  SHOULDER ARTHROSCOPY  12/06/2011   Procedure: ARTHROSCOPY SHOULDER;  Surgeon: Magnus Sinning, MD;  Location: [facility];  Service: Orthopedics;  Laterality: Right;  WITH LABRIAL DEBRIDEMENT AND SAD INTERSCALINE BLOCK   Family History  Problem Relation Age of Onset   Stroke Mother    Rheumatic fever Father    Arthritis Sister        back and knees   Cancer Brother        prostate   Arthritis Sister        back and knees // rheumatoid   Breast cancer Sister    Cancer Sister        esophogeal   Cancer Sister        lymphoma - non hodge.   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   Never married and no children    Social Determinants of Radio broadcast assistant Strain: Not on file  Food Insecurity: Not on file  Transportation Needs: Not on file  Physical Activity: Not on file  Stress: Not on file  Social Connections: Not on file    Tobacco Counseling Counseling given: Not Answered   Clinical Intake:  Pre-visit preparation completed: Yes  Pain : No/denies pain  Diabetes: No  How often do you need to have someone help you when you read instructions, pamphlets, or other written materials from your doctor or pharmacy?: 3 - Sometimes  Diabetic?No   Interpreter Needed?: No  Comments: Assisted with visit by niece-Fleta who is caretaker Information entered by :: Denman George LPN   Activities of Daily Living    07/26/2022    3:07 PM  In your present state of health, do you have any difficulty performing the following activities:  Hearing? 0  Vision? 0  Difficulty concentrating or making decisions? 0  Walking or climbing stairs? 0  Dressing or bathing? 0  Doing errands, shopping? 0  Preparing Food and eating ? N  Using the Toilet? N  In the past six months, have you accidently leaked urine? N  Do you have problems with loss of bowel control? N  Managing your Medications? N   Managing your Finances? N  Housekeeping or managing your Housekeeping? N    Patient Care Team: Chevis Pretty, FNP as PCP - General (Family Medicine) Sandford Craze, MD as Referring Physician (Dermatology) Star Age, MD as Attending Physician (Neurology) Shon Hough, MD as Consulting Physician (Ophthalmology)  Indicate any recent Medical Services you may have received from other than [facility] providers in the past year (date may be approximate).  Assessment:   This is a routine wellness examination for Brenya.  Hearing/Vision screen Hearing Screening - Comments:: Hard of hearing  Vision Screening - Comments:: Denies vision problems   Dietary issues and exercise activities discussed: Current Exercise Habits: The patient does not participate in regular exercise at present   Goals Addressed             This Visit's Progress    COMPLETED: Patient Stated       04/07/2021 AWV Goal: Fall Prevention  Over the next year, patient will decrease their risk for falls by: Using assistive devices, such as a cane or walker, as needed Identifying fall risks within their home and correcting them by: Removing throw rugs Adding handrails to stairs or ramps Removing clutter and keeping a clear pathway throughout the home Increasing light, especially at night Adding shower handles/bars Raising toilet seat Identifying potential personal risk factors for falls: Medication side effects Incontinence/urgency Vestibular dysfunction Hearing loss Musculoskeletal disorders Neurological disorders Orthostatic hypotension        Depression Screen    06/08/2022   11:03 AM 05/15/2022   11:40 AM 12/06/2021   12:20 PM 04/07/2021    1:22 PM 11/16/2020    2:17 PM 05/18/2020    2:35 PM 06/16/2019   11:08 AM  PHQ 2/9 Scores  PHQ - 2 Score 0 0 1 0 0 0 0  PHQ- 9 Score 4  2  1      $ Fall Risk    07/26/2022   10:30 AM 06/08/2022   11:02 AM 05/15/2022   11:39 AM 12/06/2021    12:20 PM 04/07/2021    1:21 PM  Fall Risk   Falls in the past year? 1 1 1 1 $ 0  Number falls in past yr: 1 1 1 $ 0   Injury with Fall? 1 1 1 $ 0   Risk for fall due to : History of fall(s);Impaired balance/gait;Impaired mobility;Mental status change History of fall(s) History of fall(s);Impaired balance/gait;Impaired mobility History of fall(s)   Follow up Education provided;Falls prevention discussed;Falls evaluation completed Education provided Falls evaluation completed Education provided Falls evaluation completed    [place]:  Any stairs in or around the home? No  If so, are there any without handrails? No  Home free of loose throw rugs in walkways, pet beds, electrical cords, etc? Yes  Adequate lighting in your home to reduce risk of falls? Yes   ASSISTIVE DEVICES UTILIZED TO PREVENT FALLS:  Life alert? No  Use of a cane, walker or w/c? Yes  Grab bars in the bathroom? Yes  Shower chair or bench in shower? No  Elevated toilet seat or a handicapped toilet? Yes   TIMED UP AND GO:  Was the test performed? No . Telephonic visit   Cognitive Function:    12/27/2017    2:30 PM 12/06/2016    3:49 PM  MMSE - Mini Mental State Exam  Orientation to time 5 5  Orientation to Place 5 4  Registration 3 3  Attention/ Calculation 5 3  Recall 2 0  Language- name 2 objects 2 0  Language- repeat 1 1  Language- follow 3 step command 2 3  Language- read & follow direction 1 1  Write a sentence 1 1  Copy design 1 0  Total score 28 21        07/26/2022    3:08 PM 04/07/2021    1:19 PM  6CIT Screen  What Year? 4 points  4 points  What month? 3 points 3 points  What time? 3 points 0 points  Count back from 20 2 points 0 points  Months in reverse 2 points 0 points  Repeat phrase 4 points 4 points  Total Score 18 points 11 points    Immunizations Immunization History  Administered Date(s) Administered   Fluad Quad(high Dose 65+) 05/13/2019,  05/18/2020, 04/08/2021   Influenza, High Dose Seasonal PF 05/08/2016, 05/08/2016, 03/08/2018, 03/08/2018   Influenza,inj,Quad PF,6+ Mos 04/28/2013, 04/09/2014, 04/22/2015   Moderna Sars-Covid-2 Vaccination 04/12/2020   PFIZER(Purple Top)SARS-COV-2 Vaccination 08/07/2019, 08/27/2019   Pneumococcal Conjugate-13 04/22/2015   Pneumococcal Polysaccharide-23 01/22/2014   Tdap 12/06/2016, 11/01/2019    TDAP status: Up to date  Flu Vaccine status: Due, Education has been provided regarding the importance of this vaccine. Advised may receive this vaccine at local pharmacy or Health Dept. Aware to provide a copy of the vaccination record if obtained from local pharmacy or Health Dept. Verbalized acceptance and understanding.  Pneumococcal vaccine status: Up to date  Covid-19 vaccine status: Information provided on how to obtain vaccines.   Qualifies for Shingles Vaccine? Yes   Zostavax completed No   Shingrix Completed?: No.    Education has been provided regarding the importance of this vaccine. Patient has been advised to call insurance company to determine out of pocket expense if they have not yet received this vaccine. Advised may also receive vaccine at local pharmacy or Health Dept. Verbalized acceptance and understanding.  Screening Tests Health Maintenance  Topic Date Due   COVID-19 Vaccine (4 - 2023-24 season) 02/10/2022   Zoster Vaccines- Shingrix (1 of 2) 09/07/2022 (Originally 02/13/1980)   INFLUENZA VACCINE  09/10/2022 (Originally 01/10/2022)   MAMMOGRAM  09/13/2022   Medicare Annual Wellness (AWV)  07/27/2023   DTaP/Tdap/Td (3 - Td or Tdap) 10/31/2029   Pneumonia Vaccine 54+ Years old  Completed   DEXA SCAN  Completed   HPV VACCINES  Aged Out    Health Maintenance  Health Maintenance Due  Topic Date Due   COVID-19 Vaccine (4 - 2023-24 season) 02/10/2022    Colorectal cancer screening: No longer required.   Mammogram status: Ordered today for [facility]. Pt  provided with contact info and advised to call to schedule appt.   Bone Density status: Completed 05/20/20. Results reflect: Bone density results: OSTEOPOROSIS. Repeat every 2 years.  Lung Cancer Screening: (Low Dose CT Chest recommended if Age 39-80 years, 30 pack-year currently smoking OR have quit w/in 15years.) does not qualify.   Lung Cancer Screening Referral: n/a   Additional Screening:  Hepatitis C Screening: does not qualify; Completed n/a   Vision Screening: Recommended annual ophthalmology exams for early detection of glaucoma and other disorders of the eye. Is the patient up to date with their annual eye exam?  No  Who is the provider or what is the name of the office in which the patient attends annual eye exams? None  If pt is not established with a provider, would they like to be referred to a provider to establish care? No .   Dental Screening: Recommended annual dental exams for proper oral hygiene  Community Resource Referral / Chronic Care Management: CRR required this visit?  No   CCM required this visit?  No      Plan:     I have personally reviewed and noted the following in the patient's chart:   Medical and social history Use of alcohol, tobacco or illicit drugs  Current medications  and supplements including opioid prescriptions. Patient is not currently taking opioid prescriptions. Functional ability and status Nutritional status Physical activity Advanced directives List of other physicians Hospitalizations, surgeries, and ER visits in previous 12 months Vitals Screenings to include cognitive, depression, and falls Referrals and appointments  In addition, I have reviewed and discussed with patient certain preventive protocols, quality metrics, and best practice recommendations. A written personalized care plan for preventive services as well as general preventive health recommendations were provided to patient.     Vanetta Mulders,  [place]   [id number]   Due to this being a virtual visit, the after visit summary with patients personalized plan was offered to patient via mail or my-chart.   per request, patient was mailed a copy of AVS.  Nurse Notes: No concerns

## 2022-07-28 MED ORDER — DENOSUMAB 60 MG/ML ~~LOC~~ SOSY: 60.0000 mg | PREFILLED_SYRINGE | SUBCUTANEOUS | 0 refills | Status: AC

## 2022-08-02 DIAGNOSIS — D239 Other benign neoplasm of skin, unspecified: Secondary | ICD-10-CM | POA: Diagnosis not present

## 2022-08-02 DIAGNOSIS — E859 Amyloidosis, unspecified: Secondary | ICD-10-CM | POA: Diagnosis not present

## 2022-08-02 DIAGNOSIS — Z1283 Encounter for screening for malignant neoplasm of skin: Secondary | ICD-10-CM | POA: Diagnosis not present

## 2022-08-09 ENCOUNTER — Telehealth: Payer: Self-pay | Admitting: Nurse Practitioner

## 2022-08-11 NOTE — Telephone Encounter (Signed)
Form on PCP's desk

## 2022-08-14 ENCOUNTER — Encounter: Payer: Self-pay | Admitting: Nurse Practitioner

## 2022-08-15 NOTE — Telephone Encounter (Signed)
PCS form faxed back to [facility] today

## 2022-08-29 ENCOUNTER — Ambulatory Visit: Payer: Medicare HMO

## 2022-09-25 ENCOUNTER — Telehealth: Payer: Self-pay

## 2022-09-25 ENCOUNTER — Other Ambulatory Visit (HOSPITAL_COMMUNITY): Payer: Self-pay

## 2022-09-25 NOTE — Telephone Encounter (Signed)
Prolia VOB initiated via [email or web address] 

## 2022-10-17 ENCOUNTER — Other Ambulatory Visit (HOSPITAL_COMMUNITY): Payer: Self-pay

## 2022-10-17 NOTE — Telephone Encounter (Signed)
Pharmacy Patient Advocate Encounter   PA for Prolia submitted on 10/17/22 to (ins) [employer] via CoverMyMeds Key or [facility]) confirmation # [id number] Status is pending

## 2022-10-18 ENCOUNTER — Other Ambulatory Visit (HOSPITAL_COMMUNITY): Payer: Self-pay

## 2022-10-18 NOTE — Telephone Encounter (Signed)
Patient Advocate Encounter  Prior Authorization for Prolia 60mg  has been approved with [employer].    PA# [id number] Effective dates: 09/03/19 through 06/12/23  Per [facility] test claim, refill too soon

## 2022-12-11 ENCOUNTER — Encounter: Payer: Self-pay | Admitting: Nurse Practitioner

## 2022-12-11 ENCOUNTER — Ambulatory Visit: Payer: Medicare HMO | Admitting: Nurse Practitioner

## 2023-01-15 ENCOUNTER — Other Ambulatory Visit: Payer: Self-pay | Admitting: Nurse Practitioner

## 2023-01-22 DIAGNOSIS — D485 Neoplasm of uncertain behavior of skin: Secondary | ICD-10-CM | POA: Diagnosis not present

## 2023-03-13 ENCOUNTER — Ambulatory Visit: Payer: Medicare HMO | Admitting: Nurse Practitioner

## 2023-03-13 DIAGNOSIS — I4891 Unspecified atrial fibrillation: Secondary | ICD-10-CM | POA: Diagnosis not present

## 2023-03-13 DIAGNOSIS — E860 Dehydration: Secondary | ICD-10-CM | POA: Diagnosis not present

## 2023-03-13 DIAGNOSIS — R531 Weakness: Secondary | ICD-10-CM | POA: Diagnosis not present

## 2023-03-13 DIAGNOSIS — R7989 Other specified abnormal findings of blood chemistry: Secondary | ICD-10-CM | POA: Diagnosis not present

## 2023-03-13 DIAGNOSIS — R262 Difficulty in walking, not elsewhere classified: Secondary | ICD-10-CM | POA: Diagnosis not present

## 2023-03-13 DIAGNOSIS — R1312 Dysphagia, oropharyngeal phase: Secondary | ICD-10-CM | POA: Diagnosis not present

## 2023-03-13 DIAGNOSIS — N390 Urinary tract infection, site not specified: Secondary | ICD-10-CM | POA: Diagnosis not present

## 2023-03-13 DIAGNOSIS — Z66 Do not resuscitate: Secondary | ICD-10-CM | POA: Diagnosis not present

## 2023-03-13 DIAGNOSIS — A419 Sepsis, unspecified organism: Secondary | ICD-10-CM | POA: Diagnosis not present

## 2023-03-13 DIAGNOSIS — I2489 Other forms of acute ischemic heart disease: Secondary | ICD-10-CM | POA: Diagnosis not present

## 2023-03-13 DIAGNOSIS — E43 Unspecified severe protein-calorie malnutrition: Secondary | ICD-10-CM | POA: Diagnosis not present

## 2023-03-13 DIAGNOSIS — N179 Acute kidney failure, unspecified: Secondary | ICD-10-CM | POA: Diagnosis not present

## 2023-03-13 DIAGNOSIS — R2689 Other abnormalities of gait and mobility: Secondary | ICD-10-CM | POA: Diagnosis not present

## 2023-03-13 DIAGNOSIS — E039 Hypothyroidism, unspecified: Secondary | ICD-10-CM | POA: Diagnosis not present

## 2023-03-13 DIAGNOSIS — R54 Age-related physical debility: Secondary | ICD-10-CM | POA: Diagnosis not present

## 2023-03-13 DIAGNOSIS — I672 Cerebral atherosclerosis: Secondary | ICD-10-CM | POA: Diagnosis not present

## 2023-03-13 DIAGNOSIS — Z792 Long term (current) use of antibiotics: Secondary | ICD-10-CM | POA: Diagnosis not present

## 2023-03-13 DIAGNOSIS — R4182 Altered mental status, unspecified: Secondary | ICD-10-CM | POA: Diagnosis not present

## 2023-03-13 DIAGNOSIS — J45909 Unspecified asthma, uncomplicated: Secondary | ICD-10-CM | POA: Diagnosis not present

## 2023-03-13 DIAGNOSIS — R000 Tachycardia, unspecified: Secondary | ICD-10-CM | POA: Diagnosis not present

## 2023-03-13 DIAGNOSIS — M6281 Muscle weakness (generalized): Secondary | ICD-10-CM | POA: Diagnosis not present

## 2023-03-13 DIAGNOSIS — M25552 Pain in left hip: Secondary | ICD-10-CM | POA: Diagnosis not present

## 2023-03-13 DIAGNOSIS — G9341 Metabolic encephalopathy: Secondary | ICD-10-CM | POA: Diagnosis not present

## 2023-03-13 DIAGNOSIS — R410 Disorientation, unspecified: Secondary | ICD-10-CM | POA: Diagnosis not present

## 2023-03-13 DIAGNOSIS — R638 Other symptoms and signs concerning food and fluid intake: Secondary | ICD-10-CM | POA: Diagnosis not present

## 2023-03-13 DIAGNOSIS — W19XXXD Unspecified fall, subsequent encounter: Secondary | ICD-10-CM | POA: Diagnosis not present

## 2023-03-13 DIAGNOSIS — R0689 Other abnormalities of breathing: Secondary | ICD-10-CM | POA: Diagnosis not present

## 2023-03-13 DIAGNOSIS — Z681 Body mass index (BMI) 19 or less, adult: Secondary | ICD-10-CM | POA: Diagnosis not present

## 2023-03-13 DIAGNOSIS — R41841 Cognitive communication deficit: Secondary | ICD-10-CM | POA: Diagnosis not present

## 2023-03-14 DIAGNOSIS — N390 Urinary tract infection, site not specified: Secondary | ICD-10-CM | POA: Diagnosis not present

## 2023-03-14 DIAGNOSIS — R7989 Other specified abnormal findings of blood chemistry: Secondary | ICD-10-CM | POA: Diagnosis not present

## 2023-03-14 DIAGNOSIS — Z792 Long term (current) use of antibiotics: Secondary | ICD-10-CM | POA: Diagnosis not present

## 2023-03-14 DIAGNOSIS — J45909 Unspecified asthma, uncomplicated: Secondary | ICD-10-CM | POA: Diagnosis not present

## 2023-03-14 DIAGNOSIS — W19XXXD Unspecified fall, subsequent encounter: Secondary | ICD-10-CM | POA: Diagnosis not present

## 2023-03-14 DIAGNOSIS — R2689 Other abnormalities of gait and mobility: Secondary | ICD-10-CM | POA: Diagnosis not present

## 2023-03-14 DIAGNOSIS — G9341 Metabolic encephalopathy: Secondary | ICD-10-CM | POA: Diagnosis not present

## 2023-03-14 DIAGNOSIS — R54 Age-related physical debility: Secondary | ICD-10-CM | POA: Diagnosis not present

## 2023-03-14 DIAGNOSIS — E039 Hypothyroidism, unspecified: Secondary | ICD-10-CM | POA: Diagnosis not present

## 2023-03-15 DIAGNOSIS — J45909 Unspecified asthma, uncomplicated: Secondary | ICD-10-CM | POA: Diagnosis not present

## 2023-03-15 DIAGNOSIS — R7989 Other specified abnormal findings of blood chemistry: Secondary | ICD-10-CM | POA: Diagnosis not present

## 2023-03-15 DIAGNOSIS — N390 Urinary tract infection, site not specified: Secondary | ICD-10-CM | POA: Diagnosis not present

## 2023-03-16 DIAGNOSIS — G9341 Metabolic encephalopathy: Secondary | ICD-10-CM | POA: Diagnosis not present

## 2023-03-16 DIAGNOSIS — R7989 Other specified abnormal findings of blood chemistry: Secondary | ICD-10-CM | POA: Diagnosis not present

## 2023-03-16 DIAGNOSIS — N179 Acute kidney failure, unspecified: Secondary | ICD-10-CM | POA: Diagnosis not present

## 2023-03-16 DIAGNOSIS — R1312 Dysphagia, oropharyngeal phase: Secondary | ICD-10-CM | POA: Diagnosis not present

## 2023-03-16 DIAGNOSIS — E43 Unspecified severe protein-calorie malnutrition: Secondary | ICD-10-CM | POA: Diagnosis not present

## 2023-03-16 DIAGNOSIS — D649 Anemia, unspecified: Secondary | ICD-10-CM | POA: Diagnosis not present

## 2023-03-16 DIAGNOSIS — I10 Essential (primary) hypertension: Secondary | ICD-10-CM | POA: Diagnosis not present

## 2023-03-16 DIAGNOSIS — R2689 Other abnormalities of gait and mobility: Secondary | ICD-10-CM | POA: Diagnosis not present

## 2023-03-16 DIAGNOSIS — N390 Urinary tract infection, site not specified: Secondary | ICD-10-CM | POA: Diagnosis not present

## 2023-03-16 DIAGNOSIS — W19XXXD Unspecified fall, subsequent encounter: Secondary | ICD-10-CM | POA: Diagnosis not present

## 2023-03-16 DIAGNOSIS — M6281 Muscle weakness (generalized): Secondary | ICD-10-CM | POA: Diagnosis not present

## 2023-03-16 DIAGNOSIS — R5381 Other malaise: Secondary | ICD-10-CM | POA: Diagnosis not present

## 2023-03-16 DIAGNOSIS — E860 Dehydration: Secondary | ICD-10-CM | POA: Diagnosis not present

## 2023-03-16 DIAGNOSIS — R41841 Cognitive communication deficit: Secondary | ICD-10-CM | POA: Diagnosis not present

## 2023-03-18 DIAGNOSIS — E860 Dehydration: Secondary | ICD-10-CM | POA: Diagnosis not present

## 2023-03-18 DIAGNOSIS — R5381 Other malaise: Secondary | ICD-10-CM | POA: Diagnosis not present

## 2023-03-18 DIAGNOSIS — N179 Acute kidney failure, unspecified: Secondary | ICD-10-CM | POA: Diagnosis not present

## 2023-03-18 DIAGNOSIS — W19XXXD Unspecified fall, subsequent encounter: Secondary | ICD-10-CM | POA: Diagnosis not present

## 2023-03-18 DIAGNOSIS — M6281 Muscle weakness (generalized): Secondary | ICD-10-CM | POA: Diagnosis not present

## 2023-03-18 DIAGNOSIS — E43 Unspecified severe protein-calorie malnutrition: Secondary | ICD-10-CM | POA: Diagnosis not present

## 2023-03-18 DIAGNOSIS — N390 Urinary tract infection, site not specified: Secondary | ICD-10-CM | POA: Diagnosis not present

## 2023-03-18 DIAGNOSIS — R7989 Other specified abnormal findings of blood chemistry: Secondary | ICD-10-CM | POA: Diagnosis not present

## 2023-03-19 DIAGNOSIS — R5381 Other malaise: Secondary | ICD-10-CM | POA: Diagnosis not present

## 2023-03-19 DIAGNOSIS — N390 Urinary tract infection, site not specified: Secondary | ICD-10-CM | POA: Diagnosis not present

## 2023-03-19 DIAGNOSIS — G9341 Metabolic encephalopathy: Secondary | ICD-10-CM | POA: Diagnosis not present

## 2023-03-27 DIAGNOSIS — N179 Acute kidney failure, unspecified: Secondary | ICD-10-CM | POA: Diagnosis not present

## 2023-03-27 DIAGNOSIS — D649 Anemia, unspecified: Secondary | ICD-10-CM | POA: Diagnosis not present

## 2023-04-04 DIAGNOSIS — R1312 Dysphagia, oropharyngeal phase: Secondary | ICD-10-CM | POA: Diagnosis not present

## 2023-04-05 DIAGNOSIS — R1312 Dysphagia, oropharyngeal phase: Secondary | ICD-10-CM | POA: Diagnosis not present

## 2023-04-06 DIAGNOSIS — R1312 Dysphagia, oropharyngeal phase: Secondary | ICD-10-CM | POA: Diagnosis not present

## 2023-04-09 DIAGNOSIS — R1312 Dysphagia, oropharyngeal phase: Secondary | ICD-10-CM | POA: Diagnosis not present

## 2023-04-10 DIAGNOSIS — I10 Essential (primary) hypertension: Secondary | ICD-10-CM | POA: Diagnosis not present

## 2023-04-10 DIAGNOSIS — R1312 Dysphagia, oropharyngeal phase: Secondary | ICD-10-CM | POA: Diagnosis not present

## 2023-04-11 DIAGNOSIS — R1312 Dysphagia, oropharyngeal phase: Secondary | ICD-10-CM | POA: Diagnosis not present

## 2023-04-12 DIAGNOSIS — R1312 Dysphagia, oropharyngeal phase: Secondary | ICD-10-CM | POA: Diagnosis not present

## 2023-04-13 DIAGNOSIS — R1312 Dysphagia, oropharyngeal phase: Secondary | ICD-10-CM | POA: Diagnosis not present

## 2023-04-16 DIAGNOSIS — R1312 Dysphagia, oropharyngeal phase: Secondary | ICD-10-CM | POA: Diagnosis not present

## 2023-04-17 DIAGNOSIS — R1312 Dysphagia, oropharyngeal phase: Secondary | ICD-10-CM | POA: Diagnosis not present

## 2023-04-18 DIAGNOSIS — R1312 Dysphagia, oropharyngeal phase: Secondary | ICD-10-CM | POA: Diagnosis not present

## 2023-04-19 DIAGNOSIS — N390 Urinary tract infection, site not specified: Secondary | ICD-10-CM | POA: Diagnosis not present

## 2023-04-19 DIAGNOSIS — R1312 Dysphagia, oropharyngeal phase: Secondary | ICD-10-CM | POA: Diagnosis not present

## 2023-04-23 DIAGNOSIS — R1312 Dysphagia, oropharyngeal phase: Secondary | ICD-10-CM | POA: Diagnosis not present

## 2023-04-24 DIAGNOSIS — R1312 Dysphagia, oropharyngeal phase: Secondary | ICD-10-CM | POA: Diagnosis not present

## 2023-04-25 DIAGNOSIS — F0390 Unspecified dementia without behavioral disturbance: Secondary | ICD-10-CM | POA: Diagnosis not present

## 2023-04-25 DIAGNOSIS — R1312 Dysphagia, oropharyngeal phase: Secondary | ICD-10-CM | POA: Diagnosis not present

## 2023-04-26 DIAGNOSIS — R1312 Dysphagia, oropharyngeal phase: Secondary | ICD-10-CM | POA: Diagnosis not present

## 2023-04-27 DIAGNOSIS — M6281 Muscle weakness (generalized): Secondary | ICD-10-CM | POA: Diagnosis not present

## 2023-04-27 DIAGNOSIS — F0390 Unspecified dementia without behavioral disturbance: Secondary | ICD-10-CM | POA: Diagnosis not present

## 2023-04-27 DIAGNOSIS — E43 Unspecified severe protein-calorie malnutrition: Secondary | ICD-10-CM | POA: Diagnosis not present

## 2023-04-27 DIAGNOSIS — R1312 Dysphagia, oropharyngeal phase: Secondary | ICD-10-CM | POA: Diagnosis not present

## 2023-04-27 DIAGNOSIS — R634 Abnormal weight loss: Secondary | ICD-10-CM | POA: Diagnosis not present

## 2023-04-30 DIAGNOSIS — R1312 Dysphagia, oropharyngeal phase: Secondary | ICD-10-CM | POA: Diagnosis not present

## 2023-05-01 DIAGNOSIS — R1312 Dysphagia, oropharyngeal phase: Secondary | ICD-10-CM | POA: Diagnosis not present

## 2023-05-14 ENCOUNTER — Telehealth: Payer: Self-pay | Admitting: Nurse Practitioner

## 2023-05-14 NOTE — Telephone Encounter (Signed)
Spoke to [employer] at [employer] - verified that last MAWV was 07/26/22 and she is scheduled for next MAWV on 08/01/2023

## 2023-05-14 NOTE — Telephone Encounter (Signed)
DEBORAH NEEDS TO KNOW IF PATIENT IS CURRENT WITH HER ANNUAL WELLNESS VISIT I DID NOT SEE WHERE PATIENT HAD A ANNUAL VISIT I SEE WHERE ONE IS SCHEDULED FOR PATIENT FOR NEXT YEAR DEBORAH WOULD LIKE A CALLBACK REGARDING THIS

## 2023-05-23 DIAGNOSIS — F0390 Unspecified dementia without behavioral disturbance: Secondary | ICD-10-CM | POA: Diagnosis not present

## 2023-05-28 DIAGNOSIS — F02818 Dementia in other diseases classified elsewhere, unspecified severity, with other behavioral disturbance: Secondary | ICD-10-CM | POA: Diagnosis not present

## 2023-05-28 DIAGNOSIS — G9341 Metabolic encephalopathy: Secondary | ICD-10-CM | POA: Diagnosis not present

## 2023-05-28 DIAGNOSIS — M6281 Muscle weakness (generalized): Secondary | ICD-10-CM | POA: Diagnosis not present

## 2023-05-28 DIAGNOSIS — R630 Anorexia: Secondary | ICD-10-CM | POA: Diagnosis not present

## 2023-05-28 DIAGNOSIS — F32A Depression, unspecified: Secondary | ICD-10-CM | POA: Diagnosis not present

## 2023-05-28 DIAGNOSIS — E46 Unspecified protein-calorie malnutrition: Secondary | ICD-10-CM | POA: Diagnosis not present

## 2023-05-28 DIAGNOSIS — Z7409 Other reduced mobility: Secondary | ICD-10-CM | POA: Diagnosis not present

## 2023-05-28 DIAGNOSIS — R634 Abnormal weight loss: Secondary | ICD-10-CM | POA: Diagnosis not present

## 2023-05-28 DIAGNOSIS — E43 Unspecified severe protein-calorie malnutrition: Secondary | ICD-10-CM | POA: Diagnosis not present

## 2023-06-01 DIAGNOSIS — Z515 Encounter for palliative care: Secondary | ICD-10-CM | POA: Diagnosis not present

## 2023-06-13 DIAGNOSIS — R278 Other lack of coordination: Secondary | ICD-10-CM | POA: Diagnosis not present

## 2023-06-13 DIAGNOSIS — M6281 Muscle weakness (generalized): Secondary | ICD-10-CM | POA: Diagnosis not present

## 2023-06-14 DIAGNOSIS — M6281 Muscle weakness (generalized): Secondary | ICD-10-CM | POA: Diagnosis not present

## 2023-06-14 DIAGNOSIS — R278 Other lack of coordination: Secondary | ICD-10-CM | POA: Diagnosis not present

## 2023-06-15 DIAGNOSIS — R278 Other lack of coordination: Secondary | ICD-10-CM | POA: Diagnosis not present

## 2023-06-15 DIAGNOSIS — M6281 Muscle weakness (generalized): Secondary | ICD-10-CM | POA: Diagnosis not present

## 2023-06-16 DIAGNOSIS — M6281 Muscle weakness (generalized): Secondary | ICD-10-CM | POA: Diagnosis not present

## 2023-06-16 DIAGNOSIS — R278 Other lack of coordination: Secondary | ICD-10-CM | POA: Diagnosis not present

## 2023-06-18 DIAGNOSIS — R278 Other lack of coordination: Secondary | ICD-10-CM | POA: Diagnosis not present

## 2023-06-18 DIAGNOSIS — M6281 Muscle weakness (generalized): Secondary | ICD-10-CM | POA: Diagnosis not present

## 2023-06-19 DIAGNOSIS — M6281 Muscle weakness (generalized): Secondary | ICD-10-CM | POA: Diagnosis not present

## 2023-06-19 DIAGNOSIS — R278 Other lack of coordination: Secondary | ICD-10-CM | POA: Diagnosis not present

## 2023-06-20 DIAGNOSIS — M6281 Muscle weakness (generalized): Secondary | ICD-10-CM | POA: Diagnosis not present

## 2023-06-20 DIAGNOSIS — F0390 Unspecified dementia without behavioral disturbance: Secondary | ICD-10-CM | POA: Diagnosis not present

## 2023-06-20 DIAGNOSIS — R278 Other lack of coordination: Secondary | ICD-10-CM | POA: Diagnosis not present

## 2023-06-21 DIAGNOSIS — M6281 Muscle weakness (generalized): Secondary | ICD-10-CM | POA: Diagnosis not present

## 2023-06-21 DIAGNOSIS — R278 Other lack of coordination: Secondary | ICD-10-CM | POA: Diagnosis not present

## 2023-06-22 DIAGNOSIS — M6281 Muscle weakness (generalized): Secondary | ICD-10-CM | POA: Diagnosis not present

## 2023-06-22 DIAGNOSIS — R278 Other lack of coordination: Secondary | ICD-10-CM | POA: Diagnosis not present

## 2023-06-25 DIAGNOSIS — R278 Other lack of coordination: Secondary | ICD-10-CM | POA: Diagnosis not present

## 2023-06-25 DIAGNOSIS — M6281 Muscle weakness (generalized): Secondary | ICD-10-CM | POA: Diagnosis not present

## 2023-06-26 DIAGNOSIS — M6281 Muscle weakness (generalized): Secondary | ICD-10-CM | POA: Diagnosis not present

## 2023-06-26 DIAGNOSIS — R278 Other lack of coordination: Secondary | ICD-10-CM | POA: Diagnosis not present

## 2023-06-27 DIAGNOSIS — R278 Other lack of coordination: Secondary | ICD-10-CM | POA: Diagnosis not present

## 2023-06-27 DIAGNOSIS — M6281 Muscle weakness (generalized): Secondary | ICD-10-CM | POA: Diagnosis not present

## 2023-07-13 DIAGNOSIS — Z7409 Other reduced mobility: Secondary | ICD-10-CM | POA: Diagnosis not present

## 2023-07-13 DIAGNOSIS — E43 Unspecified severe protein-calorie malnutrition: Secondary | ICD-10-CM | POA: Diagnosis not present

## 2023-07-13 DIAGNOSIS — F02818 Dementia in other diseases classified elsewhere, unspecified severity, with other behavioral disturbance: Secondary | ICD-10-CM | POA: Diagnosis not present

## 2023-07-13 DIAGNOSIS — Z515 Encounter for palliative care: Secondary | ICD-10-CM | POA: Diagnosis not present

## 2023-07-13 DIAGNOSIS — M6281 Muscle weakness (generalized): Secondary | ICD-10-CM | POA: Diagnosis not present

## 2023-07-13 DIAGNOSIS — R630 Anorexia: Secondary | ICD-10-CM | POA: Diagnosis not present

## 2023-07-13 DIAGNOSIS — R634 Abnormal weight loss: Secondary | ICD-10-CM | POA: Diagnosis not present

## 2023-07-18 DIAGNOSIS — F0390 Unspecified dementia without behavioral disturbance: Secondary | ICD-10-CM | POA: Diagnosis not present

## 2023-08-08 DIAGNOSIS — H524 Presbyopia: Secondary | ICD-10-CM | POA: Diagnosis not present

## 2023-08-08 DIAGNOSIS — H0100B Unspecified blepharitis left eye, upper and lower eyelids: Secondary | ICD-10-CM | POA: Diagnosis not present

## 2023-08-08 DIAGNOSIS — H18413 Arcus senilis, bilateral: Secondary | ICD-10-CM | POA: Diagnosis not present

## 2023-08-08 DIAGNOSIS — H0100A Unspecified blepharitis right eye, upper and lower eyelids: Secondary | ICD-10-CM | POA: Diagnosis not present

## 2023-08-08 DIAGNOSIS — Z961 Presence of intraocular lens: Secondary | ICD-10-CM | POA: Diagnosis not present

## 2023-08-15 DIAGNOSIS — F0390 Unspecified dementia without behavioral disturbance: Secondary | ICD-10-CM | POA: Diagnosis not present

## 2023-08-20 DIAGNOSIS — E46 Unspecified protein-calorie malnutrition: Secondary | ICD-10-CM | POA: Diagnosis not present

## 2023-08-20 DIAGNOSIS — F32A Depression, unspecified: Secondary | ICD-10-CM | POA: Diagnosis not present

## 2023-08-20 DIAGNOSIS — G9341 Metabolic encephalopathy: Secondary | ICD-10-CM | POA: Diagnosis not present

## 2023-10-31 DIAGNOSIS — F0390 Unspecified dementia without behavioral disturbance: Secondary | ICD-10-CM | POA: Diagnosis not present

## 2023-11-05 DIAGNOSIS — F0390 Unspecified dementia without behavioral disturbance: Secondary | ICD-10-CM | POA: Diagnosis not present

## 2023-11-05 DIAGNOSIS — E43 Unspecified severe protein-calorie malnutrition: Secondary | ICD-10-CM | POA: Diagnosis not present

## 2023-11-05 DIAGNOSIS — G9341 Metabolic encephalopathy: Secondary | ICD-10-CM | POA: Diagnosis not present

## 2024-02-13 DIAGNOSIS — F0390 Unspecified dementia without behavioral disturbance: Secondary | ICD-10-CM | POA: Diagnosis not present

## 2024-02-28 DIAGNOSIS — F03911 Unspecified dementia, unspecified severity, with agitation: Secondary | ICD-10-CM | POA: Diagnosis not present

## 2024-02-28 DIAGNOSIS — F411 Generalized anxiety disorder: Secondary | ICD-10-CM | POA: Diagnosis not present

## 2024-03-05 DIAGNOSIS — F03911 Unspecified dementia, unspecified severity, with agitation: Secondary | ICD-10-CM | POA: Diagnosis not present

## 2024-03-05 DIAGNOSIS — F411 Generalized anxiety disorder: Secondary | ICD-10-CM | POA: Diagnosis not present

## 2024-03-19 DIAGNOSIS — F03911 Unspecified dementia, unspecified severity, with agitation: Secondary | ICD-10-CM | POA: Diagnosis not present

## 2024-03-19 DIAGNOSIS — F411 Generalized anxiety disorder: Secondary | ICD-10-CM | POA: Diagnosis not present

## 2024-05-14 DIAGNOSIS — F411 Generalized anxiety disorder: Secondary | ICD-10-CM | POA: Diagnosis not present

## 2024-05-14 DIAGNOSIS — F03911 Unspecified dementia, unspecified severity, with agitation: Secondary | ICD-10-CM | POA: Diagnosis not present

## 2024-08-10 DEATH — deceased
